# Patient Record
Sex: Male | Born: 1935 | Hispanic: No | Smoking: Former smoker
Health system: Southern US, Community
[De-identification: ages and names within clinical notes are randomized; demographics above are authoritative.]

## PROBLEM LIST (undated history)

## (undated) DIAGNOSIS — I771 Stricture of artery: Secondary | ICD-10-CM

## (undated) DIAGNOSIS — C439 Malignant melanoma of skin, unspecified: Secondary | ICD-10-CM

## (undated) DIAGNOSIS — I10 Essential (primary) hypertension: Secondary | ICD-10-CM

## (undated) DIAGNOSIS — R32 Unspecified urinary incontinence: Secondary | ICD-10-CM

## (undated) DIAGNOSIS — M1990 Unspecified osteoarthritis, unspecified site: Secondary | ICD-10-CM

## (undated) DIAGNOSIS — J449 Chronic obstructive pulmonary disease, unspecified: Secondary | ICD-10-CM

## (undated) DIAGNOSIS — H9190 Unspecified hearing loss, unspecified ear: Secondary | ICD-10-CM

## (undated) DIAGNOSIS — H409 Unspecified glaucoma: Secondary | ICD-10-CM

## (undated) DIAGNOSIS — E119 Type 2 diabetes mellitus without complications: Secondary | ICD-10-CM

## (undated) DIAGNOSIS — L03115 Cellulitis of right lower limb: Secondary | ICD-10-CM

## (undated) DIAGNOSIS — C4362 Malignant melanoma of left upper limb, including shoulder: Secondary | ICD-10-CM

## (undated) DIAGNOSIS — E785 Hyperlipidemia, unspecified: Secondary | ICD-10-CM

## (undated) DIAGNOSIS — N2581 Secondary hyperparathyroidism of renal origin: Secondary | ICD-10-CM

## (undated) DIAGNOSIS — E041 Nontoxic single thyroid nodule: Secondary | ICD-10-CM

## (undated) DIAGNOSIS — L02412 Cutaneous abscess of left axilla: Secondary | ICD-10-CM

## (undated) DIAGNOSIS — N400 Enlarged prostate without lower urinary tract symptoms: Secondary | ICD-10-CM

## (undated) DIAGNOSIS — N183 Chronic kidney disease, stage 3 unspecified: Secondary | ICD-10-CM

## (undated) DIAGNOSIS — D472 Monoclonal gammopathy: Secondary | ICD-10-CM

## (undated) HISTORY — DX: Nontoxic single thyroid nodule: E04.1

## (undated) HISTORY — DX: Malignant melanoma of skin, unspecified: C43.9

## (undated) HISTORY — DX: Cutaneous abscess of left axilla: L02.412

## (undated) HISTORY — DX: Stricture of artery: I77.1

## (undated) HISTORY — PX: OTHER SURGICAL HISTORY: SHX169

## (undated) HISTORY — DX: Unspecified urinary incontinence: R32

## (undated) HISTORY — DX: Essential (primary) hypertension: I10

## (undated) HISTORY — DX: Secondary hyperparathyroidism of renal origin: N25.81

## (undated) HISTORY — DX: Unspecified glaucoma: H40.9

## (undated) HISTORY — DX: Chronic kidney disease, stage 3 (moderate): N18.3

## (undated) HISTORY — DX: Chronic obstructive pulmonary disease, unspecified: J44.9

## (undated) HISTORY — DX: Unspecified hearing loss, unspecified ear: H91.90

## (undated) HISTORY — DX: Monoclonal gammopathy: D47.2

## (undated) HISTORY — DX: Benign prostatic hyperplasia without lower urinary tract symptoms: N40.0

## (undated) HISTORY — DX: Type 2 diabetes mellitus without complications: E11.9

## (undated) HISTORY — DX: Chronic kidney disease, stage 3 unspecified: N18.30

## (undated) HISTORY — DX: Cellulitis of right lower limb: L03.115

## (undated) HISTORY — DX: Unspecified osteoarthritis, unspecified site: M19.90

## (undated) HISTORY — DX: Hyperlipidemia, unspecified: E78.5

## (undated) HISTORY — DX: Malignant melanoma of left upper limb, including shoulder: C43.62

---
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
## 2007-06-29 HISTORY — PX: US ECHOCARDIOGRAPHY: HXRAD669

## 2007-07-29 HISTORY — PX: OTHER SURGICAL HISTORY: SHX169

## 2009-01-31 ENCOUNTER — Ambulatory Visit: Payer: Self-pay | Admitting: Ophthalmology

## 2009-01-31 ENCOUNTER — Ambulatory Visit: Payer: Self-pay | Admitting: Cardiology

## 2009-02-11 ENCOUNTER — Ambulatory Visit: Payer: Self-pay | Admitting: Ophthalmology

## 2011-06-24 ENCOUNTER — Inpatient Hospital Stay: Payer: Self-pay | Admitting: Internal Medicine

## 2011-06-24 LAB — COMPREHENSIVE METABOLIC PANEL
Albumin: 3.1 g/dL — ABNORMAL LOW (ref 3.4–5.0)
Alkaline Phosphatase: 63 U/L (ref 50–136)
BUN: 50 mg/dL — ABNORMAL HIGH (ref 7–18)
Chloride: 114 mmol/L — ABNORMAL HIGH (ref 98–107)
Creatinine: 2.06 mg/dL — ABNORMAL HIGH (ref 0.60–1.30)
EGFR (African American): 40 — ABNORMAL LOW
EGFR (Non-African Amer.): 33 — ABNORMAL LOW
Osmolality: 296 (ref 275–301)
SGOT(AST): 30 U/L (ref 15–37)
SGPT (ALT): 34 U/L
Sodium: 144 mmol/L (ref 136–145)

## 2011-06-24 LAB — CBC
HCT: 32.1 % — ABNORMAL LOW (ref 40.0–52.0)
HGB: 10.6 g/dL — ABNORMAL LOW (ref 13.0–18.0)
MCH: 31.5 pg (ref 26.0–34.0)
MCHC: 33.1 g/dL (ref 32.0–36.0)
MCV: 95 fL (ref 80–100)
RBC: 3.36 10*6/uL — ABNORMAL LOW (ref 4.40–5.90)
RDW: 14.2 % (ref 11.5–14.5)

## 2011-06-24 LAB — URINALYSIS, COMPLETE
Ketone: NEGATIVE
Leukocyte Esterase: NEGATIVE
Nitrite: NEGATIVE
Ph: 5 (ref 4.5–8.0)
WBC UR: 2 /HPF (ref 0–5)

## 2011-06-25 LAB — BASIC METABOLIC PANEL
Anion Gap: 12 (ref 7–16)
Anion Gap: 14 (ref 7–16)
BUN: 45 mg/dL — ABNORMAL HIGH (ref 7–18)
BUN: 38 mg/dL — ABNORMAL HIGH (ref 7–18)
Calcium, Total: 7.5 mg/dL — ABNORMAL LOW (ref 8.5–10.1)
Calcium, Total: 7.8 mg/dL — ABNORMAL LOW (ref 8.5–10.1)
Co2: 15 mmol/L — ABNORMAL LOW (ref 21–32)
Creatinine: 1.9 mg/dL — ABNORMAL HIGH (ref 0.60–1.30)
EGFR (African American): 44 — ABNORMAL LOW
EGFR (African American): 43 — ABNORMAL LOW
Glucose: 36 mg/dL — CL (ref 65–99)
Glucose: 69 mg/dL (ref 65–99)
Osmolality: 295 (ref 275–301)
Osmolality: 289 (ref 275–301)
Potassium: 3 mmol/L — ABNORMAL LOW (ref 3.5–5.1)
Potassium: 3.4 mmol/L — ABNORMAL LOW (ref 3.5–5.1)
Sodium: 144 mmol/L (ref 136–145)
Sodium: 141 mmol/L (ref 136–145)

## 2011-06-25 LAB — CBC WITH DIFFERENTIAL/PLATELET
Basophil %: 0.1 %
Eosinophil %: 0.1 %
HCT: 29 % — ABNORMAL LOW (ref 40.0–52.0)
HGB: 9.5 g/dL — ABNORMAL LOW (ref 13.0–18.0)
Lymphocyte #: 0.8 10*3/uL — ABNORMAL LOW (ref 1.0–3.6)
MCH: 31.4 pg (ref 26.0–34.0)
MCV: 96 fL (ref 80–100)
Monocyte #: 0.8 10*3/uL — ABNORMAL HIGH (ref 0.0–0.7)
Monocyte %: 8.6 %
Neutrophil %: 82.2 %
RBC: 3.01 10*6/uL — ABNORMAL LOW (ref 4.40–5.90)
WBC: 9.1 10*3/uL (ref 3.8–10.6)

## 2011-06-25 LAB — MAGNESIUM: Magnesium: 1.4 mg/dL — ABNORMAL LOW

## 2011-06-26 LAB — CBC WITH DIFFERENTIAL/PLATELET
Basophil #: 0 10*3/uL (ref 0.0–0.1)
Eosinophil #: 0.1 10*3/uL (ref 0.0–0.7)
HCT: 29.7 % — ABNORMAL LOW (ref 40.0–52.0)
HGB: 9.7 g/dL — ABNORMAL LOW (ref 13.0–18.0)
Lymphocyte #: 1 10*3/uL (ref 1.0–3.6)
Lymphocyte %: 13.5 %
MCHC: 32.5 g/dL (ref 32.0–36.0)
MCV: 96 fL (ref 80–100)
Monocyte #: 0.8 10*3/uL — ABNORMAL HIGH (ref 0.0–0.7)
Monocyte %: 10.6 %
Neutrophil #: 5.3 10*3/uL (ref 1.4–6.5)
Platelet: 78 10*3/uL — ABNORMAL LOW (ref 150–440)
RBC: 3.08 10*6/uL — ABNORMAL LOW (ref 4.40–5.90)

## 2011-06-26 LAB — BASIC METABOLIC PANEL
BUN: 33 mg/dL — ABNORMAL HIGH (ref 7–18)
Calcium, Total: 7.7 mg/dL — ABNORMAL LOW (ref 8.5–10.1)
Chloride: 107 mmol/L (ref 98–107)
Co2: 17 mmol/L — ABNORMAL LOW (ref 21–32)
Creatinine: 1.74 mg/dL — ABNORMAL HIGH (ref 0.60–1.30)
EGFR (African American): 48 — ABNORMAL LOW
EGFR (Non-African Amer.): 40 — ABNORMAL LOW
Osmolality: 285 (ref 275–301)
Potassium: 4.2 mmol/L (ref 3.5–5.1)
Sodium: 136 mmol/L (ref 136–145)

## 2011-07-13 LAB — COMPREHENSIVE METABOLIC PANEL
ALT: 16 U/L (ref 10–40)
Albumin: 4.2
Calcium: 9.4 mg/dL
Glucose: 87 mg/dL
Potassium: 4.5 mmol/L
Sodium: 145 mmol/L (ref 137–147)

## 2011-07-13 LAB — CBC
Hemoglobin: 10.1 g/dL — AB (ref 13.5–17.5)
WBC: 4.5

## 2011-07-13 LAB — LIPID PANEL: Cholesterol, Total: 111

## 2011-07-13 LAB — HEMOGLOBIN A1C: Hgb A1c MFr Bld: 6.4 % — AB (ref 4.0–6.0)

## 2011-07-13 LAB — TSH: TSH: 1.98

## 2011-09-10 ENCOUNTER — Encounter: Payer: Self-pay | Admitting: Family Medicine

## 2011-09-10 ENCOUNTER — Ambulatory Visit (INDEPENDENT_AMBULATORY_CARE_PROVIDER_SITE_OTHER): Payer: Medicare Other | Admitting: Family Medicine

## 2011-09-10 VITALS — BP 152/70 | HR 64 | Temp 98.4°F | Ht 68.0 in | Wt 200.2 lb

## 2011-09-10 DIAGNOSIS — E1122 Type 2 diabetes mellitus with diabetic chronic kidney disease: Secondary | ICD-10-CM | POA: Insufficient documentation

## 2011-09-10 DIAGNOSIS — E1121 Type 2 diabetes mellitus with diabetic nephropathy: Secondary | ICD-10-CM | POA: Insufficient documentation

## 2011-09-10 DIAGNOSIS — E785 Hyperlipidemia, unspecified: Secondary | ICD-10-CM | POA: Insufficient documentation

## 2011-09-10 DIAGNOSIS — I340 Nonrheumatic mitral (valve) insufficiency: Secondary | ICD-10-CM | POA: Insufficient documentation

## 2011-09-10 DIAGNOSIS — R011 Cardiac murmur, unspecified: Secondary | ICD-10-CM

## 2011-09-10 DIAGNOSIS — I10 Essential (primary) hypertension: Secondary | ICD-10-CM | POA: Insufficient documentation

## 2011-09-10 DIAGNOSIS — E119 Type 2 diabetes mellitus without complications: Secondary | ICD-10-CM

## 2011-09-10 MED ORDER — AMLODIPINE BESYLATE 10 MG PO TABS: 10.0000 mg | ORAL_TABLET | Freq: Every day | ORAL | Status: DC

## 2011-09-10 MED ORDER — LISINOPRIL-HYDROCHLOROTHIAZIDE 20-25 MG PO TABS: 1.0000 | ORAL_TABLET | Freq: Every day | ORAL | Status: DC

## 2011-09-10 MED ORDER — PRAVASTATIN SODIUM 40 MG PO TABS: 40.0000 mg | ORAL_TABLET | Freq: Every day | ORAL | Status: DC

## 2011-09-10 MED ORDER — DOXAZOSIN MESYLATE 8 MG PO TABS: 8.0000 mg | ORAL_TABLET | Freq: Every day | ORAL | Status: DC

## 2011-09-10 MED ORDER — FUROSEMIDE 20 MG PO TABS: 20.0000 mg | ORAL_TABLET | Freq: Every day | ORAL | Status: DC | PRN

## 2011-09-10 MED ORDER — POTASSIUM CHLORIDE ER 10 MEQ PO TBCR: 10.0000 meq | EXTENDED_RELEASE_TABLET | Freq: Every day | ORAL | Status: DC | PRN

## 2011-09-10 MED ORDER — SAXAGLIPTIN HCL 2.5 MG PO TABS: 2.5000 mg | ORAL_TABLET | Freq: Every day | ORAL | Status: DC

## 2011-09-10 NOTE — Assessment & Plan Note (Signed)
Continue meds.  Above goal. Recheck next month.

## 2011-09-10 NOTE — Assessment & Plan Note (Signed)
Will review records. rtc 1 mo for f/u, consider blood work then. Discussed give onglyza more time to work. Samples provided today, sent med to pharmacy. Off sulfonylurea 2/2 hypoglycemia with syncope

## 2011-09-10 NOTE — Assessment & Plan Note (Signed)
Compliant with and tolerant of statin.

## 2011-09-10 NOTE — Progress Notes (Addendum)
Subjective:    Patient ID: Charles Rodgers, male    DOB: 12/13/1924, 78 y.o.   MRN: [id number]  HPI CC: new pt to establish  Previously saw Dr. Glenis Smoker.  Sees Dr. Wynelle Link (nephrologist).  Brings records from [place] which I will review.  DM - Previously on glimeperide.  Sugars running too low on this.  Recently changed to onglyza.  Now running high - 170s.  This morning 132.  Started onglyza 2 wks ago.  HTN - on several meds for this, reports compliance.  HLD - on pravastatin.  Sounds like has stage 3 CKD as well.  Sees Dr. Wynelle Link, next appt 10/2011.  Recent hospitalization to [facility] (have requested records) - 05/2011 for syncope thought due to hypoglycemia (took 2 days to raise sugar).  Had had prior syncopal episodes (ie when walking dog, bent over and passed out, another episode with hypoglycemia cbg to 38, again fell 2/2 hypoglycemia). ===>reviewed hospitalization - 3/27-29/2013 at [facility] for acute gastroenteritis, dehydration with fall, acute on chronic renal failure, hypoglycemia.  Renal US - medical renal disease.  Prominent prostate.  EKG with first degree block.  cbg 30.  Cr 2.06 which dropped to 1.74.  Hgb 10.6.  Preventative: Last CPE was - unsure. Sees urologist yearly.  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  Past histories reviewed and updated if relevant as below. There is no problem list on file for this patient.  Past Medical History  Diagnosis Date  . Arthritis   . Diabetes mellitus type 2, controlled   . Glaucoma   . HTN (hypertension)   . HLD (hyperlipidemia)   . CKD (chronic kidney disease) stage 3, GFR 30-59 ml/min   . Urine incontinence    Past Surgical History  Procedure Date  . Appendectomy 1938  . Arm surgery     Right after trauma   History  Substance Use  Topics  . Smoking status: Former Smoker    Quit date: 03/31/1967  . Smokeless tobacco: Never Used  . Alcohol Use: No   Family History  Problem Relation Age of Onset  . Cancer Mother     stomach  . Cancer Brother     colon  . Cancer Brother     colon  . Cancer Sister     hodgkin's lymphoma  . Cancer Brother     prostate  . Diabetes Brother   . Coronary artery disease Neg Hx   . Stroke Neg Hx    No Known Allergies Current Outpatient Prescriptions on File Prior to Visit  Medication Sig Dispense Refill  . amLODipine (NORVASC) 10 MG tablet Take 1 tablet (10 mg total) by mouth daily.  90 tablet  3  . doxazosin (CARDURA) 8 MG tablet Take 1 tablet (8 mg total) by mouth at bedtime.  90 tablet  3  . furosemide (LASIX) 20 MG tablet Take 1 tablet (20 mg total) by mouth daily as needed. swelling  30 tablet  3  . lisinopril-hydrochlorothiazide (PRINZIDE,ZESTORETIC) 20-25 MG per tablet Take 1 tablet by mouth daily.  90 tablet  3  . potassium chloride (K-DUR) 10 MEQ tablet Take 1 tablet (10 mEq total) by mouth daily as needed. Take with lasix.  30 tablet  1  . pravastatin (PRAVACHOL) 40 MG tablet Take 1 tablet (40 mg total) by mouth at bedtime.  90  tablet  3  . saxagliptin HCl (ONGLYZA) 2.5 MG TABS tablet Take 1 tablet (2.5 mg total) by mouth daily.  30 tablet  11     Review of Systems  Constitutional: Negative for fever, chills, activity change, appetite change, fatigue and unexpected weight change.  HENT: Negative for hearing loss and neck pain.   Eyes: Negative for visual disturbance.  Respiratory: Negative for cough, chest tightness, shortness of breath and wheezing.   Cardiovascular: Positive for leg swelling. Negative for chest pain and palpitations.  Gastrointestinal: Negative for nausea, vomiting, abdominal pain, diarrhea, constipation, blood in stool and abdominal distention.  Genitourinary: Negative for hematuria and difficulty urinating.  Musculoskeletal: Negative for myalgias  and arthralgias.  Skin: Negative for rash.  Neurological: Positive for syncope. Negative for dizziness, seizures and headaches.  Hematological: Does not bruise/bleed easily.  Psychiatric/Behavioral: Negative for dysphoric mood. The patient is not nervous/anxious.        Objective:   Physical Exam  Nursing note and vitals reviewed. Constitutional: He is oriented to person, place, and time. He appears well-developed and well-nourished. No distress.  HENT:  Head: Normocephalic and atraumatic.  Right Ear: External ear normal.  Left Ear: External ear normal.  Nose: Nose normal.  Mouth/Throat: Oropharynx is clear and moist. No oropharyngeal exudate.  Eyes: Conjunctivae and EOM are normal. Pupils are equal, round, and reactive to light. No scleral icterus.  Neck: Normal range of motion. Neck supple. No thyromegaly present.  Cardiovascular: Normal rate, regular rhythm and intact distal pulses.   Murmur (3/6 SEM best at LUSB, radiates to carotids) heard. Pulses:      Radial pulses are 2+ on the right side, and 2+ on the left side.  Pulmonary/Chest: Effort normal and breath sounds normal. No respiratory distress. He has no wheezes. He has no rales.  Abdominal: Soft. Bowel sounds are normal. He exhibits no distension and no mass. There is no tenderness. There is no rebound and no guarding.  Musculoskeletal: Normal range of motion. He exhibits edema (1+ pitting bilaterally).  Lymphadenopathy:    He has no cervical adenopathy.  Neurological: He is alert and oriented to person, place, and time.       CN grossly intact, station and gait intact  Skin: Skin is warm and dry. No rash noted.  Psychiatric: He has a normal mood and affect. His behavior is normal. Judgment and thought content normal.       Assessment & Plan:

## 2011-09-10 NOTE — Assessment & Plan Note (Signed)
Unsure if evaluated in past.  Will review records he brings today. If no h/o echo, will obtain (in setting of syncope, however anticipate syncope due to hypoglycemia)

## 2011-09-10 NOTE — Patient Instructions (Signed)
Good to meet you today!  Call us with questions. I will review records and call you when ready to pick up. No changes in meds today. I've refilled all your meds today. You do have a bit of a heart murmur today, if continued next visit, we will order ultrasound.

## 2011-09-10 NOTE — Assessment & Plan Note (Signed)
In setting of recent hospitalization with ARF. Will review records and input into chart.. Sees Dr. Wynelle Link regularly

## 2011-09-12 ENCOUNTER — Encounter: Payer: Self-pay | Admitting: Family Medicine

## 2011-09-17 ENCOUNTER — Encounter: Payer: Self-pay | Admitting: Family Medicine

## 2011-10-13 ENCOUNTER — Ambulatory Visit (INDEPENDENT_AMBULATORY_CARE_PROVIDER_SITE_OTHER): Payer: Medicare Other | Admitting: Family Medicine

## 2011-10-13 ENCOUNTER — Encounter: Payer: Self-pay | Admitting: Family Medicine

## 2011-10-13 VITALS — BP 140/70 | HR 68 | Temp 98.3°F | Wt 194.5 lb

## 2011-10-13 DIAGNOSIS — R011 Cardiac murmur, unspecified: Secondary | ICD-10-CM

## 2011-10-13 DIAGNOSIS — E785 Hyperlipidemia, unspecified: Secondary | ICD-10-CM

## 2011-10-13 DIAGNOSIS — E119 Type 2 diabetes mellitus without complications: Secondary | ICD-10-CM

## 2011-10-13 DIAGNOSIS — Z23 Encounter for immunization: Secondary | ICD-10-CM

## 2011-10-13 DIAGNOSIS — N183 Chronic kidney disease, stage 3 unspecified: Secondary | ICD-10-CM

## 2011-10-13 DIAGNOSIS — I10 Essential (primary) hypertension: Secondary | ICD-10-CM

## 2011-10-13 LAB — MICROALBUMIN / CREATININE URINE RATIO: Microalb Creat Ratio: 1 mg/g (ref 0.0–30.0)

## 2011-10-13 NOTE — Assessment & Plan Note (Signed)
Chronic, stable. Continue med. 

## 2011-10-13 NOTE — Assessment & Plan Note (Signed)
Not significant today.  Continue to monitor.

## 2011-10-13 NOTE — Assessment & Plan Note (Signed)
Followed by renal.  Has appt pending next month.  Will fax blood work results to [facility].

## 2011-10-13 NOTE — Addendum Note (Signed)
Addended by: Josph Macho A on: 10/13/2011 01:51 PM   Modules accepted: Orders

## 2011-10-13 NOTE — Assessment & Plan Note (Signed)
Chronic, improved. Continue med. Given swelling, advised to take lasix daily with potassium.  Check BMP today.

## 2011-10-13 NOTE — Patient Instructions (Signed)
Change lasix to daily, take with 10 mEq of potassium daily as well. Blood work today to check on sugar. Good to see you today, call us with quesitons. Return in

## 2011-10-13 NOTE — Assessment & Plan Note (Signed)
Chronic. Anticipate deteriorated given off sulfonylurea 2/2 hypoglycemic syncope Not metformin candidate 2/2 CKD. Sees podiatrist and ophtho regularly. Check A1c today.

## 2011-10-13 NOTE — Progress Notes (Signed)
  Subjective:    Patient ID: Charles Rodgers, male    DOB: 1925-03-29, 78 y.o.   MRN: [id number]  HPI CC: 1 mo f/u  DM - at night running as high as 200, then in am about 130-140.  Sees eye doctor every 6 mo, due 10/21/2011.  No low sugars.  No paresthesias.  Sees podiatrist (Dr. Kennith Center).  Pending appt this month. Lab Results  Component Value Date   HGBA1C 6.4* 07/13/2011    BPH - nocturia x1-2.  Voiding well.  Takes doxasozin daily.  HTN - No HA, vision changes, CP/tightness, SOB, leg swelling.  Compliant with meds.  HLD - compliant with pravastatin, no myalgias.  Cardiac murmur - some weakness with exertion.  No chest pain or shortness of breath.  No recent echo.  CKD - sees Dr. Wynelle Link.  F/u in August.  Past Medical History  Diagnosis Date  . Arthritis   . Diabetes mellitus type 2, controlled   . Glaucoma   . HTN (hypertension)   . HLD (hyperlipidemia)   . CKD (chronic kidney disease) stage 3, GFR 30-59 ml/min   . Urine incontinence   . BPH (benign prostatic hypertrophy)     s/p biopsy for possible prostate cancer, followed yearly by [facility]   Denies falls in last year.  Did have passing out episodes recently, attributed to hypoglycemia.  Resolved with looser glycemic control. Denies depression, anhedonia.  Review of Systems Per HPI    Objective:   Physical Exam  Nursing note and vitals reviewed. Constitutional: He appears well-developed and well-nourished. No distress.  HENT:  Head: Normocephalic and atraumatic.  Mouth/Throat: Oropharynx is clear and moist. No oropharyngeal exudate.  Eyes: Conjunctivae and EOM are normal. Pupils are equal, round, and reactive to light.  Neck: Normal range of motion. Neck supple. Carotid bruit is not present.  Cardiovascular: Normal rate, regular rhythm, normal heart sounds and intact distal pulses.   No murmur heard.      No murmur appreciated today  Pulmonary/Chest: Effort normal and breath sounds normal. No respiratory  distress. He has no wheezes. He has no rales.  Musculoskeletal: He exhibits edema (trace pitting ).  Lymphadenopathy:    He has no cervical adenopathy.  Skin: Skin is warm and dry. No rash noted.  Psychiatric: He has a normal mood and affect.      Assessment & Plan:

## 2011-10-14 LAB — BASIC METABOLIC PANEL
BUN: 39 mg/dL — ABNORMAL HIGH (ref 6–23)
CO2: 20 mEq/L (ref 19–32)
Calcium: 9.4 mg/dL (ref 8.4–10.5)
Creatinine, Ser: 1.9 mg/dL — ABNORMAL HIGH (ref 0.4–1.5)
GFR: 35.99 mL/min — ABNORMAL LOW (ref >60.00)
Glucose, Bld: 118 mg/dL — ABNORMAL HIGH (ref 70–99)
Sodium: 139 mEq/L (ref 135–145)

## 2011-10-20 ENCOUNTER — Telehealth: Payer: Self-pay | Admitting: *Deleted

## 2011-10-20 NOTE — Telephone Encounter (Signed)
Patient called and asked that I remind you about his K+ pill. He said he passes it in his stool and he is concerned that it is still appearing whole. Any suggestions? He mentioned it at his appt, but then got sidetracked talking about something else with you and forgot to mention it again.

## 2011-10-22 NOTE — Telephone Encounter (Signed)
On lasix 20mg  daily, on potassium 10 mEq daily. Lab Results  Component Value Date   K 5.0 10/13/2011   Lab Results  Component Value Date   CREATININE 1.9* 10/13/2011   is his potassium a capsule?  If so, sometimes you can see casing of pills in stool, but medicine in them has already been absorbed.  So I wouldn't worry about this.

## 2011-10-22 NOTE — Telephone Encounter (Signed)
Spoke with patient and he said he is taking a tablet, not a capsule. Anymore ideas?

## 2011-10-23 NOTE — Telephone Encounter (Signed)
This can happen with tablets as well.  I wouldn't worry about this.  Something called "ghost tablets".

## 2011-10-26 NOTE — Telephone Encounter (Signed)
Patient notified

## 2011-11-12 ENCOUNTER — Encounter: Payer: Self-pay | Admitting: Family Medicine

## 2012-01-15 ENCOUNTER — Encounter: Payer: Self-pay | Admitting: Family Medicine

## 2012-01-15 ENCOUNTER — Ambulatory Visit (INDEPENDENT_AMBULATORY_CARE_PROVIDER_SITE_OTHER): Payer: Medicare Other | Admitting: Family Medicine

## 2012-01-15 VITALS — BP 124/66 | HR 68 | Temp 97.7°F | Wt 200.8 lb

## 2012-01-15 DIAGNOSIS — N401 Enlarged prostate with lower urinary tract symptoms: Secondary | ICD-10-CM | POA: Insufficient documentation

## 2012-01-15 DIAGNOSIS — E1142 Type 2 diabetes mellitus with diabetic polyneuropathy: Secondary | ICD-10-CM

## 2012-01-15 DIAGNOSIS — N400 Enlarged prostate without lower urinary tract symptoms: Secondary | ICD-10-CM

## 2012-01-15 DIAGNOSIS — Z23 Encounter for immunization: Secondary | ICD-10-CM

## 2012-01-15 DIAGNOSIS — H9190 Unspecified hearing loss, unspecified ear: Secondary | ICD-10-CM

## 2012-01-15 DIAGNOSIS — E1149 Type 2 diabetes mellitus with other diabetic neurological complication: Secondary | ICD-10-CM

## 2012-01-15 DIAGNOSIS — I10 Essential (primary) hypertension: Secondary | ICD-10-CM

## 2012-01-15 DIAGNOSIS — N183 Chronic kidney disease, stage 3 unspecified: Secondary | ICD-10-CM

## 2012-01-15 DIAGNOSIS — E785 Hyperlipidemia, unspecified: Secondary | ICD-10-CM

## 2012-01-15 LAB — BASIC METABOLIC PANEL
BUN: 36 mg/dL — ABNORMAL HIGH (ref 6–23)
Chloride: 108 mEq/L (ref 96–112)
GFR: 37.8 mL/min — ABNORMAL LOW (ref >60.00)
Glucose, Bld: 115 mg/dL — ABNORMAL HIGH (ref 70–99)
Potassium: 4.6 mEq/L (ref 3.5–5.1)
Sodium: 139 mEq/L (ref 135–145)

## 2012-01-15 NOTE — Assessment & Plan Note (Signed)
Chronic, stable. Great control - continue meds. 

## 2012-01-15 NOTE — Assessment & Plan Note (Signed)
Chronic, stable.    Lab Results  Component Value Date   HGBA1C 6.8* 10/13/2011  Continue meds. Sees podiatry and ophtho regularly.

## 2012-01-15 NOTE — Progress Notes (Signed)
  Subjective:    Patient ID: Charles Rodgers, male    DOB: Dec 03, 1924, 78 y.o.   MRN: [id number]  HPI CC: 3 mo f/u  DM - this morning fasting 114.  Night time sugars 160s.  No low sugars.  No hypoglycemic sxs.  Denies paresthesias.  ophtho exam 11/2011.  Foot exam today.  Sees podiatrist every 3 months. Lab Results  Component Value Date   HGBA1C 6.8* 10/13/2011    HTN - great control on current regimen.  No HA, vision changes, CP/tightness, SOB, leg swelling.   HLD - no myalgias.  CKD - sees Dr. Wynelle Link for stage 3 CKD.  Flu and tetanus shot today.  Review of Systems Per HPI    Objective:   Physical Exam  Nursing note and vitals reviewed. Constitutional: He appears well-developed and well-nourished. No distress.  HENT:  Head: Normocephalic and atraumatic.  Right Ear: External ear normal.  Left Ear: External ear normal.  Nose: Nose normal.  Mouth/Throat: Oropharynx is clear and moist. No oropharyngeal exudate.  Eyes: Conjunctivae normal and EOM are normal. Pupils are equal, round, and reactive to light. No scleral icterus.  Neck: Normal range of motion. Neck supple. Carotid bruit is not present.  Cardiovascular: Normal rate, regular rhythm, normal heart sounds and intact distal pulses.   No murmur heard. Pulmonary/Chest: Effort normal and breath sounds normal. No respiratory distress. He has no wheezes. He has no rales.  Musculoskeletal: He exhibits no edema.       Diabetic foot exam: Normal inspection No skin breakdown No calluses  Normal DP/PT pulses Normal sensation to light touch and diminished to monofilament Nails elongated  Lymphadenopathy:    He has no cervical adenopathy.  Skin: Skin is warm and dry. No rash noted.  Psychiatric: He has a normal mood and affect.       Assessment & Plan:

## 2012-01-15 NOTE — Assessment & Plan Note (Signed)
Recheck Cr today.  Sees [facility] regularly.

## 2012-01-15 NOTE — Assessment & Plan Note (Signed)
Chronic. No myalgias. continue med.

## 2012-01-15 NOTE — Patient Instructions (Addendum)
Flu shot today. As well as tetanus. Blood work today. We will check on samples for onglyza Return in 4 months for medicare wellness, prior fasting for blood work.

## 2012-01-15 NOTE — Addendum Note (Signed)
Addended by: Josph Macho A on: 01/15/2012 09:42 AM   Modules accepted: Orders

## 2012-03-30 DIAGNOSIS — C439 Malignant melanoma of skin, unspecified: Secondary | ICD-10-CM

## 2012-03-30 DIAGNOSIS — I771 Stricture of artery: Secondary | ICD-10-CM

## 2012-03-30 HISTORY — DX: Stricture of artery: I77.1

## 2012-03-30 HISTORY — DX: Malignant melanoma of skin, unspecified: C43.9

## 2012-03-31 ENCOUNTER — Encounter: Payer: Self-pay | Admitting: Family Medicine

## 2012-03-31 ENCOUNTER — Ambulatory Visit (INDEPENDENT_AMBULATORY_CARE_PROVIDER_SITE_OTHER): Payer: Medicare Other | Admitting: Family Medicine

## 2012-03-31 VITALS — BP 122/58 | HR 64 | Temp 98.5°F | Wt 201.0 lb

## 2012-03-31 DIAGNOSIS — J069 Acute upper respiratory infection, unspecified: Secondary | ICD-10-CM

## 2012-03-31 MED ORDER — BENZONATATE 200 MG PO CAPS: 200.0000 mg | ORAL_CAPSULE | Freq: Three times a day (TID) | ORAL | Status: AC | PRN

## 2012-03-31 NOTE — Progress Notes (Signed)
Cough and congestion.  Rhinorrhea.  Started about 1 week ago.  Is getting some better.  Nonsmoker now, quit 40 + years ago.  Sugar has been controlled per patient.  No sputum, dry cough.  No fevers.  No ear pain.  ST.  "I don't feel bad but my wife's family wanted me checked out."    Meds, vitals, and allergies reviewed.   ROS: See HPI.  Otherwise, noncontributory.  GEN: nad, alert and oriented HEENT: mucous membranes moist, tm w/o erythema, nasal exam w/o erythema, clear discharge noted,  OP with cobblestoning NECK: supple w/o LA CV: rrr.   PULM: completely ctab, no inc wob EXT: no edema

## 2012-03-31 NOTE — Patient Instructions (Signed)
Take the tessalon for the cough if needed.  Keep the appointment with the skin doctor at the end of the month.

## 2012-04-01 DIAGNOSIS — J069 Acute upper respiratory infection, unspecified: Secondary | ICD-10-CM | POA: Insufficient documentation

## 2012-04-01 NOTE — Assessment & Plan Note (Signed)
Nontoxic, should resolve.  Likely viral.  Reassured.   Incidental scabbed lesion on dorsum on L hand- he has f/u with derm at the end of the month.

## 2012-05-08 ENCOUNTER — Other Ambulatory Visit: Payer: Self-pay | Admitting: Family Medicine

## 2012-05-08 DIAGNOSIS — I10 Essential (primary) hypertension: Secondary | ICD-10-CM

## 2012-05-08 DIAGNOSIS — E785 Hyperlipidemia, unspecified: Secondary | ICD-10-CM

## 2012-05-08 DIAGNOSIS — E1142 Type 2 diabetes mellitus with diabetic polyneuropathy: Secondary | ICD-10-CM

## 2012-05-10 ENCOUNTER — Other Ambulatory Visit (INDEPENDENT_AMBULATORY_CARE_PROVIDER_SITE_OTHER): Payer: Medicare Other

## 2012-05-10 DIAGNOSIS — E1149 Type 2 diabetes mellitus with other diabetic neurological complication: Secondary | ICD-10-CM

## 2012-05-10 DIAGNOSIS — E1142 Type 2 diabetes mellitus with diabetic polyneuropathy: Secondary | ICD-10-CM

## 2012-05-10 DIAGNOSIS — E785 Hyperlipidemia, unspecified: Secondary | ICD-10-CM

## 2012-05-10 LAB — LIPID PANEL
Cholesterol: 97 mg/dL (ref 0–200)
LDL Cholesterol: 52 mg/dL (ref 0–99)
Total CHOL/HDL Ratio: 3
Triglycerides: 83 mg/dL (ref 0.0–149.0)
VLDL: 16.6 mg/dL (ref 0.0–40.0)

## 2012-05-10 LAB — RENAL FUNCTION PANEL
Albumin: 3.6 g/dL (ref 3.5–5.2)
GFR: 37.77 mL/min — ABNORMAL LOW (ref >60.00)
Glucose, Bld: 118 mg/dL — ABNORMAL HIGH (ref 70–99)
Phosphorus: 3.5 mg/dL (ref 2.3–4.6)
Potassium: 4.7 mEq/L (ref 3.5–5.1)
Sodium: 138 mEq/L (ref 135–145)

## 2012-05-10 LAB — CBC WITH DIFFERENTIAL/PLATELET
Basophils Absolute: 0 10*3/uL (ref 0.0–0.1)
Eosinophils Absolute: 0.1 10*3/uL (ref 0.0–0.7)
Lymphocytes Relative: 19.8 % (ref 12.0–46.0)
MCHC: 32.5 g/dL (ref 30.0–36.0)
Neutrophils Relative %: 68.1 % (ref 43.0–77.0)
Platelets: 66 10*3/uL — ABNORMAL LOW (ref 150.0–400.0)
RDW: 15 % — ABNORMAL HIGH (ref 11.5–14.6)

## 2012-05-10 LAB — HEMOGLOBIN A1C: Hgb A1c MFr Bld: 6.7 % — ABNORMAL HIGH (ref 4.6–6.5)

## 2012-05-11 LAB — PTH, INTACT AND CALCIUM
Calcium, Total (PTH): 9.2 mg/dL (ref 8.4–10.5)
PTH: 94.9 pg/mL — ABNORMAL HIGH (ref 14.0–72.0)

## 2012-05-15 DIAGNOSIS — H409 Unspecified glaucoma: Secondary | ICD-10-CM | POA: Insufficient documentation

## 2012-05-17 ENCOUNTER — Encounter: Payer: Self-pay | Admitting: Family Medicine

## 2012-05-17 ENCOUNTER — Ambulatory Visit (INDEPENDENT_AMBULATORY_CARE_PROVIDER_SITE_OTHER): Payer: Medicare Other | Admitting: Family Medicine

## 2012-05-17 VITALS — BP 126/74 | HR 72 | Temp 97.9°F | Ht 68.25 in | Wt 195.8 lb

## 2012-05-17 DIAGNOSIS — H409 Unspecified glaucoma: Secondary | ICD-10-CM

## 2012-05-17 DIAGNOSIS — E1149 Type 2 diabetes mellitus with other diabetic neurological complication: Secondary | ICD-10-CM

## 2012-05-17 DIAGNOSIS — H9190 Unspecified hearing loss, unspecified ear: Secondary | ICD-10-CM

## 2012-05-17 DIAGNOSIS — N039 Chronic nephritic syndrome with unspecified morphologic changes: Secondary | ICD-10-CM

## 2012-05-17 DIAGNOSIS — R011 Cardiac murmur, unspecified: Secondary | ICD-10-CM

## 2012-05-17 DIAGNOSIS — Z0000 Encounter for general adult medical examination without abnormal findings: Secondary | ICD-10-CM

## 2012-05-17 DIAGNOSIS — E785 Hyperlipidemia, unspecified: Secondary | ICD-10-CM

## 2012-05-17 DIAGNOSIS — R0989 Other specified symptoms and signs involving the circulatory and respiratory systems: Secondary | ICD-10-CM

## 2012-05-17 DIAGNOSIS — D696 Thrombocytopenia, unspecified: Secondary | ICD-10-CM

## 2012-05-17 DIAGNOSIS — N189 Chronic kidney disease, unspecified: Secondary | ICD-10-CM

## 2012-05-17 DIAGNOSIS — D631 Anemia in chronic kidney disease: Secondary | ICD-10-CM

## 2012-05-17 DIAGNOSIS — I10 Essential (primary) hypertension: Secondary | ICD-10-CM

## 2012-05-17 DIAGNOSIS — E1142 Type 2 diabetes mellitus with diabetic polyneuropathy: Secondary | ICD-10-CM

## 2012-05-17 LAB — CBC WITH DIFFERENTIAL/PLATELET
Basophils Absolute: 0 10*3/uL (ref 0.0–0.1)
HCT: 29.7 % — ABNORMAL LOW (ref 39.0–52.0)
Lymphs Abs: 1 10*3/uL (ref 0.7–4.0)
Monocytes Relative: 8.6 % (ref 3.0–12.0)
Platelets: 63 10*3/uL — ABNORMAL LOW (ref 150.0–400.0)
RDW: 14.9 % — ABNORMAL HIGH (ref 11.5–14.6)

## 2012-05-17 NOTE — Patient Instructions (Addendum)
Avoid sodas and highly processed foods (recommend low phosphate diet).  More water.  More fruits/vegetables. Call your insurance about the shingles shot to see if it is covered or how much it would cost and where is cheaper (here or pharmacy).  If you want to receive here, call for nurse visit. Advanced directives packet provided today. Pass by Marion's office today to set up carotid ultrasound. Good to see you today, call us with questions. Blood work today.

## 2012-05-17 NOTE — Progress Notes (Signed)
Subjective:    Patient ID: Charles Rodgers, male    DOB: 1924/09/23, 79 y.o.   MRN: [id number]  HPI CC: medicare wellness visit  DM - sugars running well.  114 this morning.  Occasional elevated readings at night, rarely >200.    To see Dr. Adriana Simas in [facility] for possible skin cancers on nose and L hand.  Hearing screen - deferred as uses hearing aides.  Trouble understanding wife. Vision screen - failed.  Next eye doctor appt is 06/21/2012.  Known glaucoma.  Known R visual loss. Denies depression, anhedonia or falls.  Preventative:  Sees urologist yearly (Dr Achilles Dunk).  flu shot received Td 12/2011 Pneumovax 09/2011 Shingles shot - discussed, will check with insurance. Advanced directives - would like wife to make medical decisions.  Declines prolonged life support.  Wouldn't want to be kept alive if <50% chance would survive.  Requests packet of information today.  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  Medications and allergies reviewed and updated in chart.  Past histories reviewed and updated if relevant as below. Patient Active Problem List  Diagnosis  . Well controlled type 2 diabetes mellitus with peripheral neuropathy  . HTN (hypertension)  . HLD (hyperlipidemia)  . CKD (chronic kidney disease) stage 3, GFR 30-59 ml/min  . Cardiac murmur  . Hearing loss  . BPH (benign prostatic hypertrophy)  . URI (upper respiratory infection)  . Glaucoma   Past Medical History  Diagnosis Date  . Arthritis   . Diabetes mellitus type 2, controlled   . Glaucoma   . HTN (hypertension)   . HLD (hyperlipidemia)   . CKD (chronic kidney disease) stage 3, GFR 30-59 ml/min     [facility]  . Urine incontinence   . BPH (benign prostatic hypertrophy)     s/p biopsy for possible prostate cancer, followed yearly by [facility]  .  Secondary hyperparathyroidism   . Hearing loss   . Melanoma 2014    nose and left hand   Past Surgical History  Procedure Laterality Date  . Appendectomy  1938  . Arm surgery      Right after trauma  . US echocardiography  06/2007    EF 55%, mod dil LA, mild LVH,mild pulm HTN  . Carotid US  07/2007    WNL   History  Substance Use Topics  . Smoking status: Former Smoker    Quit date: 03/31/1967  . Smokeless tobacco: Never Used  . Alcohol Use: No   Family History  Problem Relation Age of Onset  . Cancer Mother     stomach  . Cancer Brother     colon  . Cancer Brother     colon  . Cancer Sister     hodgkin's lymphoma  . Cancer Brother     prostate  . Diabetes Brother   . Coronary artery disease Neg Hx   . Stroke Neg Hx    No Known Allergies Current Outpatient Prescriptions on File Prior to Visit  Medication Sig Dispense Refill  . amLODipine (NORVASC) 10 MG tablet Take 1 tablet (10 mg total) by mouth daily.  90 tablet  3  . Blood Glucose Monitoring Suppl (ONE TOUCH ULTRA 2) W/DEVICE KIT       . brimonidine (ALPHAGAN) 0.2 % ophthalmic solution Place 1 drop into both eyes 2 (two) times daily.      Marland Kitchen  Calcium Carbonate-Vitamin D (CALTRATE 600+D PO) Take by mouth daily.      Jennette Banker Sodium 30-100 MG CAPS Take by mouth daily.      Marland Kitchen doxazosin (CARDURA) 8 MG tablet Take 1 tablet (8 mg total) by mouth at bedtime.  90 tablet  3  . fish oil-omega-3 fatty acids 1000 MG capsule Take 2 g by mouth daily.      . furosemide (LASIX) 20 MG tablet Take 20 mg by mouth daily.      Marland Kitchen lisinopril-hydrochlorothiazide (PRINZIDE,ZESTORETIC) 20-25 MG per tablet Take 1 tablet by mouth daily.  90 tablet  3  . Multiple Vitamins-Minerals (PRESERVISION AREDS) CAPS Take 2 capsules by mouth daily.      . ONE TOUCH ULTRA TEST test strip       . ONETOUCH DELICA LANCETS MISC Use as directed      . potassium chloride (K-DUR) 10 MEQ tablet Take 10 mEq by mouth daily. Take with lasix.      Marland Kitchen  pravastatin (PRAVACHOL) 40 MG tablet Take 1 tablet (40 mg total) by mouth at bedtime.  90 tablet  3  . saxagliptin HCl (ONGLYZA) 2.5 MG TABS tablet Take 1 tablet (2.5 mg total) by mouth daily.  30 tablet  11  . timolol (BETIMOL) 0.5 % ophthalmic solution Place 1 drop into both eyes 2 (two) times daily.      . travoprost, benzalkonium, (TRAVATAN) 0.004 % ophthalmic solution Place 1 drop into both eyes at bedtime.       No current facility-administered medications on file prior to visit.     Review of Systems  Constitutional: Negative for fever, chills, activity change, appetite change, fatigue and unexpected weight change.  HENT: Positive for hearing loss. Negative for neck pain.   Eyes: Negative for visual disturbance.  Respiratory: Negative for cough, chest tightness, shortness of breath and wheezing.   Cardiovascular: Negative for chest pain, palpitations and leg swelling.  Gastrointestinal: Negative for nausea, vomiting, abdominal pain, diarrhea, constipation, blood in stool and abdominal distention.  Genitourinary: Negative for hematuria and difficulty urinating.  Musculoskeletal: Negative for myalgias and arthralgias.  Skin: Negative for rash.  Neurological: Negative for dizziness, seizures, syncope and headaches.  Hematological: Does not bruise/bleed easily.  Psychiatric/Behavioral: Negative for dysphoric mood. The patient is not nervous/anxious.        Objective:   Physical Exam  Nursing note and vitals reviewed. Constitutional: He is oriented to person, place, and time. He appears well-developed and well-nourished. No distress.  HENT:  Head: Normocephalic and atraumatic.  Right Ear: Hearing, tympanic membrane, external ear and ear canal normal.  Left Ear: Hearing, tympanic membrane, external ear and ear canal normal.  Nose: Nose normal.  Mouth/Throat: Oropharynx is clear and moist. No oropharyngeal exudate.  Eyes: Conjunctivae and EOM are normal. Pupils are equal, round, and  reactive to light. No scleral icterus.  Neck: Normal range of motion. Neck supple. Carotid bruit is present (faint R).  Cardiovascular: Normal rate, regular rhythm, normal heart sounds and intact distal pulses.   No murmur heard. Pulses:      Radial pulses are 2+ on the right side, and 2+ on the left side.  Pulmonary/Chest: Effort normal and breath sounds normal. No respiratory distress. He has no wheezes. He has no rales.  Abdominal: Soft. Bowel sounds are normal. He exhibits no distension and no mass. There is no tenderness. There is no rebound and no guarding.  Musculoskeletal: Normal range of motion. He exhibits edema (R 1+, L  tr).  Lymphadenopathy:    He has no cervical adenopathy.  Neurological: He is alert and oriented to person, place, and time.  CN grossly intact, station and gait intact  Skin: Skin is warm and dry. No rash noted.  Psychiatric: He has a normal mood and affect. His behavior is normal. Judgment and thought content normal.       Assessment & Plan:

## 2012-05-18 ENCOUNTER — Encounter: Payer: Self-pay | Admitting: Family Medicine

## 2012-05-18 DIAGNOSIS — Z0000 Encounter for general adult medical examination without abnormal findings: Secondary | ICD-10-CM | POA: Insufficient documentation

## 2012-05-18 DIAGNOSIS — D696 Thrombocytopenia, unspecified: Secondary | ICD-10-CM | POA: Insufficient documentation

## 2012-05-18 DIAGNOSIS — D631 Anemia in chronic kidney disease: Secondary | ICD-10-CM | POA: Insufficient documentation

## 2012-05-18 LAB — PATHOLOGIST SMEAR REVIEW

## 2012-05-18 NOTE — Assessment & Plan Note (Signed)
Wears hearing aid on left.

## 2012-05-18 NOTE — Assessment & Plan Note (Signed)
Chronic, stable. Continue regimen.  Tolerating onglyza. Lab Results  Component Value Date   HGBA1C 6.7* 05/10/2012

## 2012-05-18 NOTE — Assessment & Plan Note (Signed)
Noted <10 today.  Has upcoming appt with renal next month.  wil forward today's blood work.

## 2012-05-18 NOTE — Assessment & Plan Note (Signed)
Stable.  Followed by [facility]. Mild 2d hyperparathyroidism. On cal/ vit D.  May recommend switch to only vit D in future. Discussed low phosphate diet.

## 2012-05-18 NOTE — Assessment & Plan Note (Signed)
To see ophtho next month.

## 2012-05-18 NOTE — Assessment & Plan Note (Signed)
Not appreciated today.  

## 2012-05-18 NOTE — Assessment & Plan Note (Signed)
I have personally reviewed the Medicare Annual Wellness questionnaire and have noted 1. The patient's medical and social history 2. Their use of alcohol, tobacco or illicit drugs 3. Their current medications and supplements 4. The patient's functional ability including ADL's, fall risks, home safety risks and hearing or visual impairment. 5. Diet and physical activity 6. Evidence for depression or mood disorders The patients weight, height, BMI have been recorded in the chart.  Hearing and vision has been addressed. I have made referrals, counseling and provided education to the patient based review of the above and I have provided the pt with a written personalized care plan for preventive services. See scanned questionairre. Advanced directives discussed: would want wife to be HCPOA.  Provided with packet of information today.  Reviewed preventative protocols and updated unless pt declined. UTD immunizations - will check into shingles shot. Followed by Dr. Achilles Dunk Urologist for BPH.

## 2012-05-18 NOTE — Assessment & Plan Note (Signed)
Chronic, stable on pravastatin.  Low HDL noted - encouraged increased regular aerobic exercise.

## 2012-05-18 NOTE — Assessment & Plan Note (Addendum)
New.  Check periph smear and repeat CBC today. Merits monitoring - may need to r/o myelodysplastic syndrome given age (vs ITP or B12/folate def)

## 2012-05-18 NOTE — Assessment & Plan Note (Signed)
Chronic, stable. Continue meds. BP Readings from Last 3 Encounters:  05/17/12 126/74  03/31/12 122/58  01/15/12 124/66

## 2012-05-18 NOTE — Assessment & Plan Note (Signed)
Unsure if has been heard in past. Will obtain carotid US - discussed anticipated need for regular monitoring of this with patient.

## 2012-05-22 ENCOUNTER — Other Ambulatory Visit: Payer: Self-pay | Admitting: Family Medicine

## 2012-05-22 DIAGNOSIS — D696 Thrombocytopenia, unspecified: Secondary | ICD-10-CM

## 2012-06-06 LAB — RENAL FUNCTION PANEL
BUN: 40 mg/dL — AB (ref 4–21)
Potassium: 4.6 mmol/L
Sodium: 139 mmol/L (ref 137–147)

## 2012-06-09 ENCOUNTER — Encounter (INDEPENDENT_AMBULATORY_CARE_PROVIDER_SITE_OTHER): Payer: Medicare Other

## 2012-06-09 DIAGNOSIS — I6529 Occlusion and stenosis of unspecified carotid artery: Secondary | ICD-10-CM

## 2012-06-09 DIAGNOSIS — R0989 Other specified symptoms and signs involving the circulatory and respiratory systems: Secondary | ICD-10-CM

## 2012-06-14 ENCOUNTER — Encounter: Payer: Self-pay | Admitting: Family Medicine

## 2012-06-16 ENCOUNTER — Other Ambulatory Visit: Payer: Self-pay | Admitting: Family Medicine

## 2012-06-16 DIAGNOSIS — R0989 Other specified symptoms and signs involving the circulatory and respiratory systems: Secondary | ICD-10-CM

## 2012-06-16 DIAGNOSIS — I771 Stricture of artery: Secondary | ICD-10-CM

## 2012-06-16 DIAGNOSIS — E041 Nontoxic single thyroid nodule: Secondary | ICD-10-CM | POA: Insufficient documentation

## 2012-06-16 DIAGNOSIS — E079 Disorder of thyroid, unspecified: Secondary | ICD-10-CM

## 2012-06-20 ENCOUNTER — Other Ambulatory Visit (INDEPENDENT_AMBULATORY_CARE_PROVIDER_SITE_OTHER): Payer: Medicare Other

## 2012-06-20 DIAGNOSIS — D696 Thrombocytopenia, unspecified: Secondary | ICD-10-CM

## 2012-06-20 LAB — FOLATE: Folate: 23.9 ng/mL (ref >5.9)

## 2012-06-20 LAB — CBC WITH DIFFERENTIAL/PLATELET
Eosinophils Relative: 1.7 % (ref 0.0–5.0)
HCT: 29.5 % — ABNORMAL LOW (ref 39.0–52.0)
Lymphs Abs: 1 10*3/uL (ref 0.7–4.0)
MCV: 94.6 fl (ref 78.0–100.0)
Monocytes Absolute: 0.6 10*3/uL (ref 0.1–1.0)
Platelets: 71 10*3/uL — ABNORMAL LOW (ref 150.0–400.0)
WBC: 6.9 10*3/uL (ref 4.5–10.5)

## 2012-06-23 ENCOUNTER — Ambulatory Visit: Payer: Self-pay | Admitting: Family Medicine

## 2012-06-23 ENCOUNTER — Encounter: Payer: Self-pay | Admitting: Family Medicine

## 2012-06-29 ENCOUNTER — Encounter: Payer: Self-pay | Admitting: Family Medicine

## 2012-07-11 ENCOUNTER — Encounter: Payer: Self-pay | Admitting: Family Medicine

## 2012-07-28 HISTORY — PX: MOHS SURGERY: SUR867

## 2012-08-10 ENCOUNTER — Encounter: Payer: Self-pay | Admitting: Family Medicine

## 2012-09-08 LAB — COMPREHENSIVE METABOLIC PANEL
Calcium: 9 mg/dL
EGFR: 31 mg/dL
Potassium: 4.9 mmol/L

## 2012-09-08 LAB — LIPID PANEL
Cholesterol: 86 mg/dL (ref 0–200)
Direct LDL: 41

## 2012-09-08 LAB — CBC: HGB: 10.1 g/dL

## 2012-09-08 LAB — VITAMIN D 25 HYDROXY (VIT D DEFICIENCY, FRACTURES): Vit D, 25-Hydroxy: 29.6

## 2012-09-12 ENCOUNTER — Telehealth: Payer: Self-pay | Admitting: *Deleted

## 2012-09-12 NOTE — Telephone Encounter (Signed)
I don't see that he's ever been on this from our office-  Can we call and verify with patient whether he's taking or not? Lab Results  Component Value Date   HGBA1C 6.7* 05/10/2012

## 2012-09-12 NOTE — Telephone Encounter (Signed)
Received a faxed refill request from pharmacy for Glimepiride 2 mg #90, take one by mouth once a day. Medication is not on med sheet. Please advise.

## 2012-09-13 NOTE — Telephone Encounter (Signed)
Spoke with patient and he said he is NOT taking this medication. He is not sure where this request came from but he said to disregard it because all he takes is the Onglyza.

## 2012-09-23 ENCOUNTER — Other Ambulatory Visit: Payer: Self-pay | Admitting: Family Medicine

## 2012-09-23 MED ORDER — SAXAGLIPTIN HCL 2.5 MG PO TABS: 2.5000 mg | ORAL_TABLET | Freq: Every day | ORAL | Status: DC

## 2012-09-23 MED ORDER — PRAVASTATIN SODIUM 40 MG PO TABS: 40.0000 mg | ORAL_TABLET | Freq: Every day | ORAL | Status: DC

## 2012-09-26 ENCOUNTER — Other Ambulatory Visit: Payer: Self-pay | Admitting: Family Medicine

## 2012-09-26 ENCOUNTER — Encounter: Payer: Self-pay | Admitting: Family Medicine

## 2012-10-28 ENCOUNTER — Encounter: Payer: Self-pay | Admitting: Nephrology

## 2012-11-03 ENCOUNTER — Other Ambulatory Visit: Payer: Self-pay | Admitting: *Deleted

## 2012-11-03 MED ORDER — GLUCOSE BLOOD VI STRP: ORAL_STRIP | Status: DC

## 2012-11-10 ENCOUNTER — Other Ambulatory Visit: Payer: Self-pay | Admitting: Family Medicine

## 2012-11-14 ENCOUNTER — Encounter: Payer: Self-pay | Admitting: Family Medicine

## 2012-11-14 ENCOUNTER — Ambulatory Visit (INDEPENDENT_AMBULATORY_CARE_PROVIDER_SITE_OTHER): Payer: Medicare Other | Admitting: Family Medicine

## 2012-11-14 VITALS — BP 124/78 | HR 80 | Temp 98.2°F | Wt 186.5 lb

## 2012-11-14 DIAGNOSIS — R0989 Other specified symptoms and signs involving the circulatory and respiratory systems: Secondary | ICD-10-CM

## 2012-11-14 DIAGNOSIS — I10 Essential (primary) hypertension: Secondary | ICD-10-CM

## 2012-11-14 DIAGNOSIS — E1149 Type 2 diabetes mellitus with other diabetic neurological complication: Secondary | ICD-10-CM

## 2012-11-14 DIAGNOSIS — N183 Chronic kidney disease, stage 3 unspecified: Secondary | ICD-10-CM

## 2012-11-14 DIAGNOSIS — E1142 Type 2 diabetes mellitus with diabetic polyneuropathy: Secondary | ICD-10-CM

## 2012-11-14 LAB — BASIC METABOLIC PANEL
Calcium: 9 mg/dL (ref 8.4–10.5)
GFR: 40.81 mL/min — ABNORMAL LOW (ref >60.00)
Glucose, Bld: 115 mg/dL — ABNORMAL HIGH (ref 70–99)
Potassium: 4.4 mEq/L (ref 3.5–5.1)
Sodium: 138 mEq/L (ref 135–145)

## 2012-11-14 LAB — CBC WITH DIFFERENTIAL/PLATELET
Basophils Absolute: 0 10*3/uL (ref 0.0–0.1)
Eosinophils Relative: 3.2 % (ref 0.0–5.0)
HCT: 31.5 % — ABNORMAL LOW (ref 39.0–52.0)
Hemoglobin: 10.3 g/dL — ABNORMAL LOW (ref 13.0–17.0)
Lymphocytes Relative: 15.6 % (ref 12.0–46.0)
Monocytes Relative: 8.4 % (ref 3.0–12.0)
Neutro Abs: 4 10*3/uL (ref 1.4–7.7)
RBC: 3.31 Mil/uL — ABNORMAL LOW (ref 4.22–5.81)
RDW: 14.6 % (ref 11.5–14.6)
WBC: 5.5 10*3/uL (ref 4.5–10.5)

## 2012-11-14 LAB — MICROALBUMIN / CREATININE URINE RATIO
Creatinine,U: 28.9 mg/dL
Microalb Creat Ratio: 3.1 mg/g (ref 0.0–30.0)
Microalb, Ur: 0.9 mg/dL (ref 0.0–1.9)

## 2012-11-14 LAB — HEMOGLOBIN A1C: Hgb A1c MFr Bld: 6.6 % — ABNORMAL HIGH (ref 4.6–6.5)

## 2012-11-14 MED ORDER — GLIMEPIRIDE 1 MG PO TABS: 1.0000 mg | ORAL_TABLET | Freq: Every day | ORAL | Status: DC

## 2012-11-14 NOTE — Addendum Note (Signed)
Addended by: Eustaquio Boyden on: 11/14/2012 09:48 AM   Modules accepted: Orders

## 2012-11-14 NOTE — Assessment & Plan Note (Signed)
Check today.  Stable, followed by [facility].

## 2012-11-14 NOTE — Assessment & Plan Note (Addendum)
Chronic, stable. In donut hole.  Requests cheaper alternative to onglyza. Start SU. Recheck blood work today. Reviewed hypoglycemic plan.

## 2012-11-14 NOTE — Patient Instructions (Addendum)
Let's restart aspirin and fish oil as it's been several months since your skin cancer surgery. May stop onglyza when you run out.  2 weeks after stopping, start low dose glimepiride once daily in the morning - will need to monitor sugars closely while on this medicine as you may have low numbers and feel bad - if this happens, let me know right away.   Return in 3 months for diabetes follow up and medicare wellness visit. Good to see you today, call us with questions.

## 2012-11-14 NOTE — Addendum Note (Signed)
Addended by: Eustaquio Boyden on: 11/14/2012 10:22 PM   Modules accepted: Orders

## 2012-11-14 NOTE — Progress Notes (Signed)
  Subjective:    Patient ID: Charles Rodgers, male    DOB: 1924-12-12, 79 y.o.   MRN: [id number]  HPI CC: 6 mo f/u   Entering donut hole for 4 months, requests samples of onglyza or change to generic or cheaper alternative.  DM - sugars running elevated at night time (167).  This morning fasting 114.  Denies low sugars.  Denies hypoglycemic symptoms.  No h/o CHF.  Foot exam today. Lab Results  Component Value Date   HGBA1C 6.7* 05/10/2012    HTN - compliant with meds, tolerating well.  No HA, vision changes, CP/tightness, SOB.  Some leg swelling noted.  CKD - stable. Followed by Dr. Wynelle Link.  Thyroid nodules - monitoring with ultrasound Subclavian blockage - monitoring per [facility].  Past Medical History  Diagnosis Date  . Arthritis   . Diabetes mellitus type 2, controlled   . Glaucoma   . HTN (hypertension)   . HLD (hyperlipidemia)   . CKD (chronic kidney disease) stage 3, GFR 30-59 ml/min     [facility]  . Urine incontinence   . BPH (benign prostatic hypertrophy)     s/p biopsy for possible prostate cancer, followed yearly by [facility]  . Secondary hyperparathyroidism   . Hearing loss   . Melanoma 2014    nose and left hand  . Thyroid mass 2014    found on carotid US  . Subclavian artery stenosis, right 2014    found on carotid US, seen [facility], recommended recheck 1 yr at vascular lab with Dr. Gilda Crease     Review of Systems Per HPI    Objective:   Physical Exam  Nursing note and vitals reviewed. Constitutional: He appears well-developed and well-nourished. No distress.  HENT:  Head: Normocephalic and atraumatic.  Mouth/Throat: Oropharynx is clear and moist. No oropharyngeal exudate.  Eyes: Conjunctivae and EOM are normal. Pupils are equal, round, and reactive to light. No scleral icterus.  Neck: Normal range of motion. Neck supple. Carotid bruit is present (faint R).  Cardiovascular: Normal rate, regular rhythm, normal heart sounds and intact distal pulses.   No  murmur heard. Pulmonary/Chest: Effort normal and breath sounds normal. No respiratory distress. He has no wheezes. He has no rales.  Musculoskeletal: He exhibits no edema (tr-1+ pitting bilaterally).  Diabetic foot exam: Normal inspection No skin breakdown No calluses  Normal DP/PT pulses Normal sensation to light touch and slightly dcreased monofilament Nails normal  Lymphadenopathy:    He has no cervical adenopathy.  Skin: Skin is warm and dry. No rash noted.  Psychiatric: He has a normal mood and affect.       Assessment & Plan:

## 2012-11-14 NOTE — Assessment & Plan Note (Signed)
Found to have subclavian stenosis - followed by Dr. Lorretta Harp

## 2012-11-14 NOTE — Assessment & Plan Note (Signed)
Chronic, stable. Continue meds. 

## 2012-11-15 ENCOUNTER — Encounter: Payer: Self-pay | Admitting: *Deleted

## 2012-12-08 LAB — BASIC METABOLIC PANEL
BUN: 44 mg/dL — AB (ref 4–21)
Creat: 1.82
Potassium: 4.5 mmol/L

## 2012-12-08 LAB — CBC
HGB: 10.2 g/dL
WBC: 4.8

## 2012-12-13 ENCOUNTER — Telehealth: Payer: Self-pay

## 2012-12-13 NOTE — Telephone Encounter (Signed)
Pt left v/m requesting cb. Spoke with pt; started glimepiride 1 mg taking one half tab daily; pt just started Glimepiride this morning. FBS today 115.   BS at 1 pm was 82; pt then ate peanut butter crackers; pt rechecked BS at 2 pm and BS was 113. Pt has not eaten lunch. Pt said he has felt pretty good. No sweating or confusion or shaking. For 2 weeks pt had not taken any med for diabetes and FBS ranged from 105-115. Pt wants to know if needs to take any med for diabetes.Please advise.

## 2012-12-14 NOTE — Telephone Encounter (Signed)
Patient notified and will call with an update in 1 month. 

## 2012-12-14 NOTE — Telephone Encounter (Signed)
Lab Results  Component Value Date   HGBA1C 6.6* 11/14/2012    off onglyza 2/2 donut hole  Lab Results  Component Value Date   CREATININE 1.7* 11/14/2012  let's hold glimiperide for now - continue monitoring sugars and let me know if start creeping upwards.  Would expect sugars to slowly creep up as onglyza is long acting medication, but ok to monitor for now, and update me with sugars in 1 month.

## 2012-12-21 ENCOUNTER — Encounter: Payer: Self-pay | Admitting: *Deleted

## 2013-01-06 ENCOUNTER — Telehealth: Payer: Self-pay | Admitting: *Deleted

## 2013-01-06 NOTE — Telephone Encounter (Signed)
Pt calls with an update on blood sugars as previously advised. He says he had a virus last week and his blood sugars flucatiated during that time, but since has returned to normal. He says this am fasting bs was 128 and that it has ranged between 110-130 in am, and 140-170 in evening after meal.  He also states that he is not taking any DM meds and has just been watching his diet and that he feels good.  I advised we would call him back if we needed any additional info or had any instructions for him.

## 2013-01-08 NOTE — Telephone Encounter (Signed)
Noted. We will review numbers off meds when he returns next month for follow up - but update me in interim if sugars trending higher. Lab Results  Component Value Date   HGBA1C 6.6* 11/14/2012    Lab Results  Component Value Date   CREATININE 1.82 12/08/2012

## 2013-01-09 NOTE — Telephone Encounter (Signed)
Patient notified and will call if numbers start going higher.

## 2013-01-13 DIAGNOSIS — R972 Elevated prostate specific antigen [PSA]: Secondary | ICD-10-CM | POA: Insufficient documentation

## 2013-01-23 ENCOUNTER — Encounter: Payer: Self-pay | Admitting: Family Medicine

## 2013-02-14 ENCOUNTER — Encounter: Payer: Self-pay | Admitting: Family Medicine

## 2013-02-14 ENCOUNTER — Ambulatory Visit (INDEPENDENT_AMBULATORY_CARE_PROVIDER_SITE_OTHER): Payer: Medicare Other | Admitting: Family Medicine

## 2013-02-14 VITALS — BP 124/74 | HR 68 | Temp 97.8°F | Wt 185.5 lb

## 2013-02-14 DIAGNOSIS — E785 Hyperlipidemia, unspecified: Secondary | ICD-10-CM

## 2013-02-14 DIAGNOSIS — Z23 Encounter for immunization: Secondary | ICD-10-CM

## 2013-02-14 DIAGNOSIS — R9389 Abnormal findings on diagnostic imaging of other specified body structures: Secondary | ICD-10-CM

## 2013-02-14 DIAGNOSIS — E1149 Type 2 diabetes mellitus with other diabetic neurological complication: Secondary | ICD-10-CM

## 2013-02-14 DIAGNOSIS — R946 Abnormal results of thyroid function studies: Secondary | ICD-10-CM

## 2013-02-14 DIAGNOSIS — N183 Chronic kidney disease, stage 3 unspecified: Secondary | ICD-10-CM

## 2013-02-14 DIAGNOSIS — E1142 Type 2 diabetes mellitus with diabetic polyneuropathy: Secondary | ICD-10-CM

## 2013-02-14 DIAGNOSIS — N189 Chronic kidney disease, unspecified: Secondary | ICD-10-CM

## 2013-02-14 DIAGNOSIS — D631 Anemia in chronic kidney disease: Secondary | ICD-10-CM

## 2013-02-14 DIAGNOSIS — I10 Essential (primary) hypertension: Secondary | ICD-10-CM

## 2013-02-14 LAB — BASIC METABOLIC PANEL
CO2: 25 mEq/L (ref 19–32)
Calcium: 9.2 mg/dL (ref 8.4–10.5)
Creatinine, Ser: 1.7 mg/dL — ABNORMAL HIGH (ref 0.4–1.5)
GFR: 39.97 mL/min — ABNORMAL LOW (ref >60.00)
Potassium: 4.5 mEq/L (ref 3.5–5.1)
Sodium: 138 mEq/L (ref 135–145)

## 2013-02-14 LAB — HEMOGLOBIN A1C: Hgb A1c MFr Bld: 6.6 % — ABNORMAL HIGH (ref 4.6–6.5)

## 2013-02-14 NOTE — Progress Notes (Addendum)
Patient seen and examined with PA student Ricarda Frame.  Note reviewed, agree with assessment and plan unless changes documented in my note.  Subjective:    Patient ID: Charles Rodgers, male    DOB: 12/12/1924, 79 y.o.   MRN: [id number]  HPI CC: 82mo DM f/u  DM - fasting 105-132 in am, night time 130-185.  Check twice daily.  No hypoglycemic sxs.  Not taking any diabetes meds.  Stopped sulfonylurea because of drop in cbg to 85.  Has appt with eye doctor 03/03/2013.  No paresthesias.  Foot exam today.  Tries to follow diabetic diet no sweetened beverages.  HTN - compliant with meds.  On amlodipine, lasix, and lisinopril hctz daily HLD - on pravastatin and tolerating well. CKD stage 3 - followed by Dr. Wynelle Link  Thyroid abnormality - 2 small stable thyroid nodules as well as area of calcification difficult to characterize when [place] last done 05/2012.  Will order recheck today.  Would like flu shot.  Asks if we can follow prostate here - agreed to do this.  Past Medical History  Diagnosis Date  . Arthritis   . Diabetes mellitus type 2, controlled   . Glaucoma   . HTN (hypertension)   . HLD (hyperlipidemia)   . CKD (chronic kidney disease) stage 3, GFR 30-59 ml/min     [facility]  . Urine incontinence   . BPH (benign prostatic hypertrophy)     with elevated PSA, s/p biopsy for possible prostate cancer, followed yearly by [facility] (last seen 01/16/2013)  . Secondary hyperparathyroidism   . Hearing loss   . Melanoma 2014    nose and left hand  . Thyroid mass 2014    found on carotid US  . Subclavian artery stenosis, right 2014    found on carotid US, seen [facility]VS, recommended recheck 1 yr at vascular lab with Dr. Gilda Crease     Review of Systems Per HPI    Objective:   Physical Exam  Nursing note and vitals reviewed. Constitutional: He appears well-developed and well-nourished. No distress.  HENT:  Head: Normocephalic and atraumatic.  Right Ear: External ear normal.  Left Ear:  External ear normal.  Nose: Nose normal.  Mouth/Throat: Oropharynx is clear and moist. No oropharyngeal exudate.  Eyes: Conjunctivae and EOM are normal. Pupils are equal, round, and reactive to light. No scleral icterus.  Neck: Normal range of motion. Neck supple. Carotid bruit is not present. No thyromegaly present.  Cardiovascular: Normal rate, regular rhythm, normal heart sounds and intact distal pulses.   No murmur heard. Pulmonary/Chest: Effort normal and breath sounds normal. No respiratory distress. He has no wheezes. He has no rales.  Musculoskeletal: Edema: 1+ pedal edema BLE.  Diabetic foot exam: Normal inspection No skin breakdown No calluses  Normal DP/PT pulses Normal sensation to light touch and decreased to monofilament throughout Nails normal  Lymphadenopathy:    He has no cervical adenopathy.  Skin: Skin is warm and dry. No rash noted.  Psychiatric: He has a normal mood and affect.       Assessment & Plan:

## 2013-02-14 NOTE — Assessment & Plan Note (Signed)
Check Cr today - seems stable. Followed by Dr. Wynelle Link.

## 2013-02-14 NOTE — Patient Instructions (Addendum)
Flu shot today. Blood work today Pass by [facility] office to schedule thyroid ultrasound repeat. We can follow you here for prostate issue Good to see you - return to see me in 4-6 months for wellness exam, prior fasting for blood work

## 2013-02-14 NOTE — Progress Notes (Signed)
  Subjective:    Patient ID: Charles Rodgers, male    DOB: 1924-11-20, 79 y.o.   MRN: [id number]  HPI CC: DM2 f/u  Reports feeling well. Fasting sugars 105-132 in morning and 130-185 right before bed around 10:15 pm. Checks sugars twice daily before breakfast and before bed.  No symptoms of low sugar. Not taking any DM2  medications. First time he took SU it dropped sugar to 85 with no symptoms so he called office and instructed to stop medication. Complains of numbness in feat with no burning or tingling.   Will see eye doctor Dec 5.  Gets diabetic foot exams with kidney doctor (last appointment was a few months ago).  Requests flu shot.   Review of Systems  Constitutional: Negative for fever and chills.  HENT: Negative for congestion, rhinorrhea, sinus pressure, sneezing and sore throat.   Respiratory: Negative for cough, chest tightness and shortness of breath.   Cardiovascular: Positive for leg swelling. Negative for chest pain.  Gastrointestinal: Negative for nausea, vomiting, abdominal pain, diarrhea and constipation.  Neurological: Negative for dizziness and light-headedness.       Objective:   Physical Exam  Constitutional: He appears well-developed and well-nourished. No distress.  HENT:  Head: Normocephalic.  Right Ear: Tympanic membrane is scarred.  Left Ear: Tympanic membrane is scarred.  Eyes: Conjunctivae and EOM are normal. Pupils are equal, round, and reactive to light. Right eye exhibits no discharge. Left eye exhibits no discharge.  Neck: Normal range of motion.  Cardiovascular: Normal rate, regular rhythm and normal heart sounds.   Pulmonary/Chest: Effort normal and breath sounds normal. No respiratory distress. He has no wheezes.  Musculoskeletal: He exhibits edema (bilateral 1+ pedal edema).  Neurological: He is alert.  Skin: Skin is warm and dry. No rash noted. He is not diaphoretic. No erythema.  Diabetic foot exam: normal pulses bilaterally,  decreased sensation bilaterally      Assessment & Plan:  Schedule repeat thyroid ultrasound (previous area of calcification and two stable thyroid nodules in March 2014). Check A1C, blood sugar.  Flu shot today. At patients request, will follow prostate issues in this office and refer back to urology as necessary.  Schedule annual wellness exam in 4-6 months.

## 2013-02-14 NOTE — Assessment & Plan Note (Signed)
Chronic, stable on pravastatin.  Continue med.

## 2013-02-14 NOTE — Assessment & Plan Note (Signed)
Will continue to monitor.

## 2013-02-14 NOTE — Addendum Note (Signed)
Addended by: Josph Macho A on: 02/14/2013 09:44 AM   Modules accepted: Orders

## 2013-02-14 NOTE — Progress Notes (Signed)
Pre-visit discussion using our clinic review tool. No additional management support is needed unless otherwise documented below in the visit note.  

## 2013-02-14 NOTE — Assessment & Plan Note (Signed)
Recheck thyroid US today.

## 2013-02-14 NOTE — Assessment & Plan Note (Signed)
Chronic, seems stable off meds. Check A1c today.  Foot exam today.

## 2013-02-14 NOTE — Assessment & Plan Note (Signed)
Chronic, stable. continue meds. 

## 2013-02-15 ENCOUNTER — Encounter: Payer: Self-pay | Admitting: *Deleted

## 2013-02-20 ENCOUNTER — Ambulatory Visit: Payer: Self-pay | Admitting: Family Medicine

## 2013-02-21 ENCOUNTER — Other Ambulatory Visit: Payer: Self-pay | Admitting: Family Medicine

## 2013-02-21 ENCOUNTER — Encounter: Payer: Self-pay | Admitting: Family Medicine

## 2013-02-21 DIAGNOSIS — E041 Nontoxic single thyroid nodule: Secondary | ICD-10-CM

## 2013-02-22 ENCOUNTER — Telehealth: Payer: Self-pay

## 2013-02-22 NOTE — Telephone Encounter (Signed)
Pt wants to know if needs to keep lab appt on 01/24/13 and still have thyroid biopsy on 03/08/13. Per 02/20/13 imaging result note advised pt to keep lab appt on 01/24/13 prior to thyroid biopsy.pt voiced understanding.

## 2013-02-24 ENCOUNTER — Other Ambulatory Visit (INDEPENDENT_AMBULATORY_CARE_PROVIDER_SITE_OTHER): Payer: Medicare Other

## 2013-02-24 DIAGNOSIS — E041 Nontoxic single thyroid nodule: Secondary | ICD-10-CM

## 2013-02-24 LAB — TSH: TSH: 1.71 u[IU]/mL (ref 0.35–5.50)

## 2013-02-27 ENCOUNTER — Encounter: Payer: Self-pay | Admitting: *Deleted

## 2013-02-27 DIAGNOSIS — E041 Nontoxic single thyroid nodule: Secondary | ICD-10-CM

## 2013-02-27 HISTORY — DX: Nontoxic single thyroid nodule: E04.1

## 2013-02-27 HISTORY — PX: BIOPSY THYROID: PRO38

## 2013-03-08 ENCOUNTER — Other Ambulatory Visit (HOSPITAL_COMMUNITY)
Admission: RE | Admit: 2013-03-08 | Discharge: 2013-03-08 | Disposition: A | Discharge status: Home / Self Care | Payer: Medicare Other | Source: Ambulatory Visit | Attending: Diagnostic Radiology | Admitting: Diagnostic Radiology

## 2013-03-08 ENCOUNTER — Ambulatory Visit
Admission: RE | Admit: 2013-03-08 | Discharge: 2013-03-08 | Disposition: A | Discharge status: Home / Self Care | Payer: PRIVATE HEALTH INSURANCE | Source: Ambulatory Visit | Attending: Family Medicine | Admitting: Family Medicine

## 2013-03-08 DIAGNOSIS — E041 Nontoxic single thyroid nodule: Secondary | ICD-10-CM | POA: Insufficient documentation

## 2013-03-10 ENCOUNTER — Encounter: Payer: Self-pay | Admitting: Family Medicine

## 2013-03-21 ENCOUNTER — Other Ambulatory Visit: Payer: Self-pay | Admitting: Family Medicine

## 2013-04-24 ENCOUNTER — Encounter: Payer: Self-pay | Admitting: Podiatry

## 2013-04-24 ENCOUNTER — Ambulatory Visit (INDEPENDENT_AMBULATORY_CARE_PROVIDER_SITE_OTHER): Payer: Medicare Other | Admitting: Podiatry

## 2013-04-24 VITALS — BP 107/57 | HR 74 | Resp 16 | Ht 65.0 in | Wt 185.0 lb

## 2013-04-24 DIAGNOSIS — M79609 Pain in unspecified limb: Secondary | ICD-10-CM

## 2013-04-24 DIAGNOSIS — B351 Tinea unguium: Secondary | ICD-10-CM

## 2013-04-24 LAB — HM DIABETES FOOT EXAM

## 2013-04-24 NOTE — Progress Notes (Signed)
He presents today chief complaint of painful elongated toenails bilateral.  Objective: Vital signs are stable he is alert and oriented x3. Pulses are palpable bilateral. Nails are thick yellow dystrophic clinically mycotic and painful palpation.  Assessment: Pain in limb secondary to onychomycosis 1 through 5 bilateral.  Plan: Debridement of nails 1 through 5 bilateral is cover service secondary to pain. Followup with him in 3 months.

## 2013-05-12 ENCOUNTER — Other Ambulatory Visit: Payer: Self-pay | Admitting: Family Medicine

## 2013-06-09 LAB — PTH, INTACT: PTH Interp: 38

## 2013-06-09 LAB — CBC
HEMOGLOBIN: 10.4 g/dL
PLATELET COUNT: 110
WBC: 4

## 2013-06-09 LAB — COMPREHENSIVE METABOLIC PANEL
Creat: 1.83
Potassium: 4.2 mmol/L
Sodium: 139 mmol/L (ref 137–147)

## 2013-06-09 LAB — VITAMIN D 25 HYDROXY (VIT D DEFICIENCY, FRACTURES): Vit D, 25-Hydroxy: 28.4

## 2013-06-20 ENCOUNTER — Other Ambulatory Visit: Payer: Self-pay | Admitting: Family Medicine

## 2013-06-27 ENCOUNTER — Telehealth: Payer: Self-pay | Admitting: *Deleted

## 2013-06-27 NOTE — Telephone Encounter (Signed)
Patient has an appt on 07/06/13 with Dr. Fletcher Anon and he wants to know if he needs his abd aorta + abi before seeing Dr. Fletcher Anon.

## 2013-06-27 NOTE — Telephone Encounter (Signed)
Disregard this. Put under the wrong patient.   This patient wants to see his pcp before scheduling carotid doppler.

## 2013-07-17 ENCOUNTER — Encounter: Payer: Self-pay | Admitting: *Deleted

## 2013-07-18 ENCOUNTER — Ambulatory Visit: Payer: Medicare Other | Admitting: Family Medicine

## 2013-07-24 ENCOUNTER — Ambulatory Visit: Payer: Medicare Other | Admitting: Podiatry

## 2013-07-25 ENCOUNTER — Other Ambulatory Visit: Payer: Self-pay

## 2013-07-25 MED ORDER — DOXAZOSIN MESYLATE 8 MG PO TABS: ORAL_TABLET | ORAL | Status: DC

## 2013-07-25 NOTE — Telephone Encounter (Signed)
Pt request status of cardura refill; pt advised done and to keep appt 08/01/13. Pt voiced understanding.

## 2013-07-25 NOTE — Telephone Encounter (Signed)
[place] left v/m requesting refill doxazosin. Pt has appt scheduled 08/01/13.

## 2013-08-01 ENCOUNTER — Encounter: Payer: Self-pay | Admitting: Family Medicine

## 2013-08-01 ENCOUNTER — Ambulatory Visit (INDEPENDENT_AMBULATORY_CARE_PROVIDER_SITE_OTHER): Payer: Medicare Other | Admitting: Family Medicine

## 2013-08-01 VITALS — BP 118/80 | HR 72 | Temp 97.4°F | Wt 183.0 lb

## 2013-08-01 DIAGNOSIS — E041 Nontoxic single thyroid nodule: Secondary | ICD-10-CM

## 2013-08-01 DIAGNOSIS — E1142 Type 2 diabetes mellitus with diabetic polyneuropathy: Secondary | ICD-10-CM

## 2013-08-01 DIAGNOSIS — I10 Essential (primary) hypertension: Secondary | ICD-10-CM

## 2013-08-01 DIAGNOSIS — N400 Enlarged prostate without lower urinary tract symptoms: Secondary | ICD-10-CM

## 2013-08-01 DIAGNOSIS — N183 Chronic kidney disease, stage 3 unspecified: Secondary | ICD-10-CM

## 2013-08-01 DIAGNOSIS — E1149 Type 2 diabetes mellitus with other diabetic neurological complication: Secondary | ICD-10-CM

## 2013-08-01 NOTE — Patient Instructions (Addendum)
Good to see you today, call us with questions. Return in 5-6 months for medicare wellness visit. No changes to medicines today.

## 2013-08-01 NOTE — Assessment & Plan Note (Signed)
H/o elevated PSA in past with BPH s/p benign biopsy.  Prior followed by [facility] but requests we follow here.  Will need PSA next visit.

## 2013-08-01 NOTE — Assessment & Plan Note (Signed)
Chronic, stable. Continue meds. 

## 2013-08-01 NOTE — Assessment & Plan Note (Signed)
Followed by [facility].

## 2013-08-01 NOTE — Progress Notes (Signed)
 BP 118/80  Pulse 72  Temp(Src) 97.4 F (36.3 C) (Oral)  Wt 183 lb (83.008 kg)   CC: 6 mo f/u Subjective:    Patient ID: Charles Rodgers, male    DOB: 05/14/1924, 80 y.o.   MRN: [id number]  HPI: Charles Rodgers is a 80 y.o. male presenting on 08/01/2013 for Follow-up   Chronic hearing loss - pending appt with audiology on Friday Recently saw skin doctor - several cancers removed (R hand, L scalp and L earlobe).  Sees doctor in [place]  ckd stage 3-4 - sees Dr. Kolluru.  Recent thyroid nodule s/p biopsy - benign follicular nodule  DM - regularly does check sugars 112 this morning.  Compliant with antihyperglycemic regimen which includes: diet controlled.  Denies low sugars or hypoglycemic symptoms.  Denies current paresthesias. Last diabetic eye exam 09/2012.  Pneumovax: 09/2011.  Prevnar: not done yet (we are out of prevnar today).  Foot exam per podiatry.   Lab Results  Component Value Date   HGBA1C 6.6* 02/14/2013    HTN - Compliant with current antihypertensive regimen of amlodipine 10mg daily lasix and lisinopril hctz 20/25mg.  Does not check blood pressures at home.  No low blood pressure symptoms of dizziness/syncope.  Denies HA, vision changes, CP/tightness, SOB, leg swelling.   Relevant past medical, surgical, family and social history reviewed and updated as indicated.  Allergies and medications reviewed and updated. Current Outpatient Prescriptions on File Prior to Visit  Medication Sig  . amLODipine (NORVASC) 10 MG tablet TAKE 1 TABLET BY MOUTH ONCE A DAY  . aspirin (ASPIRIN EC) 81 MG EC tablet Take 81 mg by mouth daily. Swallow whole.  . Blood Glucose Monitoring Suppl (ONE TOUCH ULTRA 2) W/DEVICE KIT   . brimonidine (ALPHAGAN) 0.2 % ophthalmic solution Place 1 drop into both eyes 2 (two) times daily.  . Calcium Carbonate-Vitamin D (CALTRATE 600+D PO) Take by mouth daily.  . Casanthranol-Docusate Sodium 30-100 MG CAPS Take by mouth daily.  . doxazosin  (CARDURA) 8 MG tablet TAKE 1 TABLET BY MOUTH EVERY NIGHT AT BEDTIME  . fish oil-omega-3 fatty acids 1000 MG capsule Take 2 g by mouth daily.  . furosemide (LASIX) 20 MG tablet Take 20 mg by mouth daily.  . glucose blood (ONE TOUCH ULTRA TEST) test strip Use to check blood sugar 3 times a day or as directed.  . latanoprost (XALATAN) 0.005 % ophthalmic solution   . lisinopril-hydrochlorothiazide (PRINZIDE,ZESTORETIC) 20-25 MG per tablet Take 1 tablet by mouth daily.  . Multiple Vitamins-Minerals (PRESERVISION AREDS) CAPS Take 2 capsules by mouth daily.  . ONETOUCH DELICA LANCETS MISC Use as directed  . potassium chloride (K-DUR) 10 MEQ tablet Take 10 mEq by mouth daily. Take with lasix.  . pravastatin (PRAVACHOL) 40 MG tablet TAKE 1 TABLET BY MOUTH EVERY NIGHT AT BEDTIME  . timolol (BETIMOL) 0.5 % ophthalmic solution Place 1 drop into both eyes 2 (two) times daily.   No current facility-administered medications on file prior to visit.    Review of Systems Per HPI unless specifically indicated above    Objective:    BP 118/80  Pulse 72  Temp(Src) 97.4 F (36.3 C) (Oral)  Wt 183 lb (83.008 kg)  Physical Exam  Nursing note and vitals reviewed. Constitutional: He appears well-developed and well-nourished. No distress.  HENT:  Mouth/Throat: Oropharynx is clear and moist. No oropharyngeal exudate.  Neck: Normal range of motion. Neck supple. No thyromegaly present.  Cardiovascular: Normal rate, regular rhythm, normal heart   sounds and intact distal pulses.   No murmur heard. Pulmonary/Chest: Effort normal and breath sounds normal. No respiratory distress. He has no wheezes. He has no rales.  Musculoskeletal: He exhibits no edema.  Psychiatric: He has a normal mood and affect.   Results for orders placed in visit on 08/01/13  HM DIABETES FOOT EXAM      Result Value Ref Range   HM Diabetic Foot Exam done podiatry        Assessment & Plan:   Problem List Items Addressed This Visit    Well controlled type 2 diabetes mellitus with peripheral neuropathy - Primary     Overall stable.  Did not check A1c today.  Prior antihyperglycemic regimen caused hypoglycemia.  Recheck next visit. Foot exam done by podiatry.    HTN (hypertension)     Chronic, stable. Continue meds.    CKD (chronic kidney disease) stage 3, GFR 30-59 ml/min     Followed by [facility].    BPH (benign prostatic hypertrophy)     H/o elevated PSA in past with BPH s/p benign biopsy.  Prior followed by [facility] but requests we follow here.  Will need PSA next visit.    Right thyroid nodule       Follow up plan: Return in about 6 months (around 02/01/2014), or as needed, for medicare wellness visit.

## 2013-08-01 NOTE — Progress Notes (Signed)
Pre visit review using our clinic review tool, if applicable. No additional management support is needed unless otherwise documented below in the visit note. 

## 2013-08-01 NOTE — Assessment & Plan Note (Signed)
Overall stable.  Did not check A1c today.  Prior antihyperglycemic regimen caused hypoglycemia.  Recheck next visit. Foot exam done by podiatry.

## 2013-08-02 ENCOUNTER — Telehealth: Payer: Self-pay | Admitting: Family Medicine

## 2013-08-02 NOTE — Telephone Encounter (Signed)
Relevant patient education mailed to patient.  

## 2013-08-09 ENCOUNTER — Telehealth: Payer: Self-pay

## 2013-08-09 NOTE — Telephone Encounter (Signed)
Relevant patient education mailed to patient.  

## 2013-08-24 ENCOUNTER — Other Ambulatory Visit: Payer: Self-pay | Admitting: Family Medicine

## 2013-09-15 ENCOUNTER — Other Ambulatory Visit: Payer: Self-pay | Admitting: Family Medicine

## 2013-09-27 LAB — HM DIABETES EYE EXAM

## 2013-10-27 ENCOUNTER — Other Ambulatory Visit: Payer: Self-pay | Admitting: *Deleted

## 2013-10-27 MED ORDER — AMLODIPINE BESYLATE 10 MG PO TABS: ORAL_TABLET | ORAL | Status: DC

## 2013-12-15 LAB — CBC
HGB: 10.2 g/dL
PLATELET COUNT: 87
WBC: 5.2

## 2013-12-15 LAB — COMPREHENSIVE METABOLIC PANEL
Creat: 2.03
Potassium: 4.2 mmol/L

## 2013-12-15 LAB — PTH, INTACT: PTH INTERP: 47

## 2013-12-20 ENCOUNTER — Ambulatory Visit (INDEPENDENT_AMBULATORY_CARE_PROVIDER_SITE_OTHER): Payer: Medicare Other

## 2013-12-20 DIAGNOSIS — Z23 Encounter for immunization: Secondary | ICD-10-CM

## 2014-01-03 ENCOUNTER — Encounter: Payer: Self-pay | Admitting: *Deleted

## 2014-02-01 ENCOUNTER — Other Ambulatory Visit: Payer: Self-pay | Admitting: Family Medicine

## 2014-02-01 DIAGNOSIS — D631 Anemia in chronic kidney disease: Secondary | ICD-10-CM

## 2014-02-01 DIAGNOSIS — N183 Chronic kidney disease, stage 3 unspecified: Secondary | ICD-10-CM

## 2014-02-01 DIAGNOSIS — I10 Essential (primary) hypertension: Secondary | ICD-10-CM

## 2014-02-01 DIAGNOSIS — N189 Chronic kidney disease, unspecified: Secondary | ICD-10-CM

## 2014-02-01 DIAGNOSIS — E785 Hyperlipidemia, unspecified: Secondary | ICD-10-CM

## 2014-02-01 DIAGNOSIS — E1142 Type 2 diabetes mellitus with diabetic polyneuropathy: Secondary | ICD-10-CM

## 2014-02-02 ENCOUNTER — Other Ambulatory Visit (INDEPENDENT_AMBULATORY_CARE_PROVIDER_SITE_OTHER): Payer: PRIVATE HEALTH INSURANCE

## 2014-02-02 DIAGNOSIS — D631 Anemia in chronic kidney disease: Secondary | ICD-10-CM

## 2014-02-02 DIAGNOSIS — E1140 Type 2 diabetes mellitus with diabetic neuropathy, unspecified: Secondary | ICD-10-CM

## 2014-02-02 DIAGNOSIS — N183 Chronic kidney disease, stage 3 unspecified: Secondary | ICD-10-CM

## 2014-02-02 DIAGNOSIS — I10 Essential (primary) hypertension: Secondary | ICD-10-CM

## 2014-02-02 DIAGNOSIS — E1142 Type 2 diabetes mellitus with diabetic polyneuropathy: Secondary | ICD-10-CM

## 2014-02-02 DIAGNOSIS — N189 Chronic kidney disease, unspecified: Secondary | ICD-10-CM

## 2014-02-02 DIAGNOSIS — E785 Hyperlipidemia, unspecified: Secondary | ICD-10-CM

## 2014-02-02 LAB — CBC WITH DIFFERENTIAL/PLATELET
Basophils Absolute: 0 10*3/uL (ref 0.0–0.1)
Basophils Relative: 0.3 % (ref 0.0–3.0)
EOS PCT: 4 % (ref 0.0–5.0)
Eosinophils Absolute: 0.2 10*3/uL (ref 0.0–0.7)
HEMATOCRIT: 29.4 % — AB (ref 39.0–52.0)
Hemoglobin: 9.4 g/dL — ABNORMAL LOW (ref 13.0–17.0)
LYMPHS ABS: 1.1 10*3/uL (ref 0.7–4.0)
Lymphocytes Relative: 18.9 % (ref 12.0–46.0)
MCHC: 32 g/dL (ref 30.0–36.0)
MCV: 97.1 fl (ref 78.0–100.0)
MONO ABS: 0.5 10*3/uL (ref 0.1–1.0)
Monocytes Relative: 8.7 % (ref 3.0–12.0)
Neutro Abs: 3.9 10*3/uL (ref 1.4–7.7)
Neutrophils Relative %: 68.1 % (ref 43.0–77.0)
PLATELETS: 79 10*3/uL — AB (ref 150.0–400.0)
RBC: 3.02 Mil/uL — ABNORMAL LOW (ref 4.22–5.81)
RDW: 14.9 % (ref 11.5–15.5)
WBC: 5.8 10*3/uL (ref 4.0–10.5)

## 2014-02-02 LAB — COMPREHENSIVE METABOLIC PANEL
ALK PHOS: 61 U/L (ref 39–117)
ALT: 12 U/L (ref 0–53)
AST: 19 U/L (ref 0–37)
Albumin: 2.9 g/dL — ABNORMAL LOW (ref 3.5–5.2)
BUN: 42 mg/dL — ABNORMAL HIGH (ref 6–23)
CO2: 21 mEq/L (ref 19–32)
Calcium: 8.8 mg/dL (ref 8.4–10.5)
Chloride: 112 mEq/L (ref 96–112)
Creatinine, Ser: 1.9 mg/dL — ABNORMAL HIGH (ref 0.4–1.5)
GFR: 34.94 mL/min — ABNORMAL LOW (ref >60.00)
Glucose, Bld: 100 mg/dL — ABNORMAL HIGH (ref 70–99)
Potassium: 4.3 mEq/L (ref 3.5–5.1)
SODIUM: 140 meq/L (ref 135–145)
Total Bilirubin: 0.3 mg/dL (ref 0.2–1.2)
Total Protein: 6.7 g/dL (ref 6.0–8.3)

## 2014-02-02 LAB — HEMOGLOBIN A1C: Hgb A1c MFr Bld: 6.4 % (ref 4.6–6.5)

## 2014-02-02 LAB — LIPID PANEL
Cholesterol: 96 mg/dL (ref 0–200)
HDL: 25.9 mg/dL — ABNORMAL LOW (ref >39.00)
LDL CALC: 56 mg/dL (ref 0–99)
NONHDL: 70.1
Total CHOL/HDL Ratio: 4
Triglycerides: 72 mg/dL (ref 0.0–149.0)
VLDL: 14.4 mg/dL (ref 0.0–40.0)

## 2014-02-09 ENCOUNTER — Ambulatory Visit (INDEPENDENT_AMBULATORY_CARE_PROVIDER_SITE_OTHER): Payer: Medicare Other | Admitting: Family Medicine

## 2014-02-09 ENCOUNTER — Encounter: Payer: Self-pay | Admitting: Family Medicine

## 2014-02-09 VITALS — BP 132/74 | HR 64 | Temp 98.2°F | Ht 68.25 in

## 2014-02-09 DIAGNOSIS — N189 Chronic kidney disease, unspecified: Secondary | ICD-10-CM

## 2014-02-09 DIAGNOSIS — Z23 Encounter for immunization: Secondary | ICD-10-CM

## 2014-02-09 DIAGNOSIS — E785 Hyperlipidemia, unspecified: Secondary | ICD-10-CM

## 2014-02-09 DIAGNOSIS — N183 Chronic kidney disease, stage 3 unspecified: Secondary | ICD-10-CM

## 2014-02-09 DIAGNOSIS — N400 Enlarged prostate without lower urinary tract symptoms: Secondary | ICD-10-CM

## 2014-02-09 DIAGNOSIS — D631 Anemia in chronic kidney disease: Secondary | ICD-10-CM

## 2014-02-09 DIAGNOSIS — E1142 Type 2 diabetes mellitus with diabetic polyneuropathy: Secondary | ICD-10-CM

## 2014-02-09 DIAGNOSIS — I10 Essential (primary) hypertension: Secondary | ICD-10-CM

## 2014-02-09 DIAGNOSIS — D696 Thrombocytopenia, unspecified: Secondary | ICD-10-CM

## 2014-02-09 DIAGNOSIS — Z7189 Other specified counseling: Secondary | ICD-10-CM | POA: Insufficient documentation

## 2014-02-09 DIAGNOSIS — Z0000 Encounter for general adult medical examination without abnormal findings: Secondary | ICD-10-CM

## 2014-02-09 MED ORDER — PRAVASTATIN SODIUM 40 MG PO TABS: ORAL_TABLET | ORAL | Status: DC

## 2014-02-09 MED ORDER — AMLODIPINE BESYLATE 10 MG PO TABS: ORAL_TABLET | ORAL | Status: DC

## 2014-02-09 MED ORDER — LISINOPRIL-HYDROCHLOROTHIAZIDE 20-25 MG PO TABS: 1.0000 | ORAL_TABLET | Freq: Every day | ORAL | Status: DC

## 2014-02-09 MED ORDER — DOXAZOSIN MESYLATE 8 MG PO TABS: ORAL_TABLET | ORAL | Status: DC

## 2014-02-09 NOTE — Assessment & Plan Note (Signed)
Chronic, stable. Continue pravastatin. 

## 2014-02-09 NOTE — Patient Instructions (Addendum)
prevnar today. Advanced directive packet provided today. Good to see you today, call us with questions Return in 6 months for follow up visit

## 2014-02-09 NOTE — Assessment & Plan Note (Signed)

## 2014-02-09 NOTE — Progress Notes (Signed)
BP 132/74 mmHg  Pulse 64  Temp(Src) 98.2 F (36.8 C) (Oral)  Ht 5' 8.25" (1.734 m)   CC: medicare wellness visit  Subjective:    Patient ID: Charles Rodgers, male    DOB: 1925-03-08, 80 y.o.   MRN: [id number]  HPI: Charles Rodgers is a 80 y.o. male presenting on 02/09/2014 for Annual Exam   ckd stage 3-4 - sees Dr. Juleen China. Recent thyroid nodule s/p biopsy - benign follicular nodule  Hearing screen - deferred as uses hearing aides. Trouble understanding wife. Vision screen - with eye doctor 09/2013. Known glaucoma. Known R visual loss. Denies depression, anhedonia or falls.  Preventative: Colon cancer screening - aged out. Denies bm changes or blood in stool. Prostate cancer screening - discussed, will stop. Stopped seeing urologist (Dr Bernardo Heater). Prior followed for BPH. consider checking PSA next lab draw. flu shot done Td 12/2011 Pneumovax 09/2011, prevnar today Shingles shot - needs to check with insurance. Advanced directives - would like wife to make medical decisions. Declines prolonged life support. Wouldn't want to be kept alive if <50% chance would survive. Packet provided today.  Caffeine: rare  Lives with wife, 1 dog (grown children)  Occupation: retired, worked Tourist information centre manager at [employer]  Edu: 5th grade  Activity: works Haematologist  Diet: some water, seldom fruits/vegetables.  No results found for: PSA  Relevant past medical, surgical, family and social history reviewed and updated as indicated.  Allergies and medications reviewed and updated. Current Outpatient Prescriptions on File Prior to Visit  Medication Sig  . aspirin (ASPIRIN EC) 81 MG EC tablet Take 81 mg by mouth daily. Swallow whole.  . Blood Glucose Monitoring Suppl (ONE TOUCH ULTRA 2) W/DEVICE KIT   . brimonidine (ALPHAGAN) 0.2 % ophthalmic solution Place 1 drop into both eyes 2 (two) times daily.  . Calcium Carbonate-Vitamin D (CALTRATE 600+D PO) Take by mouth daily.  Sarajane Marek Sodium 30-100 MG CAPS Take by mouth daily.  . fish oil-omega-3 fatty acids 1000 MG capsule Take 2 g by mouth daily.  . furosemide (LASIX) 20 MG tablet Take 20 mg by mouth daily.  Marland Kitchen latanoprost (XALATAN) 0.005 % ophthalmic solution   . Multiple Vitamins-Minerals (PRESERVISION AREDS) CAPS Take 2 capsules by mouth daily.  . ONE TOUCH ULTRA TEST test strip USE TO CHECK BLOOD SUGAR 3 TIMES A DAY  . ONETOUCH DELICA LANCETS MISC Use as directed  . potassium chloride (K-DUR) 10 MEQ tablet Take 10 mEq by mouth daily. Take with lasix.  Marland Kitchen timolol (BETIMOL) 0.5 % ophthalmic solution Place 1 drop into both eyes 2 (two) times daily.   No current facility-administered medications on file prior to visit.   Past Medical History  Diagnosis Date  . Arthritis   . Diabetes mellitus type 2, controlled   . Glaucoma   . HTN (hypertension)   . HLD (hyperlipidemia)   . CKD (chronic kidney disease) stage 3, GFR 30-59 ml/min     [facility]  . Urine incontinence   . BPH (benign prostatic hypertrophy)     with elevated PSA, s/p biopsy for possible prostate cancer, followed yearly by [facility] (last seen 01/16/2013)  . Secondary hyperparathyroidism   . Hearing loss   . Melanoma 2014    nose and left hand  . Thyroid nodule 02/2013    right upper lobe - s/p biopsy - benign follicular nodule  . Subclavian artery stenosis, right 2014    found on carotid US, seen [facility], recommended recheck 1 yr at  vascular lab with Dr. Delana Meyer    Review of Systems  Constitutional: Negative for fever, chills, activity change, appetite change, fatigue and unexpected weight change.  HENT: Negative for hearing loss.   Eyes: Negative for visual disturbance.  Respiratory: Negative for cough, chest tightness, shortness of breath and wheezing.   Cardiovascular: Negative for chest pain, palpitations and leg swelling.  Gastrointestinal: Negative for nausea, vomiting, abdominal pain, diarrhea, constipation, blood in stool and  abdominal distention.  Genitourinary: Negative for hematuria and difficulty urinating.  Musculoskeletal: Negative for myalgias, arthralgias and neck pain.  Skin: Negative for rash.  Neurological: Negative for dizziness, seizures, syncope and headaches.  Hematological: Negative for adenopathy. Does not bruise/bleed easily.  Psychiatric/Behavioral: Negative for dysphoric mood. The patient is not nervous/anxious.    Per HPI unless specifically indicated above    Objective:    BP 132/74 mmHg  Pulse 64  Temp(Src) 98.2 F (36.8 C) (Oral)  Ht 5' 8.25" (1.734 m)  Physical Exam  Constitutional: He is oriented to person, place, and time. He appears well-developed and well-nourished. No distress.  HENT:  Head: Normocephalic and atraumatic.  Right Ear: Hearing, tympanic membrane, external ear and ear canal normal.  Left Ear: Hearing, tympanic membrane, external ear and ear canal normal.  Nose: Nose normal.  Mouth/Throat: Uvula is midline, oropharynx is clear and moist and mucous membranes are normal. No oropharyngeal exudate, posterior oropharyngeal edema or posterior oropharyngeal erythema.  Eyes: Conjunctivae and EOM are normal. Pupils are equal, round, and reactive to light. No scleral icterus.  Neck: Normal range of motion. Neck supple. Carotid bruit is not present. No thyromegaly present.  Cardiovascular: Normal rate, regular rhythm, normal heart sounds and intact distal pulses.   No murmur heard. Pulses:      Radial pulses are 2+ on the right side, and 2+ on the left side.  Pulmonary/Chest: Effort normal and breath sounds normal. No respiratory distress. He has no wheezes. He has no rales.  Abdominal: Soft. Bowel sounds are normal. He exhibits no distension and no mass. There is no tenderness. There is no rebound and no guarding.  Genitourinary:  Prostate - aged out  Musculoskeletal: Normal range of motion. He exhibits no edema.  Lymphadenopathy:    He has no cervical adenopathy.    Neurological: He is alert and oriented to person, place, and time.  CN grossly intact, station and gait intact Recall 3/3  Calculation 5/5 serial 3s  Skin: Skin is warm and dry. No rash noted.  Psychiatric: He has a normal mood and affect. His behavior is normal. Judgment and thought content normal.  Nursing note and vitals reviewed.  Results for orders placed or performed in visit on 02/09/14  HM DIABETES EYE EXAM  Result Value Ref Range   HM Diabetic Eye Exam No Retinopathy No Retinopathy      Assessment & Plan:   Problem List Items Addressed This Visit    Well controlled type 2 diabetes mellitus with peripheral neuropathy    Chronic, stable. Continue monitoring diet     Relevant Medications      lisinopril-hydrochlorothiazide (PRINZIDE,ZESTORETIC) 20-25 MG per tablet      pravastatin (PRAVACHOL) tablet   Thrombocytopenia    Stable. Continue to monitor.    Medicare annual wellness visit, subsequent - Primary    I have personally reviewed the Medicare Annual Wellness questionnaire and have noted 1. The patient's medical and social history 2. Their use of alcohol, tobacco or illicit drugs 3. Their current medications and supplements  4. The patient's functional ability including ADL's, fall risks, home safety risks and hearing or visual impairment. 5. Diet and physical activity 6. Evidence for depression or mood disorders The patients weight, height, BMI have been recorded in the chart.  Hearing and vision has been addressed. I have made referrals, counseling and provided education to the patient based review of the above and I have provided the pt with a written personalized care plan for preventive services. Provider list updated - see scanned questionairre.  Reviewed preventative protocols and updated unless pt declined.    HTN (hypertension)    Chronic, stable. Continue meds.    Relevant Medications      amLODIpine (NORVASC) tablet      doxazosin (CARDURA) tablet       lisinopril-hydrochlorothiazide (PRINZIDE,ZESTORETIC) 20-25 MG per tablet      pravastatin (PRAVACHOL) tablet   HLD (hyperlipidemia)    Chronic, stable. Continue pravastatin.    Relevant Medications      amLODIpine (NORVASC) tablet      doxazosin (CARDURA) tablet      lisinopril-hydrochlorothiazide (PRINZIDE,ZESTORETIC) 20-25 MG per tablet      pravastatin (PRAVACHOL) tablet   Health maintenance examination    Preventative protocols reviewed and updated unless pt declined. Discussed healthy diet and lifestyle.     CKD (chronic kidney disease) stage 3, GFR 30-59 ml/min    Continue f/u with [facility].    BPH (benign prostatic hypertrophy)    Stable on cardura.  Consider checking PSA next visit. Will request ROI signed for records from [facility] - I don't see we have them here. Pt also amenable to discontinuing screening.    Anemia in chronic kidney disease    Continue f/u with Dr Juleen China.    Advanced care planning/counseling discussion    Advanced directives - would like wife to make medical decisions. Declines prolonged life support. Wouldn't want to be kept alive if <50% chance would survive. Packet provided today.        Follow up plan: Return in about 6 months (around 08/10/2014), or as needed, for annual exam, prior fasting for blood work.

## 2014-02-09 NOTE — Assessment & Plan Note (Signed)
Stable on cardura.  Consider checking PSA next visit. Will request ROI signed for records from [facility] - I don't see we have them here. Pt also amenable to discontinuing screening.

## 2014-02-09 NOTE — Addendum Note (Signed)
Addended by: Royann Shivers A on: 02/09/2014 12:29 PM   Modules accepted: Orders

## 2014-02-09 NOTE — Assessment & Plan Note (Signed)
Stable.       - Continue to monitor

## 2014-02-09 NOTE — Assessment & Plan Note (Signed)
Continue f/u with Dr Juleen China.

## 2014-02-09 NOTE — Assessment & Plan Note (Signed)
Chronic, stable. Continue meds. 

## 2014-02-09 NOTE — Assessment & Plan Note (Signed)
Continue f/u with [facility].

## 2014-02-09 NOTE — Assessment & Plan Note (Signed)
Advanced directives - would like wife to make medical decisions. Declines prolonged life support. Wouldn't want to be kept alive if <50% chance would survive. Packet provided today.

## 2014-02-09 NOTE — Assessment & Plan Note (Signed)
Preventative protocols reviewed and updated unless pt declined. Discussed healthy diet and lifestyle.  

## 2014-02-09 NOTE — Assessment & Plan Note (Signed)
Chronic, stable. Continue monitoring diet

## 2014-02-09 NOTE — Progress Notes (Signed)
Pre visit review using our clinic review tool, if applicable. No additional management support is needed unless otherwise documented below in the visit note. 

## 2014-03-05 ENCOUNTER — Ambulatory Visit (INDEPENDENT_AMBULATORY_CARE_PROVIDER_SITE_OTHER): Payer: Medicare Other | Admitting: Family Medicine

## 2014-03-05 ENCOUNTER — Encounter: Payer: Self-pay | Admitting: Family Medicine

## 2014-03-05 VITALS — BP 122/64 | HR 80 | Temp 97.8°F | Wt 172.2 lb

## 2014-03-05 DIAGNOSIS — M79671 Pain in right foot: Secondary | ICD-10-CM | POA: Insufficient documentation

## 2014-03-05 MED ORDER — DICLOFENAC SODIUM 1 % TD GEL: 1.0000 "application " | Freq: Three times a day (TID) | TRANSDERMAL | Status: DC

## 2014-03-05 NOTE — Progress Notes (Signed)
BP 122/64 mmHg  Pulse 80  Temp(Src) 97.8 F (36.6 C) (Oral)  Wt 172 lb 4 oz (78.132 kg)   CC: my foot has started bothering me  Subjective:    Patient ID: Charles Rodgers, male    DOB: 11/07/1924, 80 y.o.   MRN: [id number]  HPI: Tyshan Enderle is a 80 y.o. male presenting on 03/05/2014 for Joint Swelling   R ankle pain and swelling started >1wk ago after mowing lawn on riding mower, filling bags of leaves, getting on and lawnmower.  Next morning noticed worsening pain/swelling. Points to pain of lateral ankle and entire midfoot. Denies inciting trauma/falls.  Has tried tylenol for this. No ice or heat.  No h/o gout.  No redness or warmth. No fever No chest pain, dyspnea. Lab Results  Component Value Date   CREATININE 1.9* 02/02/2014  known h/o CKD stage 3-4.  Relevant past medical, surgical, family and social history reviewed and updated as indicated. Interim medical history since our last visit reviewed. Allergies and medications reviewed and updated.  Current Outpatient Prescriptions on File Prior to Visit  Medication Sig  . amLODipine (NORVASC) 10 MG tablet TAKE 1 TABLET BY MOUTH ONCE A DAY  . aspirin (ASPIRIN EC) 81 MG EC tablet Take 81 mg by mouth daily. Swallow whole.  . Blood Glucose Monitoring Suppl (ONE TOUCH ULTRA 2) W/DEVICE KIT   . brimonidine (ALPHAGAN) 0.2 % ophthalmic solution Place 1 drop into both eyes 2 (two) times daily.  . Calcium Carbonate-Vitamin D (CALTRATE 600+D PO) Take by mouth daily.  Sarajane Marek Sodium 30-100 MG CAPS Take by mouth daily.  Marland Kitchen doxazosin (CARDURA) 8 MG tablet TAKE 1 TABLET BY MOUTH EVERY NIGHT AT BEDTIME  . fish oil-omega-3 fatty acids 1000 MG capsule Take 2 g by mouth daily.  . furosemide (LASIX) 20 MG tablet Take 20 mg by mouth daily.  Marland Kitchen latanoprost (XALATAN) 0.005 % ophthalmic solution   . lisinopril-hydrochlorothiazide (PRINZIDE,ZESTORETIC) 20-25 MG per tablet Take 1 tablet by mouth daily.  . Multiple  Vitamins-Minerals (PRESERVISION AREDS) CAPS Take 2 capsules by mouth daily.  . ONE TOUCH ULTRA TEST test strip USE TO CHECK BLOOD SUGAR 3 TIMES A DAY  . ONETOUCH DELICA LANCETS MISC Use as directed  . potassium chloride (K-DUR) 10 MEQ tablet Take 10 mEq by mouth daily. Take with lasix.  Marland Kitchen pravastatin (PRAVACHOL) 40 MG tablet TAKE 1 TABLET BY MOUTH EVERY NIGHT AT BEDTIME  . timolol (BETIMOL) 0.5 % ophthalmic solution Place 1 drop into both eyes 2 (two) times daily.   No current facility-administered medications on file prior to visit.   Past Medical History  Diagnosis Date  . Arthritis   . Diabetes mellitus type 2, controlled   . Glaucoma   . HTN (hypertension)   . HLD (hyperlipidemia)   . CKD (chronic kidney disease) stage 3, GFR 30-59 ml/min     [facility]  . Urine incontinence   . BPH (benign prostatic hypertrophy)     with elevated PSA, s/p biopsy for possible prostate cancer, followed yearly by [facility] (last seen 01/16/2013)  . Secondary hyperparathyroidism   . Hearing loss   . Melanoma 2014    nose and left hand  . Thyroid nodule 02/2013    right upper lobe - s/p biopsy - benign follicular nodule  . Subclavian artery stenosis, right 2014    found on carotid US, seen [facility], recommended recheck 1 yr at vascular lab with Dr. Delana Meyer    Review of Systems  Per HPI unless specifically indicated above     Objective:    BP 122/64 mmHg  Pulse 80  Temp(Src) 97.8 F (36.6 C) (Oral)  Wt 172 lb 4 oz (78.132 kg)  Wt Readings from Last 3 Encounters:  03/05/14 172 lb 4 oz (78.132 kg)  08/01/13 183 lb (83.008 kg)  04/24/13 185 lb (83.915 kg)    Physical Exam  Constitutional: He appears well-developed and well-nourished. No distress.  Musculoskeletal: He exhibits edema (1+ pedal edema from lower leg throughout foot).  Diminished pulses BLE No erythema or warmth of foot Tender to palpation R>L dorsal metatarsal shafts No pain or ligament laxity at ankle, no sole pain. Skin intact,  sensation intact  Nursing note and vitals reviewed.  Results for orders placed or performed in visit on 02/09/14  HM DIABETES EYE EXAM  Result Value Ref Range   HM Diabetic Eye Exam No Retinopathy No Retinopathy      Assessment & Plan:   Problem List Items Addressed This Visit    Foot pain, right - Primary    R>L, anticipate tendonitis/bursitis of foot after recent overexertion working yard Treat with ice/heating pad, tylenol scheduled, and voltaren gel. Update if not improved with treatment. Doubt stress fracture but if not better with treatment recommended return for xray.        Follow up plan: Return if symptoms worsen or fail to improve.

## 2014-03-05 NOTE — Patient Instructions (Signed)
I think you have tendonitis of your feet. Take tylenol 500mg  three times daily with food, may take extra at night time. May start using heating pad to feet and could soak in epsom salt if it's helpful. Use voltaren cream to right foot for inflammation, pea sized amount each time. If not better with this, let us know for xray of right foot.

## 2014-03-05 NOTE — Assessment & Plan Note (Signed)
R>L, anticipate tendonitis/bursitis of foot after recent overexertion working yard Treat with ice/heating pad, tylenol scheduled, and voltaren gel. Update if not improved with treatment. Doubt stress fracture but if not better with treatment recommended return for xray.

## 2014-03-05 NOTE — Progress Notes (Signed)
Pre visit review using our clinic review tool, if applicable. No additional management support is needed unless otherwise documented below in the visit note. 

## 2014-03-07 ENCOUNTER — Other Ambulatory Visit: Payer: Self-pay | Admitting: Family Medicine

## 2014-03-30 DIAGNOSIS — J449 Chronic obstructive pulmonary disease, unspecified: Secondary | ICD-10-CM

## 2014-03-30 HISTORY — DX: Chronic obstructive pulmonary disease, unspecified: J44.9

## 2014-04-01 ENCOUNTER — Encounter: Payer: Self-pay | Admitting: Family Medicine

## 2014-04-09 DIAGNOSIS — H4011X0 Primary open-angle glaucoma, stage unspecified: Secondary | ICD-10-CM | POA: Diagnosis not present

## 2014-04-10 ENCOUNTER — Other Ambulatory Visit: Payer: Self-pay | Admitting: Family Medicine

## 2014-04-17 DIAGNOSIS — H4011X0 Primary open-angle glaucoma, stage unspecified: Secondary | ICD-10-CM | POA: Diagnosis not present

## 2014-06-14 ENCOUNTER — Other Ambulatory Visit: Payer: Self-pay | Admitting: Family Medicine

## 2014-06-15 DIAGNOSIS — N183 Chronic kidney disease, stage 3 (moderate): Secondary | ICD-10-CM | POA: Diagnosis not present

## 2014-06-15 DIAGNOSIS — R609 Edema, unspecified: Secondary | ICD-10-CM | POA: Diagnosis not present

## 2014-06-15 DIAGNOSIS — I129 Hypertensive chronic kidney disease with stage 1 through stage 4 chronic kidney disease, or unspecified chronic kidney disease: Secondary | ICD-10-CM | POA: Diagnosis not present

## 2014-06-15 DIAGNOSIS — D631 Anemia in chronic kidney disease: Secondary | ICD-10-CM | POA: Diagnosis not present

## 2014-06-15 DIAGNOSIS — N2581 Secondary hyperparathyroidism of renal origin: Secondary | ICD-10-CM | POA: Diagnosis not present

## 2014-06-15 LAB — COMPREHENSIVE METABOLIC PANEL
BUN: 43 mg/dL — AB (ref 4–21)
Creat: 1.6
EGFR (Non-African Amer.): 38
Glucose: 105
Potassium: 4.1 mmol/L
Sodium: 140 mmol/L (ref 137–147)

## 2014-06-15 LAB — PTH, INTACT: PTH Interp: 54

## 2014-06-15 LAB — CBC
HGB: 3 g/dL
WBC: 4.4
platelet count: 94

## 2014-06-27 ENCOUNTER — Encounter: Payer: Self-pay | Admitting: *Deleted

## 2014-07-22 NOTE — Discharge Summary (Signed)
PATIENT NAME:  Charles Rodgers, Charles Rodgers MR#:  [id number] DATE OF BIRTH:  1924/09/27  DATE OF ADMISSION:  06/24/2011 DATE OF DISCHARGE:  06/26/2011  ADMITTING DIAGNOSES: Nausea, vomiting, dehydration, acute renal failure.   DISCHARGE DIAGNOSES: 1. Acute gastroenteritis.  2. Dehydration. 3. Fall due to dehydration as well as acute on chronic renal failure.  4. Acute on chronic renal failure. 5. Hypoglycemia in diabetic.  6. Hypokalemia, supplemented.  7. History of diabetes mellitus. 8. Hypertension.  9. Hyperlipidemia.  10. Benign prostatic hypertrophy. 11. Glaucoma.   DISCHARGE CONDITION: Stable.   DISCHARGE MEDICATIONS: Patient is to resume his outpatient medications which are:  1. Pravachol 40 mg p.o. at bedtime.  2. Cardura 8 mg p.o. at bedtime.   ADDITIONAL MEDICATION:  1. Glimepiride 2 mg p.o. daily, this is new dose, only half of prior dose. 2. Norvasc 5 mg p.o. daily.   NOTE: Patient is not to take lisinopril, HCTZ unless recommended by PCP or nephrology.   DIET: He is to continue 1800 ADA diet, low fat, soft, easy to digest, low sodium.  ACTIVITY LIMITATIONS: As tolerated.   FOLLOW UP: Follow-up appointment with Dr. Juleen China in two days after discharge as well as Dr. Brunetta Genera in two days after discharge.    CONSULTANTS:  1. Nephrology, Dr. Juleen China. 2. Care management.   HISTORY OF PRESENT ILLNESS: Patient is an 81 year old Caucasian male with past medical history significant for history of diabetes, hypertension, hyperlipidemia who presented on 06/24/2011 with complaints of falls four times on day of admission. Please refer to Dr. Marshia Ly admission note on 06/24/2011. Apparently patient was having nausea, vomiting as well as diarrhea every 15 minutes for the past few days and was not able to keep anything down. He stood up and he fell down a few times but there was no loss of consciousness reported. On arrival to the Emergency Room patient was noted to be severely  hypoglycemia with fingerstick of 30 and creatinine was elevated to 2 as discussed hospitalist services were contacted for admission. On arrival to the Emergency Room patient's temperature was 98, pulse 70, respiratory rate 17, blood pressure 147/80, pulse oximetry was 100% on oxygen therapy. Physical examination was unremarkable.   LABORATORY, DIAGNOSTIC AND RADIOLOGICAL DATA: Ultrasound of kidneys bilateral 06/25/2011 showing kidneys bilaterally hyperechogenic with cortical thinning. These findings are consistent with a history of renal insufficiency. No hydronephrosis or other acute change is identified. Incidental note is made of left renal cyst. Prostate gland is prominent and bulges into the base of urinary bladder. Bilateral ureteral flow jets are seen.   Patient's EKG showed PVCs, normal axis, first degree AV block, otherwise unremarkable EKG. Patient's lab data taken in the Emergency Room on 06/24/2011: Blood glucose 30. On BMP patient's blood glucose level was 31, BUN and creatinine were 50 and 2.06, potassium 3.2, bicarbonate 18. Patient's liver enzymes showed albumin level of 3.1, otherwise liver enzymes were normal. White blood cell count was normal at 10.3, hemoglobin 10.6, platelet count 78. Urinalysis showed 3+ blood but negative for protein, nitrites, or leukocyte esterase, 64 red blood cells, 2 white blood cells, trace bacteria was seen.  HOSPITAL COURSE: Patient was admitted to the hospital. He was started on IV fluids and was rehydrated. He was also kept off glimepiride and was continued initially on D5 and then later required D10 because of persistent hypoglycemia.  1. In regards to acute gastroenteritis, patient was managed symptomatically clinically with IV fluids, antiemetics and his nausea, vomiting subsided very quickly. Next day,  06/25/2011, he was able to already get clear liquid diet and his diet was advanced by the day of discharge to soft diet which he was able to tolerate well.  He had no recurrence of his diarrheal stool and did quite well. It was felt that patient's acute gastroenteritis very likely was viral and no antibiotic therapy was prescribed.  2. In regards to dehydration, as mentioned above patient was rehydrated with high rate IV fluids and his dehydration resolved.  3. In regards to falls, it was felt that patient's falls could have been related to dehydration as well as hypoglycemia.  4. In regards to acute renal failure, initially it was felt to be acute renal failure as patient did not know that he had some underlying renal insufficiency, however, as patient's kidney function did not improve significantly over a period of time with residual creatinine of 1.74 on 06/26/2011 despite IV fluid administration and good urine output, also because of somewhat abnormal ultrasound of his kidneys signifying chronic renal disease, it was felt that patient very likely had acute on chronic renal failure. Nephrology consultation with Dr. Juleen China was obtained. Dr. Juleen China saw patient in consultation on 06/26/2011. He felt that patient's acute renal failure likely was due to prerenal azotemia most likely with a competent of ATN, however, Dr. Juleen China felt that patient may have also underlying chronic kidney disease as well. Ultrasound was reviewed by Dr. Juleen China and he felt that patient would benefit from  UPEP, SPEP as well as possibly repeated urinalysis due to hematuria and recommended follow up with him as outpatient in the clinic to evaluate his creatinine, serum electrolytes as well as examination of his volume status.  5. In regards to hematuria, he felt that it could have been traumatic from Foley catheterization. He recommended repeat urinalysis as well as microscopy as outpatient and consider ANCA, ANA as well as serum complements.  6. In regards to hypertension, Dr. Juleen China felt that patient is to continue medications such as amlodipine, however, patient's lisinopril as well  as HCTZ were placed on hold time being to ensure that patient's kidney function improves as good as possibly can.  7. In regards to anemia, he felt that it could be anemia of chronic disease. Recommended to check iron panel as outpatient.  8. He felt that patient probably would benefit from secondary hyperparathyroidism evaluation such as checking intact phosphorus with PTH as well as calcium and 25 vitamin D levels as outpatient. Recommended to follow up with him on 04/02 or 04/03 in the clinic for further evaluation. This was discussed with patient, family and nursing and patient was agreeable to proceed.  9. In regards to hypertension, patient's blood pressure medications, lisinopril as well as HCTZ were replaced with Norvasc. It is recommended to follow patient's blood pressure readings and make decisions about advancement of Norvasc or reinitiating his lisinopril, HCTZ depending on his needs. On day of discharge, however, patient's blood pressure remains quite high. Vital signs are temperature 98, pulse 66, respiration rate 20, blood pressure 166/98, oxygen saturation was 97% on room air at rest. It is recommended to advance patient's blood pressure medications depending on his needs as mentioned above.  10. In regards to hypoglycemia, patient's hypoglycemia was felt to be due to his diabetic medication, glimepiride, and acute renal failure. Patient's diabetic medication dose was decreased. Patient was advised to follow up with his primary care physician for further recommendations in regards to management of his diabetes. His blood glucose levels are good  on day of discharge. 11. For benign prostatic hypertrophy, patient is to continue Cardura. 12. For hyperlipidemia, patient is to continue Pravachol.  13. Patient had post voiding urine volume checked and was only 75 mL. It was felt that patient does not retain much urine and certainly did not look like urinary retention was underlying cause of his  acute on chronic renal failure. 14. Patient is being discharged from the hospital with above-mentioned medications and follow up.   TIME SPENT: 40 minutes.   ____________________________ Theodoro Grist, MD rv:cms D: 06/26/2011 18:45:24 ET T: 06/29/2011 09:37:24 ET JOB#: [id number]  cc: Theodoro Grist, MD, <Dictator> Mamie Levers, MD [place] MD ELECTRONICALLY SIGNED 07/01/2011 14:25

## 2014-07-22 NOTE — H&P (Signed)
PATIENT NAME:  Charles Rodgers, Charles Rodgers MR#:  [id number] DATE OF BIRTH:  Aug 12, 1924  DATE OF ADMISSION:  06/24/2011  PRIMARY CARE PHYSICIAN: Lorelee Market, MD   CHIEF COMPLAINT: Fall four times today.   HISTORY OF PRESENT ILLNESS: The patient is an 81 year old man who on Sunday and Monday was having nausea, vomiting, and diarrhea every 15 minutes, unable to keep anything down. On Tuesday it eased up a little bit, and on Wednesday he actually felt okay in the morning but then this evening had two more episodes of diarrhea. Then he stood up and fell down and could not get up. He fell four times today, no loss of consciousness. Today was the first time he ate something since Saturday. His wife also felt sick. No recent antibiotics. No recent travel out of the country. No abdominal pain. In the Emergency Room, he was found to be hypoglycemic with a fingerstick in the 30s. Creatinine was up at 2, in acute renal failure and also hypokalemic. Hospitalist Services were contacted for further evaluation.   PAST MEDICAL HISTORY:  1. Diabetes.  2. Hypertension.  3. Hyperlipidemia.  4. Benign prostatic hypertrophy. 5. Glaucoma.   PAST SURGICAL HISTORY:  1. Appendectomy.  2. He also had an accident where his arm and chest may have had an operation.   ALLERGIES: No known drug allergies.   MEDICATIONS:  1. Pravastatin 40 mg at bedtime.  2. Doxazosin 8 mg daily.  3. Lisinopril/HCT 20/12.5 mg, 1 tablet daily.  4. Glimepiride 4 mg daily.  5. He also takes three eyedrops. He is able to tell me the name of two of them, Timoptic 1 drop each eye b.i.d.  and Xalatan 1 drop each eye at bedtime. He does not know the name of the third eyedrop.   SOCIAL HISTORY: No smoking. No alcohol. No drug use. He lives at home. 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   FAMILY HISTORY: Mother died of cancer of the stomach. Father unknown.   REVIEW OF SYSTEMS: CONSTITUTIONAL: No fever, chills, or sweats. No weight  loss. No weight gain. Positive for weakness. EYES: No eye pain or eye strain. EARS, NOSE, MOUTH, AND THROAT: He does wear a hearing aid. Positive for a runny nose. No sore throat. No difficulty swallowing. CARDIOVASCULAR: No chest pain. No palpitations. RESPIRATORY: No shortness of breath. No coughing. No sputum. No hemoptysis. GASTROINTESTINAL: Positive for nausea. Positive for vomiting. No hematemesis. Positive for diarrhea. No bright red blood per rectum. No melena. GENITOURINARY: No burning on urination or hematuria. MUSCULOSKELETAL: No joint pain or muscle pain. INTEGUMENT: No rashes or eruptions. NEUROLOGIC: Today he ended up four times on the floor when standing. INTEGUMENT: No rashes or eruptions. PSYCHIATRIC: No anxiety or depression. ENDOCRINE: No thyroid problems. HEMATOLOGIC/LYMPHATIC: No known  anemia.   PHYSICAL EXAMINATION:  VITAL SIGNS: Temperature 98, pulse 70, respirations 17, blood pressure 147/80, pulse oximetry 100% on oxygen.   GENERAL: No respiratory distress. The patient is talking to me.   HEENT: Eyes: Conjunctivae and lids normal. Pupils are equal, round, and reactive to light. Extraocular muscles are intact. No nystagmus. Ears, nose, mouth, and throat: Tympanic membranes no erythema. Nasal mucosa no erythema. Throat no erythema. No exudate seen. Lips and gums no lesions.   NECK: No JVD. No bruits. No lymphadenopathy. No thyromegaly. No thyroid nodules palpated.   LUNGS: Lungs are clear to auscultation. No use of accessory muscles to breathe. No rhonchi, rales, or wheeze heard.   HEART: S1,  S2 normal. No gallops, rubs, or murmurs heard. Carotid upstroke 2+ bilaterally. No bruits.   EXTREMITIES: Dorsalis pedis pulses 2+ bilaterally. No edema to lower extremity.   ABDOMEN: Soft, nontender. No organomegaly/splenomegaly. Normoactive bowel sounds. No masses felt.   LYMPHATIC: No lymph nodes in the neck.   MUSCULOSKELETAL: No clubbing. No cyanosis on oxygen.   SKIN: No  ulcers or lesions.   NEUROLOGICAL: Cranial nerves II through XII are grossly intact. Deep tendon reflexes are 2+ bilateral lower extremities.   PSYCHIATRIC: The patient is oriented to person, place, and time.   LABORATORY, DIAGNOSTIC AND RADIOLOGICAL DATA:  EKG  by EMS showed PVCs, normal axis, first-degree AV block.  Initial fingerstick was 30. White blood cell count 10.3, hemoglobin and hematocrit 10.6 and 32.1, platelet count 78. Chemistry: Glucose 31, BUN 50, creatinine 2.06, sodium 144, potassium 3.2, chloride 114, and CO2 18, calcium 7.7.  Liver function tests are normal range. Albumin low at 3.1.  Repeat fingerstick is 43.   ASSESSMENT AND PLAN:  1. Acute renal failure and dehydration: Most likely secondary to nausea, vomiting, and diarrhea. We will give IV fluid hydration with D5 normal saline with potassium and continue to monitor on a daily basis. We will check a BMP in the a.m. We will get a renal ultrasound, hold lisinopril/HCT. We will also check orthostatic vital signs.  2. Persistent hypoglycemia with history of diabetes: The patient is on sulfonylurea, glimepiride.  We will hold that at this point. We will put on D5 normal saline and get fingersticks every hour. This can be prolonged secondary to the sulfonylurea. We will continue to monitor closely.  3. Nausea, vomiting, diarrhea, most likely gastroenteritis: We will send off stool studies. We will hold off on antibiotics at this point.  4. Hypokalemia: We will put potassium in IV fluids. Most likely this is secondary to the nausea, vomiting, diarrhea, and poor appetite.  5. Hypertension: We will continue to monitor. We will check orthostatic vital signs. Hold lisinopril/HCT at this point.  6. Benign prostatic hypertrophy: Continue doxazosin.  7. Glaucoma: Continue Timoptic and Xalatan.  8. Thrombocytopenia: Unclear cause. I do not have a prior CBC. We will hold off on heparin products at this time. We will do TEDs and SCDs for  deep vein thrombosis prophylaxis.  9. Anemia: I will guaiac stools and send off iron studies in the a.m.   CODE STATUS:  The patient is a FULL CODE.  TIME SPENT ON ADMISSION:  55 minutes.   ____________________________ Tana Conch. Leslye Peer, MD [id number] D: 06/24/2011 22:09:52 ET T: 06/25/2011 08:02:07 ET JOB#: [id number]  cc: Tana Conch. Leslye Peer, MD, <Dictator> Meindert A. Brunetta Genera, MD Marisue Brooklyn MD ELECTRONICALLY SIGNED 06/27/2011 7:22

## 2014-08-08 ENCOUNTER — Other Ambulatory Visit: Payer: Self-pay | Admitting: Family Medicine

## 2014-08-08 DIAGNOSIS — N183 Chronic kidney disease, stage 3 unspecified: Secondary | ICD-10-CM

## 2014-08-08 DIAGNOSIS — E1142 Type 2 diabetes mellitus with diabetic polyneuropathy: Secondary | ICD-10-CM

## 2014-08-09 ENCOUNTER — Other Ambulatory Visit (INDEPENDENT_AMBULATORY_CARE_PROVIDER_SITE_OTHER): Payer: Medicare Other

## 2014-08-09 DIAGNOSIS — E1142 Type 2 diabetes mellitus with diabetic polyneuropathy: Secondary | ICD-10-CM

## 2014-08-09 DIAGNOSIS — E1140 Type 2 diabetes mellitus with diabetic neuropathy, unspecified: Secondary | ICD-10-CM | POA: Diagnosis not present

## 2014-08-09 DIAGNOSIS — N183 Chronic kidney disease, stage 3 unspecified: Secondary | ICD-10-CM

## 2014-08-09 LAB — CBC WITH DIFFERENTIAL/PLATELET
Basophils Absolute: 0 10*3/uL (ref 0.0–0.1)
Basophils Relative: 0.2 % (ref 0.0–3.0)
EOS ABS: 0.1 10*3/uL (ref 0.0–0.7)
Eosinophils Relative: 2 % (ref 0.0–5.0)
HEMATOCRIT: 29.5 % — AB (ref 39.0–52.0)
HEMOGLOBIN: 9.8 g/dL — AB (ref 13.0–17.0)
LYMPHS ABS: 1 10*3/uL (ref 0.7–4.0)
LYMPHS PCT: 19.1 % (ref 12.0–46.0)
MCHC: 33.1 g/dL (ref 30.0–36.0)
MCV: 92.8 fl (ref 78.0–100.0)
Monocytes Absolute: 0.5 10*3/uL (ref 0.1–1.0)
Monocytes Relative: 8.7 % (ref 3.0–12.0)
NEUTROS ABS: 3.7 10*3/uL (ref 1.4–7.7)
Neutrophils Relative %: 70 % (ref 43.0–77.0)
PLATELETS: 76 10*3/uL — AB (ref 150.0–400.0)
RBC: 3.18 Mil/uL — ABNORMAL LOW (ref 4.22–5.81)
RDW: 15.5 % (ref 11.5–15.5)
WBC: 5.3 10*3/uL (ref 4.0–10.5)

## 2014-08-09 LAB — HEMOGLOBIN A1C: HEMOGLOBIN A1C: 6.3 % (ref 4.6–6.5)

## 2014-08-14 ENCOUNTER — Encounter: Payer: Self-pay | Admitting: Family Medicine

## 2014-08-14 ENCOUNTER — Encounter: Payer: Self-pay | Admitting: *Deleted

## 2014-08-14 ENCOUNTER — Ambulatory Visit (INDEPENDENT_AMBULATORY_CARE_PROVIDER_SITE_OTHER)
Admission: RE | Admit: 2014-08-14 | Discharge: 2014-08-14 | Disposition: A | Discharge status: Home / Self Care | Payer: Medicare Other | Source: Ambulatory Visit | Attending: Family Medicine | Admitting: Family Medicine

## 2014-08-14 ENCOUNTER — Ambulatory Visit (INDEPENDENT_AMBULATORY_CARE_PROVIDER_SITE_OTHER): Payer: Medicare Other | Admitting: Family Medicine

## 2014-08-14 VITALS — BP 106/58 | HR 70 | Temp 97.6°F | Wt 165.8 lb

## 2014-08-14 DIAGNOSIS — R634 Abnormal weight loss: Secondary | ICD-10-CM | POA: Insufficient documentation

## 2014-08-14 DIAGNOSIS — D631 Anemia in chronic kidney disease: Secondary | ICD-10-CM | POA: Diagnosis not present

## 2014-08-14 DIAGNOSIS — N183 Chronic kidney disease, stage 3 unspecified: Secondary | ICD-10-CM

## 2014-08-14 DIAGNOSIS — E1140 Type 2 diabetes mellitus with diabetic neuropathy, unspecified: Secondary | ICD-10-CM

## 2014-08-14 DIAGNOSIS — W57XXXA Bitten or stung by nonvenomous insect and other nonvenomous arthropods, initial encounter: Secondary | ICD-10-CM

## 2014-08-14 DIAGNOSIS — E1142 Type 2 diabetes mellitus with diabetic polyneuropathy: Secondary | ICD-10-CM

## 2014-08-14 DIAGNOSIS — J449 Chronic obstructive pulmonary disease, unspecified: Secondary | ICD-10-CM | POA: Diagnosis not present

## 2014-08-14 DIAGNOSIS — I10 Essential (primary) hypertension: Secondary | ICD-10-CM

## 2014-08-14 DIAGNOSIS — T148 Other injury of unspecified body region: Secondary | ICD-10-CM

## 2014-08-14 DIAGNOSIS — N189 Chronic kidney disease, unspecified: Secondary | ICD-10-CM | POA: Diagnosis not present

## 2014-08-14 LAB — COMPREHENSIVE METABOLIC PANEL
ALT: 19 U/L (ref 0–53)
AST: 23 U/L (ref 0–37)
Albumin: 4 g/dL (ref 3.5–5.2)
Alkaline Phosphatase: 68 U/L (ref 39–117)
BUN: 45 mg/dL — ABNORMAL HIGH (ref 6–23)
CO2: 24 mEq/L (ref 19–32)
CREATININE: 1.86 mg/dL — AB (ref 0.40–1.50)
Calcium: 9.3 mg/dL (ref 8.4–10.5)
Chloride: 107 mEq/L (ref 96–112)
GFR: 36.64 mL/min — ABNORMAL LOW (ref >60.00)
Glucose, Bld: 121 mg/dL — ABNORMAL HIGH (ref 70–99)
Potassium: 4.3 mEq/L (ref 3.5–5.1)
Sodium: 137 mEq/L (ref 135–145)
Total Bilirubin: 0.4 mg/dL (ref 0.2–1.2)
Total Protein: 7 g/dL (ref 6.0–8.3)

## 2014-08-14 LAB — POCT URINALYSIS DIPSTICK
Bilirubin, UA: NEGATIVE
Blood, UA: NEGATIVE
Glucose, UA: NEGATIVE
Ketones, UA: NEGATIVE
Leukocytes, UA: NEGATIVE
NITRITE UA: NEGATIVE
Protein, UA: NEGATIVE
Spec Grav, UA: >=1.03
UROBILINOGEN UA: 0.2
pH, UA: 6

## 2014-08-14 LAB — IBC PANEL
Iron: 66 ug/dL (ref 42–165)
Saturation Ratios: 21.6 % (ref 20.0–50.0)
Transferrin: 218 mg/dL (ref 212.0–360.0)

## 2014-08-14 LAB — FERRITIN: FERRITIN: 41 ng/mL (ref 22.0–322.0)

## 2014-08-14 MED ORDER — AMLODIPINE BESYLATE 5 MG PO TABS: 5.0000 mg | ORAL_TABLET | Freq: Every day | ORAL | Status: DC

## 2014-08-14 MED ORDER — DOXYCYCLINE HYCLATE 100 MG PO CAPS: 100.0000 mg | ORAL_CAPSULE | Freq: Two times a day (BID) | ORAL | Status: DC

## 2014-08-14 NOTE — Assessment & Plan Note (Signed)
bp low today - likely related to weight loss. Will decrease amlodipine to 5mg  daily - new dose sent to pharmacy.

## 2014-08-14 NOTE — Assessment & Plan Note (Signed)
Hgb 9.8 recently - thought related to CKD. Will check iron levels today.

## 2014-08-14 NOTE — Assessment & Plan Note (Addendum)
Chronic, stable. Now prediabetes range. Will be due for eye exam soon.

## 2014-08-14 NOTE — Assessment & Plan Note (Signed)
Tick removed - jaw remained, finally fully removed with 25g needle and forceps.  Given unclear duration, recommended and prescribed 7d doxy course.

## 2014-08-14 NOTE — Assessment & Plan Note (Signed)
18 lb unexpected weight loss noted over the past year. Pt endorses decreased appetite but no malaise or discomfort. In anemia, CKD and chronic thrombocytopenia - check SPEP and periph smear.  Check UA and CXR today as well.

## 2014-08-14 NOTE — Patient Instructions (Addendum)
Decrease amlodipine to 5mg  daily (may cut in half until you run out). New dose is at your pharmacy.  Tick removed - treat with doxycycline 7 day course. labwork today and xray and urine checked today for weight loss. Return in 2-3 months for follow up. Return to have left arm spot removed.

## 2014-08-14 NOTE — Progress Notes (Signed)
Pre visit review using our clinic review tool, if applicable. No additional management support is needed unless otherwise documented below in the visit note. 

## 2014-08-14 NOTE — Assessment & Plan Note (Addendum)
Followed by Dr Juleen China.

## 2014-08-14 NOTE — Progress Notes (Signed)
BP 106/58 mmHg  Pulse 70  Temp(Src) 97.6 F (36.4 C) (Oral)  Wt 165 lb 12 oz (75.184 kg)  SpO2 99%   CC: 6 mo f/u visit  Subjective:    Patient ID: Charles Rodgers, male    DOB: 06/24/1924, 81 y.o.   MRN: [id number]  HPI: Charles Rodgers is a 81 y.o. male presenting on 08/14/2014 for 6 mos FU   Known CKD stage 3 followed by Dr Juleen China - last GFR 38 two mo ago with Cr 1.6.   DM - regularly does check sugars BID. 117 this morning.  Compliant with antihyperglycemic regimen which includes: diet controlled. Denies low sugars or hypoglycemic symptoms. Denies paresthesias. Last diabetic eye exam 09/2013.  Pneumovax: 2013.  Prevnar: 2015. Lab Results  Component Value Date   HGBA1C 6.3 08/09/2014    Losing weight without trying - no appetite.  Wt Readings from Last 3 Encounters:  08/14/14 165 lb 12 oz (75.184 kg)  03/05/14 172 lb 4 oz (78.132 kg)  08/01/13 183 lb (83.008 kg)   Body mass index is 25.01 kg/(m^2).   Relevant past medical, surgical, family and social history reviewed and updated as indicated. Interim medical history since our last visit reviewed. Allergies and medications reviewed and updated. Current Outpatient Prescriptions on File Prior to Visit  Medication Sig  . aspirin (ASPIRIN EC) 81 MG EC tablet Take 81 mg by mouth daily. Swallow whole.  . Blood Glucose Monitoring Suppl (ONE TOUCH ULTRA 2) W/DEVICE KIT   . brimonidine (ALPHAGAN) 0.2 % ophthalmic solution Place 1 drop into both eyes 2 (two) times daily.  . Calcium Carbonate-Vitamin D (CALTRATE 600+D PO) Take by mouth daily.  Sarajane Marek Sodium 30-100 MG CAPS Take by mouth daily.  . diclofenac sodium (VOLTAREN) 1 % GEL Apply 1 application topically 3 (three) times daily.  Marland Kitchen doxazosin (CARDURA) 8 MG tablet TAKE 1 TABLET BY MOUTH EVERY NIGHT AT BEDTIME  . fish oil-omega-3 fatty acids 1000 MG capsule Take 2 g by mouth daily.  . furosemide (LASIX) 20 MG tablet Take 20 mg by mouth daily.  Marland Kitchen  latanoprost (XALATAN) 0.005 % ophthalmic solution   . lisinopril-hydrochlorothiazide (PRINZIDE,ZESTORETIC) 20-25 MG per tablet Take 1 tablet by mouth daily.  . Multiple Vitamins-Minerals (PRESERVISION AREDS) CAPS Take 2 capsules by mouth daily.  . ONE TOUCH ULTRA TEST test strip USE TO CHECK BLOOD SUGAR 3 TIMES A DAY  . ONETOUCH DELICA LANCETS MISC Use as directed  . potassium chloride (K-DUR) 10 MEQ tablet TAKE 1 TABLET BY MOUTH DAILY AS NEEDED  . pravastatin (PRAVACHOL) 40 MG tablet TAKE 1 TABLET BY MOUTH EVERY NIGHT AT BEDTIME  . timolol (BETIMOL) 0.5 % ophthalmic solution Place 1 drop into both eyes 2 (two) times daily.   No current facility-administered medications on file prior to visit.    Review of Systems Per HPI unless specifically indicated above     Objective:    BP 106/58 mmHg  Pulse 70  Temp(Src) 97.6 F (36.4 C) (Oral)  Wt 165 lb 12 oz (75.184 kg)  SpO2 99%  Wt Readings from Last 3 Encounters:  08/14/14 165 lb 12 oz (75.184 kg)  03/05/14 172 lb 4 oz (78.132 kg)  08/01/13 183 lb (83.008 kg)    Physical Exam  Constitutional: He appears well-developed and well-nourished. No distress.  HENT:  Mouth/Throat: Oropharynx is clear and moist. No oropharyngeal exudate.  Neck: Normal range of motion. Neck supple. No thyromegaly present.  Cardiovascular: Normal rate, regular rhythm,  normal heart sounds and intact distal pulses.   No murmur heard. Pulmonary/Chest: Effort normal and breath sounds normal. No respiratory distress. He has no wheezes. He has no rales.  Abdominal: Soft. Bowel sounds are normal. He exhibits no distension and no mass. There is no tenderness. There is no rebound and no guarding.  Tick attached to skin on right abdomen - removed in its entirety with forceps and 25g needle use. Area cleaned with alcohol and dressed with abx ointment/bandaid  Musculoskeletal: He exhibits no edema.  Lymphadenopathy:    He has no cervical adenopathy.  Skin: Skin is warm  and dry. No rash noted.  Upper body skin survey conducted without further ticks - declined lower body eval, states wife will check  Psychiatric: He has a normal mood and affect.  Nursing note and vitals reviewed.  Results for orders placed or performed in visit on 08/14/14  Comprehensive metabolic panel  Result Value Ref Range   Sodium 137 135 - 145 mEq/L   Potassium 4.3 3.5 - 5.1 mEq/L   Chloride 107 96 - 112 mEq/L   CO2 24 19 - 32 mEq/L   Glucose, Bld 121 (H) 70 - 99 mg/dL   BUN 45 (H) 6 - 23 mg/dL   Creatinine, Ser 1.86 (H) 0.40 - 1.50 mg/dL   Total Bilirubin 0.4 0.2 - 1.2 mg/dL   Alkaline Phosphatase 68 39 - 117 U/L   AST 23 0 - 37 U/L   ALT 19 0 - 53 U/L   Total Protein 7.0 6.0 - 8.3 g/dL   Albumin 4.0 3.5 - 5.2 g/dL   Calcium 9.3 8.4 - 10.5 mg/dL   GFR 36.64 (L) >60.00 mL/min  IBC panel  Result Value Ref Range   Iron 66 42 - 165 ug/dL   Transferrin 218.0 212.0 - 360.0 mg/dL   Saturation Ratios 21.6 20.0 - 50.0 %  Ferritin  Result Value Ref Range   Ferritin 41.0 22.0 - 322.0 ng/mL  POCT Urinalysis Dipstick  Result Value Ref Range   Color, UA Yellow    Clarity, UA Clear    Glucose, UA Neg    Bilirubin, UA Neg    Ketones, UA Neg    Spec Grav, UA >=1.030    Blood, UA Neg    pH, UA 6.0    Protein, UA Neg    Urobilinogen, UA 0.2    Nitrite, UA Neg    Leukocytes, UA Negative       Assessment & Plan:   Problem List Items Addressed This Visit    Anemia in chronic kidney disease    Hgb 9.8 recently - thought related to CKD. Will check iron levels today.      Relevant Orders   IBC panel (Completed)   Ferritin (Completed)   CKD (chronic kidney disease) stage 3, GFR 30-59 ml/min    Followed by Dr Juleen China.      Relevant Orders   Serum protein electrophoresis with reflex   HTN (hypertension)    bp low today - likely related to weight loss. Will decrease amlodipine to 89m daily - new dose sent to pharmacy.      Relevant Medications   amLODipine (NORVASC) 5 MG  tablet   Loss of weight - Primary    18 lb unexpected weight loss noted over the past year. Pt endorses decreased appetite but no malaise or discomfort. In anemia, CKD and chronic thrombocytopenia - check SPEP and periph smear.  Check UA and CXR today as well.  Relevant Orders   POCT Urinalysis Dipstick (Completed)   Comprehensive metabolic panel (Completed)   Serum protein electrophoresis with reflex   DG Chest 2 View (Completed)   Tick bite with subsequent removal of tick    Tick removed - jaw remained, finally fully removed with 25g needle and forceps.  Given unclear duration, recommended and prescribed 7d doxy course.      Well controlled type 2 diabetes mellitus with peripheral neuropathy    Chronic, stable. Now prediabetes range. Will be due for eye exam soon.          Follow up plan: Return in about 3 months (around 11/14/2014), or as needed, for follow up visit.

## 2014-08-16 ENCOUNTER — Other Ambulatory Visit: Payer: Self-pay | Admitting: Family Medicine

## 2014-08-16 ENCOUNTER — Other Ambulatory Visit (INDEPENDENT_AMBULATORY_CARE_PROVIDER_SITE_OTHER): Payer: Medicare Other

## 2014-08-16 DIAGNOSIS — D696 Thrombocytopenia, unspecified: Secondary | ICD-10-CM | POA: Diagnosis not present

## 2014-08-16 DIAGNOSIS — N189 Chronic kidney disease, unspecified: Secondary | ICD-10-CM | POA: Diagnosis not present

## 2014-08-16 DIAGNOSIS — D631 Anemia in chronic kidney disease: Secondary | ICD-10-CM | POA: Diagnosis not present

## 2014-08-16 LAB — IFE INTERPRETATION

## 2014-08-16 LAB — PROTEIN ELECTROPHORESIS, SERUM, WITH REFLEX
ALBUMIN ELP: 3.9 g/dL (ref 3.8–4.8)
ALPHA-1-GLOBULIN: 0.3 g/dL (ref 0.2–0.3)
Abnormal Protein Band1: 0.3 g/dL
Alpha-2-Globulin: 0.8 g/dL (ref 0.5–0.9)
BETA 2: 0.4 g/dL (ref 0.2–0.5)
BETA GLOBULIN: 0.4 g/dL (ref 0.4–0.6)
Gamma Globulin: 1.2 g/dL (ref 0.8–1.7)
Total Protein, Serum Electrophoresis: 7 g/dL (ref 6.1–8.1)

## 2014-08-16 LAB — IGG, IGA, IGM
IGG (IMMUNOGLOBIN G), SERUM: 1230 mg/dL (ref 650–1600)
IGM, SERUM: 125 mg/dL (ref 41–251)
IgA: 288 mg/dL (ref 68–379)

## 2014-08-17 ENCOUNTER — Other Ambulatory Visit: Payer: Self-pay | Admitting: Family Medicine

## 2014-08-17 LAB — PATHOLOGIST SMEAR REVIEW

## 2014-08-27 ENCOUNTER — Encounter: Payer: Self-pay | Admitting: Family Medicine

## 2014-08-27 DIAGNOSIS — D472 Monoclonal gammopathy: Secondary | ICD-10-CM

## 2014-08-27 HISTORY — DX: Monoclonal gammopathy: D47.2

## 2014-08-30 ENCOUNTER — Other Ambulatory Visit: Payer: Self-pay | Admitting: Family Medicine

## 2014-08-30 DIAGNOSIS — N189 Chronic kidney disease, unspecified: Secondary | ICD-10-CM

## 2014-08-30 DIAGNOSIS — D472 Monoclonal gammopathy: Secondary | ICD-10-CM

## 2014-08-30 DIAGNOSIS — D631 Anemia in chronic kidney disease: Secondary | ICD-10-CM

## 2014-08-30 DIAGNOSIS — N183 Chronic kidney disease, stage 3 unspecified: Secondary | ICD-10-CM

## 2014-08-30 DIAGNOSIS — R634 Abnormal weight loss: Secondary | ICD-10-CM

## 2014-09-05 ENCOUNTER — Encounter: Payer: Self-pay | Admitting: Family Medicine

## 2014-09-11 ENCOUNTER — Inpatient Hospital Stay: Payer: Medicare Other | Attending: Hematology and Oncology | Admitting: Hematology and Oncology

## 2014-09-11 ENCOUNTER — Encounter: Payer: Self-pay | Admitting: Hematology and Oncology

## 2014-09-11 VITALS — BP 162/75 | HR 70 | Temp 97.8°F | Resp 18 | Ht 67.52 in | Wt 171.3 lb

## 2014-09-11 DIAGNOSIS — E785 Hyperlipidemia, unspecified: Secondary | ICD-10-CM | POA: Insufficient documentation

## 2014-09-11 DIAGNOSIS — Z79899 Other long term (current) drug therapy: Secondary | ICD-10-CM | POA: Insufficient documentation

## 2014-09-11 DIAGNOSIS — N183 Chronic kidney disease, stage 3 (moderate): Secondary | ICD-10-CM | POA: Diagnosis not present

## 2014-09-11 DIAGNOSIS — Z85820 Personal history of malignant melanoma of skin: Secondary | ICD-10-CM | POA: Diagnosis not present

## 2014-09-11 DIAGNOSIS — E119 Type 2 diabetes mellitus without complications: Secondary | ICD-10-CM | POA: Insufficient documentation

## 2014-09-11 DIAGNOSIS — N2581 Secondary hyperparathyroidism of renal origin: Secondary | ICD-10-CM | POA: Diagnosis not present

## 2014-09-11 DIAGNOSIS — D649 Anemia, unspecified: Secondary | ICD-10-CM | POA: Diagnosis not present

## 2014-09-11 DIAGNOSIS — J449 Chronic obstructive pulmonary disease, unspecified: Secondary | ICD-10-CM | POA: Diagnosis not present

## 2014-09-11 DIAGNOSIS — I129 Hypertensive chronic kidney disease with stage 1 through stage 4 chronic kidney disease, or unspecified chronic kidney disease: Secondary | ICD-10-CM

## 2014-09-11 DIAGNOSIS — D696 Thrombocytopenia, unspecified: Secondary | ICD-10-CM | POA: Diagnosis not present

## 2014-09-11 DIAGNOSIS — M1990 Unspecified osteoarthritis, unspecified site: Secondary | ICD-10-CM | POA: Diagnosis not present

## 2014-09-11 DIAGNOSIS — Z7982 Long term (current) use of aspirin: Secondary | ICD-10-CM | POA: Diagnosis not present

## 2014-09-11 DIAGNOSIS — Z9181 History of falling: Secondary | ICD-10-CM | POA: Diagnosis not present

## 2014-09-11 DIAGNOSIS — D472 Monoclonal gammopathy: Secondary | ICD-10-CM | POA: Diagnosis not present

## 2014-09-11 LAB — CBC WITH DIFFERENTIAL/PLATELET
Basophils Absolute: 0 10*3/uL (ref 0–0.1)
Basophils Relative: 0 %
Eosinophils Absolute: 0.1 10*3/uL (ref 0–0.7)
Eosinophils Relative: 3 %
HCT: 30.4 % — ABNORMAL LOW (ref 40.0–52.0)
Hemoglobin: 9.7 g/dL — ABNORMAL LOW (ref 13.0–18.0)
Lymphocytes Relative: 16 %
Lymphs Abs: 0.8 10*3/uL — ABNORMAL LOW (ref 1.0–3.6)
MCH: 30.4 pg (ref 26.0–34.0)
MCHC: 32 g/dL (ref 32.0–36.0)
MCV: 95.1 fL (ref 80.0–100.0)
Monocytes Absolute: 0.4 10*3/uL (ref 0.2–1.0)
Monocytes Relative: 9 %
Neutro Abs: 3.8 10*3/uL (ref 1.4–6.5)
Neutrophils Relative %: 72 %
Platelets: 80 10*3/uL — ABNORMAL LOW (ref 150–440)
RBC: 3.19 MIL/uL — ABNORMAL LOW (ref 4.40–5.90)
RDW: 15.2 % — ABNORMAL HIGH (ref 11.5–14.5)
WBC: 5.3 10*3/uL (ref 3.8–10.6)

## 2014-09-11 LAB — IRON AND TIBC
Iron: 54 ug/dL (ref 45–182)
Saturation Ratios: 20 % (ref 17.9–39.5)
TIBC: 265 ug/dL (ref 250–450)
UIBC: 211 ug/dL

## 2014-09-11 LAB — FOLATE: Folate: 26 ng/mL (ref >5.9)

## 2014-09-11 LAB — APTT: aPTT: 34 seconds (ref 24–36)

## 2014-09-11 LAB — VITAMIN B12: Vitamin B-12: 517 pg/mL (ref 180–914)

## 2014-09-11 LAB — RETICULOCYTES
RBC.: 3.19 MIL/uL — ABNORMAL LOW (ref 4.40–5.90)
Retic Count, Absolute: 22.3 10*3/uL (ref 19.0–183.0)
Retic Ct Pct: 0.7 % (ref 0.4–3.1)

## 2014-09-11 LAB — PROTIME-INR
INR: 1.2
Prothrombin Time: 15.4 seconds — ABNORMAL HIGH (ref 11.4–15.0)

## 2014-09-11 LAB — FERRITIN: Ferritin: 47 ng/mL (ref 24–336)

## 2014-09-11 LAB — TSH: TSH: 2.062 u[IU]/mL (ref 0.350–4.500)

## 2014-09-11 LAB — LACTATE DEHYDROGENASE: LDH: 173 U/L (ref 98–192)

## 2014-09-11 NOTE — Progress Notes (Signed)
[facility] day:  09/11/2014  Chief Complaint: Charles Rodgers is an 81 y.o. male with a monoclonal gammopathy who is referred in consultation by Dr. Danise Mina.  HPI: The patient has been followed by Dr. Danise Mina for the past 3 years. He notes a history of kidney disease for 4-5 years.  He states that 6-8 months ago he weighed 222 pounds. Review of clinic records reveals a weight of 183 pounds on 08/01/2013, 172 pounds on 03/05/2014 and 165 pounds on 08/14/2014. He has tried to lose weight by cutting back on sweets and the amount he eats. He has a good appetite.  He states that he went in to see his physician who noticed a decrease in weight. He does note a decrease in energy. He denies any fevers or sweats. He denies any adenopathy bruising or bleeding. He denies any bone pain.  Available labs from 08/14/2014 revealed a BUN of 45, creatinine 1.86 and normal liver function test. Calcium was 9.3 with an albumin of 4 and a protein of 7.0. Iron studies were normal with a saturation of 21.6%. Ferritin was 41.  Urinalysis revealed no protein.  SPEP on 08/15/2014 revealed a 0.3 gm/dL restricted band consistent with a monoclonal protein.  Labs on 02/02/2014 revealed a creatinine of 1.9, total protein 6.7 and albumin 2.9. Creatinine was 1.9 on 09/08/2012.    CBC on 02/02/2014 revealed a hematocrit of 29.4, hemoglobin 9.4, MCV 97.1, platelets 79,000, white count 5800 with an ANC of 3900. CBC on 06/20/2012 revealed a hematocrit 29.5, hemoglobin 9.9, MCV 94.6, platelets 71,000, white count 6900 with an [place] of 5200.  He does note a little pain when raking leaves in his shoulders and ribs. He has arthritis. He has fallen twice. He has not lost consciousness.  He denies any new medications or herbal products up.  He has had no infections.  The patient states that his diet is "off the wall". He rarely eats meat. He melena or hematochezia. He's never had a  colonoscopy.  Past Medical History  Diagnosis Date  . Arthritis   . Diabetes mellitus type 2, controlled   . Glaucoma   . HTN (hypertension)   . HLD (hyperlipidemia)   . CKD (chronic kidney disease) stage 3, GFR 30-59 ml/min     [place]  . Urine incontinence   . BPH (benign prostatic hypertrophy)     with elevated PSA, s/p benign biopsy, followed yearly by [facility] (last seen 01/16/2013)  . Secondary hyperparathyroidism   . Hearing loss   . Melanoma 2014    nose and left hand  . Thyroid nodule 02/2013    right upper lobe - s/p biopsy - benign follicular nodule  . Subclavian artery stenosis, right 2014    found on carotid US, seen [facility], recommended recheck 1 yr at vascular lab with Dr. Delana Meyer  . COPD (chronic obstructive pulmonary disease) 2016    by xray  . IgM monoclonal gammopathy of uncertain significance 08/27/2014    07/2014 suggest rpt 6 mo.    Past Surgical History  Procedure Laterality Date  . Appendectomy  1938  . Arm surgery      Right after trauma  . US echocardiography  06/2007    EF 55%, mod dil LA, mild LVH,mild pulm HTN  . Carotid US  07/2007    WNL  . 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 Biopsy thyroid Right 02/2013  benign follicular nodule    Family History  Problem Relation Age of Onset  . Cancer Mother     stomach  . Cancer Brother     colon  . Cancer Brother     colon  . Cancer Sister     hodgkin's lymphoma  . Cancer Brother     prostate  . Diabetes Brother   . Coronary artery disease Neg Hx   . Stroke Neg Hx     Social History:  reports that he quit smoking about 47 years ago. He has never used smokeless tobacco. He reports that he does not drink alcohol or use illicit drugs.  The patient is accompanied by his brother.  Allergies: No Known Allergies  Current Medications: Current Outpatient Prescriptions  Medication Sig Dispense Refill  . amLODipine (NORVASC) 10 MG tablet     . aspirin  (ASPIRIN EC) 81 MG EC tablet Take 81 mg by mouth daily. Swallow whole.    . Blood Glucose Monitoring Suppl (ONE TOUCH ULTRA 2) W/DEVICE KIT     . brimonidine (ALPHAGAN) 0.2 % ophthalmic solution Place 1 drop into both eyes 2 (two) times daily.    . Calcium Carbonate-Vitamin D (CALTRATE 600+D PO) Take by mouth daily.    Sarajane Marek Sodium 30-100 MG CAPS Take by mouth daily.    . diclofenac sodium (VOLTAREN) 1 % GEL Apply 1 application topically 3 (three) times daily. 1 Tube 1  . dorzolamide-timolol (COSOPT) 22.3-6.8 MG/ML ophthalmic solution     . doxazosin (CARDURA) 8 MG tablet TAKE 1 TABLET BY MOUTH EVERY NIGHT AT BEDTIME 90 tablet 3  . fish oil-omega-3 fatty acids 1000 MG capsule Take 2 g by mouth daily.    . furosemide (LASIX) 20 MG tablet Take 20 mg by mouth daily.    Marland Kitchen latanoprost (XALATAN) 0.005 % ophthalmic solution     . lisinopril-hydrochlorothiazide (PRINZIDE,ZESTORETIC) 20-25 MG per tablet Take 1 tablet by mouth daily. 90 tablet 3  . Multiple Vitamins-Minerals (PRESERVISION AREDS) CAPS Take 2 capsules by mouth daily.    Glory Rosebush DELICA LANCETS MISC Use as directed    . potassium chloride (K-DUR) 10 MEQ tablet TAKE 1 TABLET BY MOUTH DAILY AS NEEDED 30 tablet 3  . pravastatin (PRAVACHOL) 40 MG tablet TAKE 1 TABLET BY MOUTH EVERY NIGHT AT BEDTIME 90 tablet 3  . timolol (BETIMOL) 0.5 % ophthalmic solution Place 1 drop into both eyes 2 (two) times daily.    Marland Kitchen amLODipine (NORVASC) 5 MG tablet Take 1 tablet (5 mg total) by mouth daily. (Patient not taking: Reported on 09/11/2014) 30 tablet 6  . doxycycline (VIBRAMYCIN) 100 MG capsule Take 1 capsule (100 mg total) by mouth 2 (two) times daily. (Patient not taking: Reported on 09/11/2014) 14 capsule 0  . ONE TOUCH ULTRA TEST test strip USE TO CHECK BLOOD SUGAR 3 TIMES DAILY 100 each 3   No current facility-administered medications for this visit.    Review of Systems:  GENERAL:  Feels good.  Active.  No fevers or sweats .   Weight loss of 17 pounds in past 6-8 months. PERFORMANCE STATUS (ECOG):  1 HEENT:  Hard of hearing.  No visual changes, runny nose, sore throat, mouth sores or tenderness. Lungs: No shortness of breath or cough.  No hemoptysis. Cardiac:  No chest pain, palpitations, orthopnea, or PND. GI:  Good appetite.  No nausea, vomiting, diarrhea, constipation, melena or hematochezia. GU:  No urgency, frequency, dysuria, or hematuria. Musculoskeletal:  Shoulder pain with raking  leaves.  No back pain.  No joint pain.  No muscle tenderness. Extremities:  No pain or swelling. Skin:  No rashes or skin changes. Neuro:  No headache, numbness or weakness, balance or coordination issues. Endocrine:  No diabetes, thyroid issues, hot flashes or night sweats. Psych:  No mood changes, depression or anxiety. Pain:  No focal pain. Review of systems:  All other systems reviewed and found to be negative.   Physical Exam: Blood pressure 162/75, pulse 70, temperature 97.8 F (36.6 C), temperature source Tympanic, resp. rate 18, height 5' 7.52" (1.715 m), weight 171 lb 4.8 oz (77.7 kg), SpO2 100 %. GENERAL:  Well developed, well nourished, sitting comfortably in the exam room in no acute distress. MENTAL STATUS:  Alert and oriented to person, place and time. HEAD:  Wearing a black cap.  White hair.  Male pattern baldness.  Normocephalic, atraumatic, face symmetric, no Cushingoid features. EYES:  Glasses.  Brown eyes.  Pupils equal round and reactive to light and accomodation.  No conjunctivitis or scleral icterus. ENT:  Oropharynx clear without lesion.  Tongue normal. Mucous membranes moist.  RESPIRATORY:  Clear to auscultation without rales, wheezes or rhonchi. CARDIOVASCULAR:  Regular rate and rhythm without murmur, rub or gallop. ABDOMEN:  Ventral hernia.  Soft, non-tender, with active bowel sounds, and no hepatosplenomegaly.  No masses. BACK:  No CVA tenderness.  No tenderness on percussion of the back or rib  cage. SKIN:  Changes due to sun exposure.  No rashes, ulcers or lesions. EXTREMITIES: 1+ ankle edema.  No skin discoloration or tenderness.  No palpable cords. LYMPH NODES: No palpable cervical, supraclavicular, axillary or inguinal adenopathy  NEUROLOGICAL: Unremarkable. PSYCH:  Appropriate.   Office Visit on 09/11/2014  Component Date Value Ref Range Status  . WBC 09/11/2014 5.3  3.8 - 10.6 K/uL Final  . RBC 09/11/2014 3.19* 4.40 - 5.90 MIL/uL Final  . Hemoglobin 09/11/2014 9.7* 13.0 - 18.0 g/dL Final  . HCT 09/11/2014 30.4* 40.0 - 52.0 % Final  . MCV 09/11/2014 95.1  80.0 - 100.0 fL Final  . MCH 09/11/2014 30.4  26.0 - 34.0 pg Final  . MCHC 09/11/2014 32.0  32.0 - 36.0 g/dL Final  . RDW 09/11/2014 15.2* 11.5 - 14.5 % Final  . Platelets 09/11/2014 80* 150 - 440 K/uL Final  . Neutrophils Relative % 09/11/2014 72   Final  . Neutro Abs 09/11/2014 3.8  1.4 - 6.5 K/uL Final  . Lymphocytes Relative 09/11/2014 16   Final  . Lymphs Abs 09/11/2014 0.8* 1.0 - 3.6 K/uL Final  . Monocytes Relative 09/11/2014 9   Final  . Monocytes Absolute 09/11/2014 0.4  0.2 - 1.0 K/uL Final  . Eosinophils Relative 09/11/2014 3   Final  . Eosinophils Absolute 09/11/2014 0.1  0 - 0.7 K/uL Final  . Basophils Relative 09/11/2014 0   Final  . Basophils Absolute 09/11/2014 0.0  0 - 0.1 K/uL Final  . Ferritin 09/11/2014 47  24 - 336 ng/mL Final  . Iron 09/11/2014 54  45 - 182 ug/dL Final  . TIBC 09/11/2014 265  250 - 450 ug/dL Final  . Saturation Ratios 09/11/2014 20  17.9 - 39.5 % Final  . UIBC 09/11/2014 211   Final  . Retic Ct Pct 09/11/2014 0.7  0.4 - 3.1 % Final  . RBC. 09/11/2014 3.19* 4.40 - 5.90 MIL/uL Final  . Retic Count, Manual 09/11/2014 22.3  19.0 - 183.0 K/uL Final  . Folate 09/11/2014 26.0  >5.9 ng/mL Final  .  TSH 09/11/2014 2.062  0.350 - 4.500 uIU/mL Final  . LDH 09/11/2014 173  98 - 192 U/L Final  . aPTT 09/11/2014 34  24 - 36 seconds Final  . Prothrombin Time 09/11/2014 15.4* 11.4 - 15.0  seconds Final  . INR 09/11/2014 1.20   Final    Assessment:  Charles Rodgers is an 81 y.o. male with a 2 year history of a normocytic anemia, thrombocytopenia and a normal white count. Serum protein electrophoresis on 08/15/2014 revealed a 0.3 g/dL monoclonal spike. He has chronic renal insufficiency (Cr 1.7-2.03) over the past 2 years without trend.  He denies any new medications or herbal products.  He has lost 12 pounds in the past year.  He notes a change in his diet.  Diet rarely includes meat. Appetite is good.  He denies any melena or hematochezia. He has never had a colonoscopy.  Exam is unremarkable.  Plan: 1. Discuss blood counts, renal insufficiency, and monoclonal gammopathy. Discuss monoclonal gammopathy of unknown significance (MGUS) versus multiple myeloma.  2. Labs today:  CBC with diff, SPEP, light chains, immunoglobulins (IgG, IgA, IgM), LDH, ANA with reflex, PT, PTT, ferritin, iron studies, B12, folate, TSH 3. 24 hour urine for UPEP and free light chains. 4. Guaiac cards x 3.  Discuss colonoscopy 5. Bone survey- r/o lytic lesions. 6. RTC in 10 days for MD assessment and review of studies.   Lequita Asal, MD  09/11/2014, 5:45 PM

## 2014-09-12 LAB — PROTEIN ELECTROPHORESIS, SERUM
A/G Ratio: 1.2 (ref 0.7–1.7)
Albumin ELP: 3.7 g/dL (ref 2.9–4.4)
Alpha-1-Globulin: 0.2 g/dL (ref 0.0–0.4)
Alpha-2-Globulin: 0.7 g/dL (ref 0.4–1.0)
Beta Globulin: 0.9 g/dL (ref 0.7–1.3)
Gamma Globulin: 1.2 g/dL (ref 0.4–1.8)
Globulin, Total: 3.1 g/dL (ref 2.2–3.9)
M-Spike, %: 0.4 g/dL — ABNORMAL HIGH
Total Protein ELP: 6.8 g/dL (ref 6.0–8.5)

## 2014-09-12 LAB — KAPPA/LAMBDA LIGHT CHAINS
Kappa free light chain: 147.45 mg/L — ABNORMAL HIGH (ref 3.30–19.40)
Kappa, lambda light chain ratio: 4.56 — ABNORMAL HIGH (ref 0.26–1.65)
Lambda free light chains: 32.32 mg/L — ABNORMAL HIGH (ref 5.71–26.30)

## 2014-09-12 LAB — ANA W/REFLEX IF POSITIVE: Anti Nuclear Antibody(ANA): NEGATIVE

## 2014-09-13 LAB — IGG, IGA, IGM
IgA: 307 mg/dL (ref 61–437)
IgG (Immunoglobin G), Serum: 1165 mg/dL (ref 700–1600)
IgM, Serum: 131 mg/dL (ref 15–143)

## 2014-09-14 ENCOUNTER — Other Ambulatory Visit: Payer: Self-pay

## 2014-09-14 DIAGNOSIS — D472 Monoclonal gammopathy: Secondary | ICD-10-CM

## 2014-09-15 DIAGNOSIS — Z79899 Other long term (current) drug therapy: Secondary | ICD-10-CM | POA: Diagnosis not present

## 2014-09-15 DIAGNOSIS — I129 Hypertensive chronic kidney disease with stage 1 through stage 4 chronic kidney disease, or unspecified chronic kidney disease: Secondary | ICD-10-CM | POA: Diagnosis not present

## 2014-09-15 DIAGNOSIS — D472 Monoclonal gammopathy: Secondary | ICD-10-CM | POA: Diagnosis not present

## 2014-09-15 DIAGNOSIS — D696 Thrombocytopenia, unspecified: Secondary | ICD-10-CM | POA: Diagnosis not present

## 2014-09-15 DIAGNOSIS — J449 Chronic obstructive pulmonary disease, unspecified: Secondary | ICD-10-CM | POA: Diagnosis not present

## 2014-09-15 DIAGNOSIS — Z85820 Personal history of malignant melanoma of skin: Secondary | ICD-10-CM | POA: Diagnosis not present

## 2014-09-15 DIAGNOSIS — M1990 Unspecified osteoarthritis, unspecified site: Secondary | ICD-10-CM | POA: Diagnosis not present

## 2014-09-15 DIAGNOSIS — E785 Hyperlipidemia, unspecified: Secondary | ICD-10-CM | POA: Diagnosis not present

## 2014-09-15 DIAGNOSIS — D649 Anemia, unspecified: Secondary | ICD-10-CM | POA: Diagnosis not present

## 2014-09-15 DIAGNOSIS — E119 Type 2 diabetes mellitus without complications: Secondary | ICD-10-CM | POA: Diagnosis not present

## 2014-09-15 DIAGNOSIS — Z7982 Long term (current) use of aspirin: Secondary | ICD-10-CM | POA: Diagnosis not present

## 2014-09-15 DIAGNOSIS — N183 Chronic kidney disease, stage 3 (moderate): Secondary | ICD-10-CM | POA: Diagnosis not present

## 2014-09-15 DIAGNOSIS — N2581 Secondary hyperparathyroidism of renal origin: Secondary | ICD-10-CM | POA: Diagnosis not present

## 2014-09-15 DIAGNOSIS — Z9181 History of falling: Secondary | ICD-10-CM | POA: Diagnosis not present

## 2014-09-16 DIAGNOSIS — N183 Chronic kidney disease, stage 3 (moderate): Secondary | ICD-10-CM | POA: Diagnosis not present

## 2014-09-16 DIAGNOSIS — Z9181 History of falling: Secondary | ICD-10-CM | POA: Diagnosis not present

## 2014-09-16 DIAGNOSIS — Z85820 Personal history of malignant melanoma of skin: Secondary | ICD-10-CM | POA: Diagnosis not present

## 2014-09-16 DIAGNOSIS — D649 Anemia, unspecified: Secondary | ICD-10-CM | POA: Diagnosis not present

## 2014-09-16 DIAGNOSIS — J449 Chronic obstructive pulmonary disease, unspecified: Secondary | ICD-10-CM | POA: Diagnosis not present

## 2014-09-16 DIAGNOSIS — I129 Hypertensive chronic kidney disease with stage 1 through stage 4 chronic kidney disease, or unspecified chronic kidney disease: Secondary | ICD-10-CM | POA: Diagnosis not present

## 2014-09-16 DIAGNOSIS — M1990 Unspecified osteoarthritis, unspecified site: Secondary | ICD-10-CM | POA: Diagnosis not present

## 2014-09-16 DIAGNOSIS — N2581 Secondary hyperparathyroidism of renal origin: Secondary | ICD-10-CM | POA: Diagnosis not present

## 2014-09-16 DIAGNOSIS — D472 Monoclonal gammopathy: Secondary | ICD-10-CM | POA: Diagnosis not present

## 2014-09-16 DIAGNOSIS — E119 Type 2 diabetes mellitus without complications: Secondary | ICD-10-CM | POA: Diagnosis not present

## 2014-09-16 DIAGNOSIS — E785 Hyperlipidemia, unspecified: Secondary | ICD-10-CM | POA: Diagnosis not present

## 2014-09-16 DIAGNOSIS — D696 Thrombocytopenia, unspecified: Secondary | ICD-10-CM | POA: Diagnosis not present

## 2014-09-16 DIAGNOSIS — Z79899 Other long term (current) drug therapy: Secondary | ICD-10-CM | POA: Diagnosis not present

## 2014-09-16 DIAGNOSIS — Z7982 Long term (current) use of aspirin: Secondary | ICD-10-CM | POA: Diagnosis not present

## 2014-09-17 ENCOUNTER — Other Ambulatory Visit: Payer: Self-pay

## 2014-09-17 DIAGNOSIS — D649 Anemia, unspecified: Secondary | ICD-10-CM

## 2014-09-17 LAB — UPEP/TP, 24-HR URINE
Albumin, U: 23.4 %
Alpha 1, Urine: 2.3 %
Alpha 2, Urine: 12.1 %
Beta, Urine: 54.4 %
Gamma Globulin, Urine: 7.9 %
Total Protein, Urine-Ur/day: 67 mg/24 hr (ref 30.0–150.0)
Total Protein, Urine: 4 mg/dL
Total Volume: 1675

## 2014-09-17 LAB — OCCULT BLOOD X 1 CARD TO LAB, STOOL
Fecal Occult Bld: NEGATIVE
Fecal Occult Bld: NEGATIVE

## 2014-09-17 LAB — MISC LABCORP TEST (SEND OUT): Labcorp test code: 121228

## 2014-09-28 ENCOUNTER — Telehealth: Payer: Self-pay | Admitting: *Deleted

## 2014-09-28 NOTE — Telephone Encounter (Signed)
Reviewed the labs with the patient's wife. However, I explained that Dr. Zenia Resides needs to interpret the kappa light chain abnormal labs. I will have Dr. Zenia Resides call the patient or set up an apt to discuss these results.

## 2014-10-02 NOTE — Telephone Encounter (Signed)
  Patient was seen for initial consult.  He was to return to pull together all of his studies.  He was scheduled to have a follow-up visit.  I don't know what happed to that appt.  Can we get him on the schedule?  M

## 2014-10-03 ENCOUNTER — Telehealth: Payer: Self-pay

## 2014-10-03 ENCOUNTER — Ambulatory Visit
Admission: RE | Admit: 2014-10-03 | Discharge: 2014-10-03 | Disposition: A | Discharge status: Home / Self Care | Payer: Medicare Other | Source: Ambulatory Visit | Attending: Hematology and Oncology | Admitting: Hematology and Oncology

## 2014-10-03 DIAGNOSIS — M47816 Spondylosis without myelopathy or radiculopathy, lumbar region: Secondary | ICD-10-CM | POA: Insufficient documentation

## 2014-10-03 DIAGNOSIS — M47814 Spondylosis without myelopathy or radiculopathy, thoracic region: Secondary | ICD-10-CM | POA: Insufficient documentation

## 2014-10-03 DIAGNOSIS — D472 Monoclonal gammopathy: Secondary | ICD-10-CM | POA: Diagnosis not present

## 2014-10-03 DIAGNOSIS — M47812 Spondylosis without myelopathy or radiculopathy, cervical region: Secondary | ICD-10-CM | POA: Insufficient documentation

## 2014-10-03 NOTE — Telephone Encounter (Signed)
Spoke with pt and he stated he did not know when he would get this done however he stated he would call our office once complete to make follow up appointment with you to review his results

## 2014-10-03 NOTE — Telephone Encounter (Signed)
After discussing with Dr. Mike Gip, called pt and pt did not know a bone survey was ordered therefore has not had that done; instructed we would get that scheduled and then schedule a follow up appointment with Dr. Mike Gip to review; py verbalized understanding of this plan

## 2014-10-03 NOTE — Telephone Encounter (Signed)
Spoke with patient and he will go have his bone survey done then notify our office so we can make a follow up appointment with Dr. Mike Gip to review; orders in per Dr. Mike Gip

## 2014-10-03 NOTE — Telephone Encounter (Signed)
  Did this appt get set up?  M

## 2014-10-15 DIAGNOSIS — H4011X0 Primary open-angle glaucoma, stage unspecified: Secondary | ICD-10-CM | POA: Diagnosis not present

## 2014-10-19 DIAGNOSIS — H3531 Nonexudative age-related macular degeneration: Secondary | ICD-10-CM | POA: Diagnosis not present

## 2014-10-26 ENCOUNTER — Telehealth: Payer: Self-pay

## 2014-10-26 NOTE — Telephone Encounter (Signed)
Spoke with pt's brother about upcoming appts.  Let pt's brother know that I will inform scheduling to set up a follow-up for pt with Dr. Mike Gip.

## 2014-11-06 DIAGNOSIS — L570 Actinic keratosis: Secondary | ICD-10-CM | POA: Diagnosis not present

## 2014-11-06 DIAGNOSIS — C4362 Malignant melanoma of left upper limb, including shoulder: Secondary | ICD-10-CM | POA: Diagnosis not present

## 2014-11-06 DIAGNOSIS — D485 Neoplasm of uncertain behavior of skin: Secondary | ICD-10-CM | POA: Diagnosis not present

## 2014-11-06 DIAGNOSIS — C44329 Squamous cell carcinoma of skin of other parts of face: Secondary | ICD-10-CM | POA: Diagnosis not present

## 2014-11-06 DIAGNOSIS — D0461 Carcinoma in situ of skin of right upper limb, including shoulder: Secondary | ICD-10-CM | POA: Diagnosis not present

## 2014-11-12 DIAGNOSIS — C4362 Malignant melanoma of left upper limb, including shoulder: Secondary | ICD-10-CM | POA: Diagnosis not present

## 2014-11-14 ENCOUNTER — Ambulatory Visit (INDEPENDENT_AMBULATORY_CARE_PROVIDER_SITE_OTHER): Payer: Medicare Other | Admitting: Family Medicine

## 2014-11-14 ENCOUNTER — Encounter: Payer: Self-pay | Admitting: Family Medicine

## 2014-11-14 VITALS — BP 114/64 | HR 64 | Temp 98.1°F | Wt 170.5 lb

## 2014-11-14 DIAGNOSIS — D472 Monoclonal gammopathy: Secondary | ICD-10-CM | POA: Diagnosis not present

## 2014-11-14 DIAGNOSIS — R634 Abnormal weight loss: Secondary | ICD-10-CM

## 2014-11-14 DIAGNOSIS — C4362 Malignant melanoma of left upper limb, including shoulder: Secondary | ICD-10-CM

## 2014-11-14 DIAGNOSIS — C4360 Malignant melanoma of unspecified upper limb, including shoulder: Secondary | ICD-10-CM | POA: Insufficient documentation

## 2014-11-14 HISTORY — DX: Malignant melanoma of left upper limb, including shoulder: C43.62

## 2014-11-14 NOTE — Assessment & Plan Note (Addendum)
Discussed with patient - pathology with uninvolved edges, pT2b stage. He is in process of being referred to skin surgeon for further excision.

## 2014-11-14 NOTE — Progress Notes (Signed)
Pre visit review using our clinic review tool, if applicable. No additional management support is needed unless otherwise documented below in the visit note. 

## 2014-11-14 NOTE — Assessment & Plan Note (Addendum)
Weight gain noted over last 3 months.  Encouraged he start boost/ensure supplement. Return in 3 mo for f/u and continued monitoring.

## 2014-11-14 NOTE — Progress Notes (Signed)
BP 114/64 mmHg  Pulse 64  Temp(Src) 98.1 F (36.7 C) (Oral)  Wt 170 lb 8 oz (77.338 kg)   CC: f/u visit  Subjective:    Patient ID: Charles Rodgers, male    DOB: September 25, 1924, 81 y.o.   MRN: [id number]  HPI: Charles Rodgers is a 81 y.o. male presenting on 11/14/2014 for Follow-up and Skin Cancer   Presents with brother today.  Weight loss - 183 lbs 07/2013. Down to 165lb 07/2014. Has gained 5 lbs in last 3 months - today 170lbs. Previously attributed to decreased appetite but now improving. Workup showed CXR with COPD and labwork showing anemia and IgM kappa monoclonal gammopathy - referred to oncology 08/2014 - initial eval done and is scheduled to f/u with Dr Mike Gip tomorrow.   Brings skin biopsy from derm (Dr Phillip Heal in [facility]) showing invasive malignant melanoma (L anterior upper arm) as well as 2 squamous cell cancers. H/o melanoma L hand and nose 2014 s/p surgery [facility] Snuffer).  Tick attached 07/2013 - tick removed and treated with 7d doxy course. Denies any symptoms, feels well. Denies fevers/chills, bone pains.   Relevant past medical, surgical, family and social history reviewed and updated as indicated. Interim medical history since our last visit reviewed. Allergies and medications reviewed and updated. Current Outpatient Prescriptions on File Prior to Visit  Medication Sig  . amLODipine (NORVASC) 10 MG tablet   . aspirin (ASPIRIN EC) 81 MG EC tablet Take 81 mg by mouth daily. Swallow whole.  . Blood Glucose Monitoring Suppl (ONE TOUCH ULTRA 2) W/DEVICE KIT   . brimonidine (ALPHAGAN) 0.2 % ophthalmic solution Place 1 drop into both eyes 2 (two) times daily.  . Calcium Carbonate-Vitamin D (CALTRATE 600+D PO) Take by mouth daily.  Sarajane Marek Sodium 30-100 MG CAPS Take by mouth daily.  . dorzolamide-timolol (COSOPT) 22.3-6.8 MG/ML ophthalmic solution   . doxazosin (CARDURA) 8 MG tablet TAKE 1 TABLET BY MOUTH EVERY NIGHT AT BEDTIME  . fish oil-omega-3  fatty acids 1000 MG capsule Take 2 g by mouth daily.  . furosemide (LASIX) 20 MG tablet Take 20 mg by mouth daily.  Marland Kitchen latanoprost (XALATAN) 0.005 % ophthalmic solution   . lisinopril-hydrochlorothiazide (PRINZIDE,ZESTORETIC) 20-25 MG per tablet Take 1 tablet by mouth daily.  . Multiple Vitamins-Minerals (PRESERVISION AREDS) CAPS Take 2 capsules by mouth daily.  . ONE TOUCH ULTRA TEST test strip USE TO CHECK BLOOD SUGAR 3 TIMES DAILY  . ONETOUCH DELICA LANCETS MISC Use as directed  . potassium chloride (K-DUR) 10 MEQ tablet TAKE 1 TABLET BY MOUTH DAILY AS NEEDED  . pravastatin (PRAVACHOL) 40 MG tablet TAKE 1 TABLET BY MOUTH EVERY NIGHT AT BEDTIME  . timolol (BETIMOL) 0.5 % ophthalmic solution Place 1 drop into both eyes 2 (two) times daily.   No current facility-administered medications on file prior to visit.    Review of Systems Per HPI unless specifically indicated above     Objective:    BP 114/64 mmHg  Pulse 64  Temp(Src) 98.1 F (36.7 C) (Oral)  Wt 170 lb 8 oz (77.338 kg)  Wt Readings from Last 3 Encounters:  11/14/14 170 lb 8 oz (77.338 kg)  09/11/14 171 lb 4.8 oz (77.7 kg)  08/14/14 165 lb 12 oz (75.184 kg)    Physical Exam  Constitutional: He appears well-developed and well-nourished. No distress.  HENT:  Mouth/Throat: Oropharynx is clear and moist. No oropharyngeal exudate.  Hard of hearing  Cardiovascular: Normal rate, regular rhythm, normal heart sounds and  intact distal pulses.   No murmur heard. Pulmonary/Chest: Effort normal and breath sounds normal. No respiratory distress. He has no wheezes. He has no rales.  Abdominal: Soft. Normal appearance and bowel sounds are normal. He exhibits no distension. There is no hepatosplenomegaly. There is no tenderness. There is no rebound and no guarding.  Musculoskeletal: He exhibits no edema.  Skin: Skin is warm and dry. No rash noted.  Scab L upper arm as well as L temple and R dorsal hand  Psychiatric: He has a normal  mood and affect.  Nursing note and vitals reviewed.  Results for orders placed or performed in visit on 09/17/14  Occult blood card to lab, stool  Result Value Ref Range   Fecal Occult Bld NEGATIVE NEGATIVE  Occult blood card to lab, stool  Result Value Ref Range   Fecal Occult Bld NEGATIVE NEGATIVE      Assessment & Plan:   Problem List Items Addressed This Visit    Loss of weight    Weight gain noted over last 3 months.  Encouraged he start boost/ensure supplement. Return in 3 mo for f/u and continued monitoring.      IgM monoclonal gammopathy of uncertain significance    He and brother were somewhat upset at lack of follow up by oncology office. Encouraged he keep tomorrow's appointment with Dr Mike Gip to review recent labwork, bone scan as it was abnormal. Explained melanoma dx and reason I referred him to oncology are likely unrelated.  Questions answered.      Malignant melanoma of left upper arm - Primary    Discussed with patient - pathology with uninvolved edges, pT2b stage. He is in process of being referred to skin surgeon for further excision.           Follow up plan: Return in about 3 months (around 02/14/2015), or as needed, for follow up visit.

## 2014-11-14 NOTE — Assessment & Plan Note (Addendum)
He and brother were somewhat upset at lack of follow up by oncology office. Encouraged he keep tomorrow's appointment with Dr Mike Gip to review recent labwork, bone scan as it was abnormal. Explained melanoma dx and reason I referred him to oncology are likely unrelated.  Questions answered.

## 2014-11-14 NOTE — Patient Instructions (Addendum)
Keep appointment with Dr Gerrit Friends for tomorrow. Look into starting a boost or ensure shake 30 minutes prior to lunch to help appetite.  Return to see me in 3 months for follow up.

## 2014-11-15 ENCOUNTER — Telehealth: Payer: Self-pay

## 2014-11-15 ENCOUNTER — Inpatient Hospital Stay: Payer: Medicare Other | Attending: Hematology and Oncology | Admitting: Hematology and Oncology

## 2014-11-15 ENCOUNTER — Encounter: Payer: Self-pay | Admitting: Hematology and Oncology

## 2014-11-15 ENCOUNTER — Inpatient Hospital Stay: Payer: Medicare Other

## 2014-11-15 VITALS — BP 157/68 | HR 78 | Temp 99.1°F | Resp 18 | Wt 171.5 lb

## 2014-11-15 DIAGNOSIS — E119 Type 2 diabetes mellitus without complications: Secondary | ICD-10-CM | POA: Diagnosis not present

## 2014-11-15 DIAGNOSIS — N189 Chronic kidney disease, unspecified: Secondary | ICD-10-CM

## 2014-11-15 DIAGNOSIS — D631 Anemia in chronic kidney disease: Secondary | ICD-10-CM

## 2014-11-15 DIAGNOSIS — J449 Chronic obstructive pulmonary disease, unspecified: Secondary | ICD-10-CM | POA: Insufficient documentation

## 2014-11-15 DIAGNOSIS — R634 Abnormal weight loss: Secondary | ICD-10-CM

## 2014-11-15 DIAGNOSIS — D472 Monoclonal gammopathy: Secondary | ICD-10-CM | POA: Insufficient documentation

## 2014-11-15 DIAGNOSIS — D649 Anemia, unspecified: Secondary | ICD-10-CM | POA: Diagnosis not present

## 2014-11-15 DIAGNOSIS — N400 Enlarged prostate without lower urinary tract symptoms: Secondary | ICD-10-CM | POA: Insufficient documentation

## 2014-11-15 DIAGNOSIS — I129 Hypertensive chronic kidney disease with stage 1 through stage 4 chronic kidney disease, or unspecified chronic kidney disease: Secondary | ICD-10-CM

## 2014-11-15 DIAGNOSIS — C4362 Malignant melanoma of left upper limb, including shoulder: Secondary | ICD-10-CM | POA: Insufficient documentation

## 2014-11-15 DIAGNOSIS — Z87891 Personal history of nicotine dependence: Secondary | ICD-10-CM | POA: Diagnosis not present

## 2014-11-15 DIAGNOSIS — Z79899 Other long term (current) drug therapy: Secondary | ICD-10-CM | POA: Diagnosis not present

## 2014-11-15 DIAGNOSIS — E785 Hyperlipidemia, unspecified: Secondary | ICD-10-CM | POA: Insufficient documentation

## 2014-11-15 DIAGNOSIS — Z7982 Long term (current) use of aspirin: Secondary | ICD-10-CM | POA: Diagnosis not present

## 2014-11-15 DIAGNOSIS — D696 Thrombocytopenia, unspecified: Secondary | ICD-10-CM | POA: Diagnosis not present

## 2014-11-15 DIAGNOSIS — N183 Chronic kidney disease, stage 3 (moderate): Secondary | ICD-10-CM | POA: Insufficient documentation

## 2014-11-15 NOTE — Telephone Encounter (Signed)
Called pt's brother per pt to give lab results. Pts brother verbalized an understanding.  No concerns noted at this time.  Asked for results

## 2014-11-19 DIAGNOSIS — C4362 Malignant melanoma of left upper limb, including shoulder: Secondary | ICD-10-CM | POA: Diagnosis not present

## 2014-11-19 DIAGNOSIS — Z85820 Personal history of malignant melanoma of skin: Secondary | ICD-10-CM | POA: Insufficient documentation

## 2014-11-22 DIAGNOSIS — C439 Malignant melanoma of skin, unspecified: Secondary | ICD-10-CM | POA: Diagnosis not present

## 2014-11-26 LAB — CBC WITH DIFFERENTIAL/PLATELET
Basophils Absolute: 0 10*3/uL (ref 0–0.1)
Basophils Relative: 0 %
Eosinophils Absolute: 0.1 10*3/uL (ref 0–0.7)
Eosinophils Relative: 2 %
HCT: 29.7 % — ABNORMAL LOW (ref 40.0–52.0)
Hemoglobin: 9.6 g/dL — ABNORMAL LOW (ref 13.0–18.0)
Lymphocytes Relative: 17 %
Lymphs Abs: 1 10*3/uL (ref 1.0–3.6)
MCH: 30.4 pg (ref 26.0–34.0)
MCHC: 32.2 g/dL (ref 32.0–36.0)
MCV: 94.4 fL (ref 80.0–100.0)
Monocytes Absolute: 0.5 10*3/uL (ref 0.2–1.0)
Monocytes Relative: 9 %
Neutro Abs: 4.3 10*3/uL (ref 1.4–6.5)
Neutrophils Relative %: 72 %
Platelets: 84 10*3/uL — ABNORMAL LOW (ref 150–440)
RBC: 3.14 MIL/uL — ABNORMAL LOW (ref 4.40–5.90)
RDW: 14.6 % — ABNORMAL HIGH (ref 11.5–14.5)
WBC: 5.9 10*3/uL (ref 3.8–10.6)

## 2014-11-27 DIAGNOSIS — L905 Scar conditions and fibrosis of skin: Secondary | ICD-10-CM | POA: Diagnosis not present

## 2014-11-27 DIAGNOSIS — N183 Chronic kidney disease, stage 3 (moderate): Secondary | ICD-10-CM | POA: Diagnosis not present

## 2014-11-27 DIAGNOSIS — C4362 Malignant melanoma of left upper limb, including shoulder: Secondary | ICD-10-CM | POA: Diagnosis not present

## 2014-11-27 DIAGNOSIS — Z87891 Personal history of nicotine dependence: Secondary | ICD-10-CM | POA: Diagnosis not present

## 2014-11-27 DIAGNOSIS — E785 Hyperlipidemia, unspecified: Secondary | ICD-10-CM | POA: Diagnosis not present

## 2014-11-27 DIAGNOSIS — E119 Type 2 diabetes mellitus without complications: Secondary | ICD-10-CM | POA: Diagnosis not present

## 2014-11-27 DIAGNOSIS — H409 Unspecified glaucoma: Secondary | ICD-10-CM | POA: Diagnosis not present

## 2014-11-27 DIAGNOSIS — I129 Hypertensive chronic kidney disease with stage 1 through stage 4 chronic kidney disease, or unspecified chronic kidney disease: Secondary | ICD-10-CM | POA: Diagnosis not present

## 2014-11-27 DIAGNOSIS — C439 Malignant melanoma of skin, unspecified: Secondary | ICD-10-CM | POA: Diagnosis not present

## 2014-11-30 ENCOUNTER — Encounter: Payer: Self-pay | Admitting: Family Medicine

## 2014-12-04 DIAGNOSIS — C4362 Malignant melanoma of left upper limb, including shoulder: Secondary | ICD-10-CM | POA: Diagnosis not present

## 2014-12-04 DIAGNOSIS — E785 Hyperlipidemia, unspecified: Secondary | ICD-10-CM | POA: Diagnosis not present

## 2014-12-11 DIAGNOSIS — C4362 Malignant melanoma of left upper limb, including shoulder: Secondary | ICD-10-CM | POA: Diagnosis not present

## 2014-12-13 ENCOUNTER — Telehealth: Payer: Self-pay | Admitting: Family Medicine

## 2014-12-13 NOTE — Telephone Encounter (Signed)
I'm glad melanoma was taken care of! I want him to keep next appt with nephrology, and ask Dr Juleen China if he's comfortable having pt continue f/u with PCP only. I think that would be ok as long as kidney function doesn't continue to worsen in that case would refer back to Dr Juleen China.

## 2014-12-13 NOTE — Telephone Encounter (Signed)
Patient went to Oncology.  He went to [facility] and had skin cancer removed.  It didn't spread.  Patient has an appointment with his kidney doctor.  Patient said it's a 6 month follow up and he does the same thing Dr.G.does. Patient wants to know if it's necessary for him to see the kidney doctor.

## 2014-12-13 NOTE — Telephone Encounter (Signed)
Attempted to call patient numerous times and line was busy. Will try again later.

## 2014-12-14 NOTE — Telephone Encounter (Signed)
Patient notified and verbalized understanding. 

## 2014-12-21 DIAGNOSIS — I10 Essential (primary) hypertension: Secondary | ICD-10-CM | POA: Diagnosis not present

## 2014-12-21 DIAGNOSIS — E1122 Type 2 diabetes mellitus with diabetic chronic kidney disease: Secondary | ICD-10-CM | POA: Diagnosis not present

## 2014-12-21 DIAGNOSIS — N183 Chronic kidney disease, stage 3 (moderate): Secondary | ICD-10-CM | POA: Diagnosis not present

## 2014-12-21 DIAGNOSIS — D631 Anemia in chronic kidney disease: Secondary | ICD-10-CM | POA: Diagnosis not present

## 2014-12-21 DIAGNOSIS — N2581 Secondary hyperparathyroidism of renal origin: Secondary | ICD-10-CM | POA: Diagnosis not present

## 2014-12-21 LAB — PTH, INTACT: PTH Interp: 55

## 2014-12-21 LAB — IRON AND TIBC
Iron Saturation: 16
Iron: 39
Total Iron Binding Capacity: 246

## 2014-12-21 LAB — HEMOGLOBIN A1C: A1C: 6.4

## 2014-12-21 LAB — CBC
HEMOGLOBIN: 9.3 g/dL
WBC: 5.4
platelet count: 97

## 2014-12-21 LAB — VITAMIN D 25 HYDROXY (VIT D DEFICIENCY, FRACTURES): Vit D, 25-Hydroxy: 27.6

## 2014-12-21 LAB — FERRITIN: Ferritin: 60

## 2014-12-21 LAB — COMPREHENSIVE METABOLIC PANEL
Creat: 1.61
Glucose: 108
Potassium: 3.9 mmol/L

## 2014-12-25 ENCOUNTER — Ambulatory Visit (INDEPENDENT_AMBULATORY_CARE_PROVIDER_SITE_OTHER): Payer: Medicare Other

## 2014-12-25 DIAGNOSIS — Z23 Encounter for immunization: Secondary | ICD-10-CM | POA: Diagnosis not present

## 2014-12-26 ENCOUNTER — Encounter: Payer: Self-pay | Admitting: *Deleted

## 2014-12-28 DIAGNOSIS — C44622 Squamous cell carcinoma of skin of right upper limb, including shoulder: Secondary | ICD-10-CM | POA: Diagnosis not present

## 2014-12-28 DIAGNOSIS — C44329 Squamous cell carcinoma of skin of other parts of face: Secondary | ICD-10-CM | POA: Diagnosis not present

## 2015-01-07 DIAGNOSIS — D485 Neoplasm of uncertain behavior of skin: Secondary | ICD-10-CM | POA: Diagnosis not present

## 2015-01-07 DIAGNOSIS — Z4802 Encounter for removal of sutures: Secondary | ICD-10-CM | POA: Diagnosis not present

## 2015-01-07 DIAGNOSIS — C44329 Squamous cell carcinoma of skin of other parts of face: Secondary | ICD-10-CM | POA: Diagnosis not present

## 2015-01-07 DIAGNOSIS — L570 Actinic keratosis: Secondary | ICD-10-CM | POA: Diagnosis not present

## 2015-01-07 DIAGNOSIS — L720 Epidermal cyst: Secondary | ICD-10-CM | POA: Diagnosis not present

## 2015-01-07 DIAGNOSIS — C44622 Squamous cell carcinoma of skin of right upper limb, including shoulder: Secondary | ICD-10-CM | POA: Diagnosis not present

## 2015-01-22 DIAGNOSIS — C44329 Squamous cell carcinoma of skin of other parts of face: Secondary | ICD-10-CM | POA: Diagnosis not present

## 2015-01-22 DIAGNOSIS — C44622 Squamous cell carcinoma of skin of right upper limb, including shoulder: Secondary | ICD-10-CM | POA: Diagnosis not present

## 2015-01-29 DIAGNOSIS — L570 Actinic keratosis: Secondary | ICD-10-CM | POA: Diagnosis not present

## 2015-02-07 DIAGNOSIS — E119 Type 2 diabetes mellitus without complications: Secondary | ICD-10-CM | POA: Diagnosis not present

## 2015-02-07 DIAGNOSIS — N401 Enlarged prostate with lower urinary tract symptoms: Secondary | ICD-10-CM | POA: Diagnosis not present

## 2015-02-07 DIAGNOSIS — N138 Other obstructive and reflux uropathy: Secondary | ICD-10-CM | POA: Diagnosis not present

## 2015-02-07 DIAGNOSIS — R2232 Localized swelling, mass and lump, left upper limb: Secondary | ICD-10-CM | POA: Diagnosis not present

## 2015-02-07 DIAGNOSIS — L02412 Cutaneous abscess of left axilla: Secondary | ICD-10-CM | POA: Diagnosis not present

## 2015-02-07 DIAGNOSIS — Z87891 Personal history of nicotine dependence: Secondary | ICD-10-CM | POA: Diagnosis not present

## 2015-02-07 DIAGNOSIS — Z7902 Long term (current) use of antithrombotics/antiplatelets: Secondary | ICD-10-CM | POA: Diagnosis not present

## 2015-02-07 DIAGNOSIS — Z79899 Other long term (current) drug therapy: Secondary | ICD-10-CM | POA: Diagnosis not present

## 2015-02-07 DIAGNOSIS — C4362 Malignant melanoma of left upper limb, including shoulder: Secondary | ICD-10-CM | POA: Diagnosis not present

## 2015-02-07 DIAGNOSIS — Z7982 Long term (current) use of aspirin: Secondary | ICD-10-CM | POA: Diagnosis not present

## 2015-02-07 DIAGNOSIS — Z808 Family history of malignant neoplasm of other organs or systems: Secondary | ICD-10-CM | POA: Diagnosis not present

## 2015-02-07 DIAGNOSIS — M79622 Pain in left upper arm: Secondary | ICD-10-CM | POA: Diagnosis not present

## 2015-02-07 DIAGNOSIS — R972 Elevated prostate specific antigen [PSA]: Secondary | ICD-10-CM | POA: Diagnosis not present

## 2015-02-07 DIAGNOSIS — Z792 Long term (current) use of antibiotics: Secondary | ICD-10-CM | POA: Diagnosis not present

## 2015-02-07 DIAGNOSIS — I10 Essential (primary) hypertension: Secondary | ICD-10-CM | POA: Diagnosis not present

## 2015-02-07 DIAGNOSIS — H409 Unspecified glaucoma: Secondary | ICD-10-CM | POA: Diagnosis not present

## 2015-02-07 HISTORY — DX: Cutaneous abscess of left axilla: L02.412

## 2015-02-08 ENCOUNTER — Other Ambulatory Visit: Payer: Self-pay

## 2015-02-08 DIAGNOSIS — T888XXA Other specified complications of surgical and medical care, not elsewhere classified, initial encounter: Secondary | ICD-10-CM | POA: Diagnosis not present

## 2015-02-08 DIAGNOSIS — E785 Hyperlipidemia, unspecified: Secondary | ICD-10-CM | POA: Diagnosis not present

## 2015-02-08 DIAGNOSIS — N183 Chronic kidney disease, stage 3 (moderate): Secondary | ICD-10-CM | POA: Diagnosis not present

## 2015-02-08 DIAGNOSIS — H409 Unspecified glaucoma: Secondary | ICD-10-CM | POA: Diagnosis not present

## 2015-02-08 DIAGNOSIS — I129 Hypertensive chronic kidney disease with stage 1 through stage 4 chronic kidney disease, or unspecified chronic kidney disease: Secondary | ICD-10-CM | POA: Diagnosis not present

## 2015-02-08 DIAGNOSIS — Z79899 Other long term (current) drug therapy: Secondary | ICD-10-CM | POA: Diagnosis not present

## 2015-02-08 DIAGNOSIS — Z7982 Long term (current) use of aspirin: Secondary | ICD-10-CM | POA: Diagnosis not present

## 2015-02-08 DIAGNOSIS — L7634 Postprocedural seroma of skin and subcutaneous tissue following other procedure: Secondary | ICD-10-CM | POA: Diagnosis not present

## 2015-02-08 DIAGNOSIS — C4362 Malignant melanoma of left upper limb, including shoulder: Secondary | ICD-10-CM | POA: Diagnosis not present

## 2015-02-08 DIAGNOSIS — Z87891 Personal history of nicotine dependence: Secondary | ICD-10-CM | POA: Diagnosis not present

## 2015-02-08 DIAGNOSIS — E1122 Type 2 diabetes mellitus with diabetic chronic kidney disease: Secondary | ICD-10-CM | POA: Diagnosis not present

## 2015-02-08 DIAGNOSIS — Z7984 Long term (current) use of oral hypoglycemic drugs: Secondary | ICD-10-CM | POA: Diagnosis not present

## 2015-02-08 DIAGNOSIS — R972 Elevated prostate specific antigen [PSA]: Secondary | ICD-10-CM | POA: Diagnosis not present

## 2015-02-08 NOTE — Telephone Encounter (Signed)
I received a refill request for Amlodipine. I only see this medication on the patient's historical med list. Ok to refill this medication?

## 2015-02-11 ENCOUNTER — Telehealth: Payer: Self-pay | Admitting: Hematology and Oncology

## 2015-02-11 NOTE — Progress Notes (Signed)
[facility] day:  11/15/2014  Chief Complaint: Charles Rodgers is an 81 y.o. male with anemia, thrombocytopenia, chronic renal insufficiency, and a monoclonal gammopathy who is seen for review of initial work-up and discussion regarding direction of therapy.  HPI: The patient was last seen in the medical oncology clinic on 09/11/2014 for new patient assessment.   He was noted to have a 2 year history of a normocytic anemia, thrombocytopenia and a normal white count. Serum protein electrophoresis on 08/15/2014 revealed a 0.3 g/dL monoclonal spike. He has chronic renal insufficiency (Cr 1.7-2.03) over the past 2 years without trend.  He denies any new medications or herbal products.  He had lost 12 pounds in the past year.  He noted a change in his diet.  Diet rarely included meat. Appetite was good.  He denied any melena or hematochezia. He had never had a colonoscopy.  Exam was unremarkable.  Labs on 09/11/2014 included CBC with a hematocrit 30.4, hemoglobin 9.7, MCV 95.1, platelets 80,000, white count 5300 with an ANC of 3800. Differential was unremarkable.  B12 was 517, folate 26 and TSH 2.062 (normal).  Reticulocyte count was low at 3.19%. Ferritin was 47 with a iron saturation of 20% and a TIBC of 265 (normal).  PT was 15.4 with an INR of 1.2 (normal).  PTT was 34. Immunoglobulin levels were normal with an IgG of 1165, IgA 307 and IgM 131. Serum protein electrophoresis revealed a 0.4 g/dL faint band in the gamma region suspicious for monoclonal immunoglobulin.  Kappa free light chains were 147.45, lambda free light chains 32.32, with a kappa lambda ratio of 4.56 (0.26-1.65). LDH was 173 (normal).  ANA was negative.  Guaiac cards were negative 2.  24-hour urine revealed kappa free light chains of 30.6, lambda free light chains 2.25 and a ratio 13.6 (2.04-10.37). 24 hour urine protein electrophoresis revealed no monoclonal protein.  Review of  peripheral smear on 08/27/2014 revealed no abnormal white blood cells, thrombocytopenia without clumping and some large platelets. There were elliptocytes, teardrop red blood cells, and schistocytes.  Bone survey on 10/03/2014 revealed subtle lucencies within the calvarium, left femoral head, and proximal left tibia which could reflect myelomatous involvement. There were degenerative changes of the spine and old posttraumatic changes of multiple right ribs laterally.  Symptomatically, the patient feels good. He has had no fevers or infections.  He is tired, but there is no change in his baseline. He states that a week ago he went to the dermatologist. Biopsies were taken from the left inner arm, left side of the face, and right hand.  He has a diagnosis of melanoma.  Past Medical History  Diagnosis Date  . Arthritis   . Diabetes mellitus type 2, controlled   . Glaucoma   . HTN (hypertension)   . HLD (hyperlipidemia)   . CKD (chronic kidney disease) stage 3, GFR 30-59 ml/min     [place]  . Urine incontinence   . BPH (benign prostatic hypertrophy)     with elevated PSA, s/p benign biopsy, followed yearly by [facility] (last seen 01/16/2013)  . Secondary hyperparathyroidism   . Hearing loss   . Melanoma 2014    nose and left hand  . Thyroid nodule 02/2013    right upper lobe - s/p biopsy - benign follicular nodule  . Subclavian artery stenosis, right 2014    found on carotid US, seen [facility], recommended recheck 1 yr at vascular lab with Dr. Delana Meyer  .  COPD (chronic obstructive pulmonary disease) 2016    by xray  . IgM monoclonal gammopathy of uncertain significance 08/27/2014    07/2014 suggest rpt 6 mo.  . Malignant melanoma of left upper arm 11/14/2014    s/p excision Dr Carmin Muskrat    Past Surgical History  Procedure Laterality Date  . Appendectomy  1938  . Arm surgery      Right after trauma  . US echocardiography  06/2007    EF 55%, mod dil LA, mild LVH,mild pulm HTN  . Carotid US   07/2007    WNL  . 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 Biopsy thyroid Right [id number]    benign follicular nodule    Family History  Problem Relation Age of Onset  . Cancer Mother     stomach  . Cancer Brother     colon  . Cancer Brother     colon  . Cancer Sister     hodgkin's lymphoma  . Cancer Brother     prostate  . Diabetes Brother   . Coronary artery disease Neg Hx   . Stroke Neg Hx     Social History:  reports that he quit smoking about 47 years ago. His smoking use included Cigarettes. He has a 7.5 pack-year smoking history. He has never used smokeless tobacco. He reports that he does not drink alcohol or use illicit drugs.  The patient is accompanied by his brother [phone]).  Allergies: No Known Allergies  Current Medications: Current Outpatient Prescriptions  Medication Sig Dispense Refill  . amLODipine (NORVASC) 10 MG tablet     . Blood Glucose Monitoring Suppl (ONE TOUCH ULTRA 2) W/DEVICE KIT     . brimonidine (ALPHAGAN) 0.2 % ophthalmic solution Place 1 drop into both eyes 2 (two) times daily.    Sarajane Marek Sodium 30-100 MG CAPS Take by mouth daily.    . dorzolamide-timolol (COSOPT) 22.3-6.8 MG/ML ophthalmic solution     . doxazosin (CARDURA) 8 MG tablet TAKE 1 TABLET BY MOUTH EVERY NIGHT AT BEDTIME 90 tablet 3  . fish oil-omega-3 fatty acids 1000 MG capsule Take 2 g by mouth daily.    . furosemide (LASIX) 20 MG tablet Take 20 mg by mouth daily.    Marland Kitchen latanoprost (XALATAN) 0.005 % ophthalmic solution     . lisinopril-hydrochlorothiazide (PRINZIDE,ZESTORETIC) 20-25 MG per tablet Take 1 tablet by mouth daily. 90 tablet 3  . Multiple Vitamins-Minerals (PRESERVISION AREDS) CAPS Take 2 capsules by mouth daily.    . ONE TOUCH ULTRA TEST test strip USE TO CHECK BLOOD SUGAR 3 TIMES DAILY 100 each 3  . ONETOUCH DELICA LANCETS MISC Use as directed    . potassium chloride (K-DUR) 10 MEQ tablet TAKE 1 TABLET  BY MOUTH DAILY AS NEEDED 30 tablet 3  . pravastatin (PRAVACHOL) 40 MG tablet TAKE 1 TABLET BY MOUTH EVERY NIGHT AT BEDTIME 90 tablet 3  . timolol (BETIMOL) 0.5 % ophthalmic solution Place 1 drop into both eyes 2 (two) times daily.    Marland Kitchen aspirin (ASPIRIN EC) 81 MG EC tablet Take 81 mg by mouth daily. Swallow whole.    . Calcium Carbonate-Vitamin D (CALTRATE 600+D PO) Take by mouth daily.     No current facility-administered medications for this visit.    Review of Systems:  GENERAL:  Feels good. Mild chronic fatigue.  No fevers or sweats .  Weight stable. PERFORMANCE STATUS (ECOG):  1 HEENT:  Hard  of hearing.  No visual changes, runny nose, sore throat, mouth sores or tenderness. Lungs: No shortness of breath or cough.  No hemoptysis. Cardiac:  No chest pain, palpitations, orthopnea, or PND. GI:  Good appetite.  No nausea, vomiting, diarrhea, constipation, melena or hematochezia. GU:  No urgency, frequency, dysuria, or hematuria. Musculoskeletal:  Shoulder pain with raking leaves.  No back pain.  No joint pain.  No muscle tenderness. Extremities:  No pain or swelling. Skin:  Recent skin biopsies.  No rashes or skin changes. Neuro:  No headache, numbness or weakness, balance or coordination issues. Endocrine:  No diabetes, thyroid issues, hot flashes or night sweats. Psych:  No mood changes, depression or anxiety. Pain:  No focal pain. Review of systems:  All other systems reviewed and found to be negative.   Physical Exam: Blood pressure 157/68, pulse 78, temperature 99.1 F (37.3 C), temperature source Tympanic, resp. rate 18, weight 171 lb 8.3 oz (77.8 kg). GENERAL:  Well developed, well nourished, sitting comfortably in the exam room in no acute distress. MENTAL STATUS:  Alert and oriented to person, place and time. HEAD:  Wearing a cap.  White hair.  Male pattern baldness.  Normocephalic, atraumatic, face symmetric, no Cushingoid features. EYES:  Glasses. hazel eyes.  Pupils equal  round and reactive to light and accomodation.  No conjunctivitis or scleral icterus. ENT:  Hearing aides.  Oropharynx clear without lesion.  Tongue normal. Mucous membranes moist.  RESPIRATORY:  Clear to auscultation without rales, wheezes or rhonchi. CARDIOVASCULAR:  Regular rate and rhythm without murmur, rub or gallop. ABDOMEN:  Ventral hernia.  Soft, non-tender, with active bowel sounds, and no hepatosplenomegaly.  No masses. BACK:  No CVA tenderness.  No tenderness on percussion of the back or rib cage. SKIN:  Changes due to sun exposure.  Recent skin biopsies.  No rashes, ulcers or lesions. EXTREMITIES: 1+ ankle edema.  No skin discoloration or tenderness.  No palpable cords. LYMPH NODES: No palpable cervical, supraclavicular, axillary or inguinal adenopathy  NEUROLOGICAL: Unremarkable. PSYCH:  Appropriate.   Office Visit on 11/15/2014  Component Date Value Ref Range Status  . WBC 11/15/2014 5.9  3.8 - 10.6 K/uL Final  . RBC 11/15/2014 3.14* 4.40 - 5.90 MIL/uL Final  . Hemoglobin 11/15/2014 9.6* 13.0 - 18.0 g/dL Final  . HCT 11/15/2014 29.7* 40.0 - 52.0 % Final  . MCV 11/15/2014 94.4  80.0 - 100.0 fL Final  . MCH 11/15/2014 30.4  26.0 - 34.0 pg Final  . MCHC 11/15/2014 32.2  32.0 - 36.0 g/dL Final  . RDW 11/15/2014 14.6* 11.5 - 14.5 % Final  . Platelets 11/15/2014 84* 150 - 440 K/uL Final  . Neutrophils Relative % 11/15/2014 72   Final  . Neutro Abs 11/15/2014 4.3  1.4 - 6.5 K/uL Final  . Lymphocytes Relative 11/15/2014 17   Final  . Lymphs Abs 11/15/2014 1.0  1.0 - 3.6 K/uL Final  . Monocytes Relative 11/15/2014 9   Final  . Monocytes Absolute 11/15/2014 0.5  0.2 - 1.0 K/uL Final  . Eosinophils Relative 11/15/2014 2   Final  . Eosinophils Absolute 11/15/2014 0.1  0 - 0.7 K/uL Final  . Basophils Relative 11/15/2014 0   Final  . Basophils Absolute 11/15/2014 0.0  0 - 0.1 K/uL Final    Assessment:  Charles Rodgers is an 80 y.o. male with a 2 year history of a normocytic  anemia, thrombocytopenia and a normal white count. Serum protein electrophoresis on 08/15/2014 revealed a  0.3 g/dL monoclonal spike. He has chronic renal insufficiency (Cr 1.7-2.03) over the past 2 years without trend.  He denies any new medications or herbal products.  Peripheral smear on 08/27/2014 revealed no abnormal white blood cells, thrombocytopenia without clumping and some large platelets. There were elliptocytes, teardrop red blood cells, and schistocytes. Teardrop RBCs and elliptocytes suggest possible extramedullary hematopoiesis (myelofibrosis).  Myelodysplastic syndrome with deletion 20q is a consideration.  Work-up on 09/11/2014 included CBC with a hematocrit 30.4, hemoglobin 9.7, MCV 95.1, platelets 80,000, white count 5300 with an Morton of 3800. Differential was unremarkable.  B12, folate, TSH, ANA, PT, PTT, LDH.  Reticulocyte count was low at 3.19%. Ferritin was 47 with a iron saturation of 20% and a TIBC of 265 (normal).  Immunoglobulin levels were normal. SPEP revealed a 0.4 g/dL faint band in the gamma region suspicious for monoclonal immunoglobulin.  Kappa free light chains were 147.45, lambda free light chains 32.32, with a kappa lambda ratio of 4.56 (0.26-1.65).    24-hour urine revealed kappa free light chains of 30.6, lambda free light chains 2.25 and a ratio 13.6 (2.04-10.37). 24 hour urine protein electrophoresis revealed no monoclonal protein.  Bone survey on 10/03/2014 revealed subtle lucencies within the calvarium, left femoral head, and proximal left tibia which could reflect myelomatous involvement.   He lost 12 pounds in the past year.  Weight has been stable over the past month.  He rarely eats meat.  He denies any melena or hematochezia. He has never had a colonoscopy.  Guaiac cards were negative 2 on 09/16/2014.  He was recently diagnosed with melanoma.    Plan: 1. Discuss work-up. Discuss peripheral smear suggesting possible myelofibrosis or myelodysplastic syndrome.  Unclear significance of elevated kappa free light chains. Discuss lucencies in bones possibly suggesting myeloma.  I discussed a bone marrow aspirate and biopsy. 2. Follow-up with dermatology Dr. Onalee Hua at [facility] [phone]; [phone]). 3. Discuss consideration of colonoscopy 4. Labs today: CBC with differential. 5. Patient to call back after surgery for melanoma to schedule bone marrow aspirate and biopsy.   Lequita Asal, MD  11/15/2014

## 2015-02-11 NOTE — Telephone Encounter (Signed)
Re:  Bone marrow  Spoke to the patient's brother ([phone]) regarding the patient return bone marrow aspirate and biopsy to define his underlying marrow disorder. He states that his brother has had complications with his melanoma surgery. He apparently has a drain in place at the present time following surgery.  He will follow-up as soon as possible once these other issues are resolved.  Lequita Asal, MD

## 2015-02-11 NOTE — Telephone Encounter (Signed)
Re:  Bone marrow aspirate and biopsy  Discussed with patient's brother ([phone]) follow-up of his underlying bone marrow disorder. Patient was to have followed up after his melanoma surgery. His brother states that he has had ongoing complications. He currently has a drain in place. He stated that he will follow-up as soon as these other issues are resolved.  Lequita Asal, MD

## 2015-02-13 ENCOUNTER — Encounter: Payer: Self-pay | Admitting: Family Medicine

## 2015-02-13 ENCOUNTER — Other Ambulatory Visit: Payer: Self-pay | Admitting: Family Medicine

## 2015-02-14 ENCOUNTER — Ambulatory Visit: Payer: Medicare Other | Admitting: Family Medicine

## 2015-02-14 DIAGNOSIS — Z09 Encounter for follow-up examination after completed treatment for conditions other than malignant neoplasm: Secondary | ICD-10-CM | POA: Diagnosis not present

## 2015-02-14 DIAGNOSIS — C4362 Malignant melanoma of left upper limb, including shoulder: Secondary | ICD-10-CM | POA: Diagnosis not present

## 2015-02-14 DIAGNOSIS — L02412 Cutaneous abscess of left axilla: Secondary | ICD-10-CM | POA: Diagnosis not present

## 2015-02-19 ENCOUNTER — Other Ambulatory Visit: Payer: Self-pay | Admitting: Family Medicine

## 2015-03-01 ENCOUNTER — Encounter: Payer: Self-pay | Admitting: *Deleted

## 2015-03-01 ENCOUNTER — Emergency Department: Payer: Medicare Other

## 2015-03-01 ENCOUNTER — Telehealth: Payer: Self-pay | Admitting: Family Medicine

## 2015-03-01 ENCOUNTER — Emergency Department
Admission: EM | Admit: 2015-03-01 | Discharge: 2015-03-01 | Disposition: A | Discharge status: Home / Self Care | Payer: Medicare Other | Attending: Emergency Medicine | Admitting: Emergency Medicine

## 2015-03-01 DIAGNOSIS — S0181XA Laceration without foreign body of other part of head, initial encounter: Secondary | ICD-10-CM | POA: Insufficient documentation

## 2015-03-01 DIAGNOSIS — S0990XA Unspecified injury of head, initial encounter: Secondary | ICD-10-CM | POA: Diagnosis not present

## 2015-03-01 DIAGNOSIS — E1140 Type 2 diabetes mellitus with diabetic neuropathy, unspecified: Secondary | ICD-10-CM | POA: Diagnosis not present

## 2015-03-01 DIAGNOSIS — W19XXXA Unspecified fall, initial encounter: Secondary | ICD-10-CM

## 2015-03-01 DIAGNOSIS — Z87891 Personal history of nicotine dependence: Secondary | ICD-10-CM | POA: Diagnosis not present

## 2015-03-01 DIAGNOSIS — I129 Hypertensive chronic kidney disease with stage 1 through stage 4 chronic kidney disease, or unspecified chronic kidney disease: Secondary | ICD-10-CM | POA: Diagnosis not present

## 2015-03-01 DIAGNOSIS — Y998 Other external cause status: Secondary | ICD-10-CM | POA: Insufficient documentation

## 2015-03-01 DIAGNOSIS — W1839XA Other fall on same level, initial encounter: Secondary | ICD-10-CM | POA: Diagnosis not present

## 2015-03-01 DIAGNOSIS — Y9289 Other specified places as the place of occurrence of the external cause: Secondary | ICD-10-CM | POA: Insufficient documentation

## 2015-03-01 DIAGNOSIS — R220 Localized swelling, mass and lump, head: Secondary | ICD-10-CM | POA: Diagnosis not present

## 2015-03-01 DIAGNOSIS — Z79899 Other long term (current) drug therapy: Secondary | ICD-10-CM | POA: Insufficient documentation

## 2015-03-01 DIAGNOSIS — Z7982 Long term (current) use of aspirin: Secondary | ICD-10-CM | POA: Insufficient documentation

## 2015-03-01 DIAGNOSIS — Y9389 Activity, other specified: Secondary | ICD-10-CM | POA: Diagnosis not present

## 2015-03-01 DIAGNOSIS — N183 Chronic kidney disease, stage 3 (moderate): Secondary | ICD-10-CM | POA: Diagnosis not present

## 2015-03-01 MED ORDER — ACETAMINOPHEN 325 MG PO TABS
ORAL_TABLET | ORAL | Status: AC
  Filled 2015-03-01: qty 2

## 2015-03-01 NOTE — Telephone Encounter (Signed)
Per chart review pt is at [facility] ED. 

## 2015-03-01 NOTE — Discharge Instructions (Signed)
Facial Laceration ° A facial laceration is a cut on the face. These injuries can be painful and cause bleeding. Lacerations usually heal quickly, but they need special care to reduce scarring. °DIAGNOSIS  °Your health care provider will take a medical history, ask for details about how the injury occurred, and examine the wound to determine how deep the cut is. °TREATMENT  °Some facial lacerations may not require closure. Others may not be able to be closed because of an increased risk of infection. The risk of infection and the chance for successful closure will depend on various factors, including the amount of time since the injury occurred. °The wound may be cleaned to help prevent infection. If closure is appropriate, pain medicines may be given if needed. Your health care provider will use stitches (sutures), wound glue (adhesive), or skin adhesive strips to repair the laceration. These tools bring the skin edges together to allow for faster healing and a better cosmetic outcome. If needed, you may also be given a tetanus shot. °HOME CARE INSTRUCTIONS °· Only take over-the-counter or prescription medicines as directed by your health care provider. °· Follow your health care provider's instructions for wound care. These instructions will vary depending on the technique used for closing the wound. °For Sutures: °· Keep the wound clean and dry.   °· If you were given a bandage (dressing), you should change it at least once a day. Also change the dressing if it becomes wet or dirty, or as directed by your health care provider.   °· Wash the wound with soap and water 2 times a day. Rinse the wound off with water to remove all soap. Pat the wound dry with a clean towel.   °· After cleaning, apply a thin layer of the antibiotic ointment recommended by your health care provider. This will help prevent infection and keep the dressing from sticking.   °· You may shower as usual after the first 24 hours. Do not soak the  wound in water until the sutures are removed.   °· Get your sutures removed as directed by your health care provider. With facial lacerations, sutures should usually be taken out after 4-5 days to avoid stitch marks.   °· Wait a few days after your sutures are removed before applying any makeup. °For Skin Adhesive Strips: °· Keep the wound clean and dry.   °· Do not get the skin adhesive strips wet. You may bathe carefully, using caution to keep the wound dry.   °· If the wound gets wet, pat it dry with a clean towel.   °· Skin adhesive strips will fall off on their own. You may trim the strips as the wound heals. Do not remove skin adhesive strips that are still stuck to the wound. They will fall off in time.   °For Wound Adhesive: °· You may briefly wet your wound in the shower or bath. Do not soak or scrub the wound. Do not swim. Avoid periods of heavy sweating until the skin adhesive has fallen off on its own. After showering or bathing, gently pat the wound dry with a clean towel.   °· Do not apply liquid medicine, cream medicine, ointment medicine, or makeup to your wound while the skin adhesive is in place. This may loosen the film before your wound is healed.   °· If a dressing is placed over the wound, be careful not to apply tape directly over the skin adhesive. This may cause the adhesive to be pulled off before the wound is healed.   °· Avoid   prolonged exposure to sunlight or tanning lamps while the skin adhesive is in place. °· The skin adhesive will usually remain in place for 5-10 days, then naturally fall off the skin. Do not pick at the adhesive film.   °After Healing: °Once the wound has healed, cover the wound with sunscreen during the day for 1 full year. This can help minimize scarring. Exposure to ultraviolet light in the first year will darken the scar. It can take 1-2 years for the scar to lose its redness and to heal completely.  °SEEK MEDICAL CARE IF: °· You have a fever. °SEEK IMMEDIATE  MEDICAL CARE IF: °· You have redness, pain, or swelling around the wound.   °· You see a yellowish-white fluid (pus) coming from the wound.   °  °This information is not intended to replace advice given to you by your health care provider. Make sure you discuss any questions you have with your health care provider. °  °Document Released: 04/23/2004 Document Revised: 04/06/2014 Document Reviewed: 10/27/2012 °Elsevier Interactive Patient Education ©2016 [employer] ° °

## 2015-03-01 NOTE — ED Notes (Signed)
Was standing on [address] , fell forward , did not have his cane,, hit his head has forehead lac, no loc

## 2015-03-01 NOTE — ED Provider Notes (Addendum)
[facility]patient Emergency Department Provider Note ____________________________________________  Time seen: Approximately 2:19 PM  I have reviewed the triage vital signs and the nursing notes.   HISTORY  Chief Complaint Fall   HPI Charles Rodgers is a 81 y.o. male who presents to the emergency department for evaluation of a laceration to the right forehead. He lost his balance when standing on his carport and fell forward because he didn't have his cane. He denies loss of consciousness or pain. He is not on any type of blood thinner/aspirin per day.   Past Medical History  Diagnosis Date  . Arthritis   . Diabetes mellitus type 2, controlled ([place])   . Glaucoma   . HTN (hypertension)   . HLD (hyperlipidemia)   . CKD (chronic kidney disease) stage 3, GFR 30-59 ml/min     [place]  . Urine incontinence   . BPH (benign prostatic hypertrophy)     with elevated PSA, s/p benign biopsy, followed yearly by [facility] (last seen 01/16/2013)  . Secondary hyperparathyroidism ([place])   . Hearing loss   . Melanoma ([place]) 2014    nose and left hand  . Thyroid nodule 02/2013    right upper lobe - s/p biopsy - benign follicular nodule  . Subclavian artery stenosis, right 2014    found on carotid US, seen [facility], recommended recheck 1 yr at vascular lab with Dr. Delana Meyer  . COPD (chronic obstructive pulmonary disease) ([place]) 2016    by xray  . IgM monoclonal gammopathy of uncertain significance 08/27/2014    07/2014 suggest rpt 6 mo.  . Malignant melanoma of left upper arm ([place]) 11/14/2014    s/p excision complicated by infected axillary seroma (Dr Carmin Muskrat)    Patient Active Problem List   Diagnosis Date Noted  . Malignant melanoma of left upper arm ([place]) 11/14/2014  . IgM monoclonal gammopathy of uncertain significance 08/27/2014  . Loss of weight 08/14/2014  . Foot pain, right 03/05/2014  . Advanced care planning/counseling discussion 02/09/2014  . Health maintenance  examination 02/09/2014  . Diabetes ([place]) 01/13/2013  . Abnormal prostate specific antigen 01/13/2013  . Right thyroid nodule   . Subclavian artery stenosis, right   . Medicare annual wellness visit, subsequent 05/18/2012  . Right carotid bruit 05/18/2012  . Thrombocytopenia ([place]) 05/18/2012  . Anemia in chronic kidney disease 05/18/2012  . Glaucoma   . Hearing loss   . BPH (benign prostatic hypertrophy)   . Benign prostatic hyperplasia with urinary obstruction 12/18/2011  . Cardiac murmur 09/10/2011  . Well controlled type 2 diabetes mellitus with peripheral neuropathy ([place])   . HTN (hypertension)   . HLD (hyperlipidemia)   . CKD (chronic kidney disease) stage 3, GFR 30-59 ml/min     Past Surgical History  Procedure Laterality Date  . Appendectomy  1938  . Arm surgery      Right after trauma  . US echocardiography  06/2007    EF 55%, mod dil LA, mild LVH,mild pulm HTN  . Carotid US  07/2007    WNL  . 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 Biopsy thyroid Right 82/4235    benign follicular nodule    Current Outpatient Rx  Name  Route  Sig  Dispense  Refill  . amLODipine (NORVASC) 10 MG tablet               . aspirin (ASPIRIN EC) 81 MG EC tablet  Oral   Take 81 mg by mouth daily. Swallow whole.         . Blood Glucose Monitoring Suppl (ONE TOUCH ULTRA 2) W/DEVICE KIT               . brimonidine (ALPHAGAN) 0.2 % ophthalmic solution   Both Eyes   Place 1 drop into both eyes 2 (two) times daily.         . Calcium Carbonate-Vitamin D (CALTRATE 600+D PO)   Oral   Take by mouth daily.         Sarajane Marek Sodium 30-100 MG CAPS   Oral   Take by mouth daily.         . dorzolamide-timolol (COSOPT) 22.3-6.8 MG/ML ophthalmic solution               . doxazosin (CARDURA) 8 MG tablet      TAKE ONE TABLET BY MOUTH AT BEDTIME   90 tablet   3   . fish oil-omega-3 fatty acids 1000 MG capsule    Oral   Take 2 g by mouth daily.         . furosemide (LASIX) 20 MG tablet   Oral   Take 20 mg by mouth daily.         Marland Kitchen latanoprost (XALATAN) 0.005 % ophthalmic solution               . lisinopril-hydrochlorothiazide (PRINZIDE,ZESTORETIC) 20-25 MG tablet      TAKE ONE TABLET BY MOUTH EVERY DAY   90 tablet   3   . Multiple Vitamins-Minerals (PRESERVISION AREDS) CAPS   Oral   Take 2 capsules by mouth daily.         . ONE TOUCH ULTRA TEST test strip      USE TO CHECK BLOOD SUGAR 3 TIMES DAILY   100 each   3   . ONETOUCH DELICA LANCETS MISC      Use as directed         . potassium chloride (K-DUR) 10 MEQ tablet      TAKE 1 TABLET BY MOUTH DAILY AS NEEDED   30 tablet   3   . pravastatin (PRAVACHOL) 40 MG tablet      TAKE ONE TABLET BY MOUTH AT BEDTIME   90 tablet   3   . timolol (BETIMOL) 0.5 % ophthalmic solution   Both Eyes   Place 1 drop into both eyes 2 (two) times daily.           Allergies Review of patient's allergies indicates no known allergies.  Family History  Problem Relation Age of Onset  . Cancer Mother     stomach  . Cancer Brother     colon  . Cancer Brother     colon  . Cancer Sister     hodgkin's lymphoma  . Cancer Brother     prostate  . Diabetes Brother   . Coronary artery disease Neg Hx   . Stroke Neg Hx     Social History Social History  Substance Use Topics  . Smoking status: Former Smoker -- 0.50 packs/day for 15 years    Types: Cigarettes    Quit date: 03/31/1967  . Smokeless tobacco: Never Used  . Alcohol Use: No    Review of Systems Constitutional: No fever/chills Eyes: No visual changes. ENT: No sore throat. Cardiovascular: Denies chest pain. Respiratory: Denies shortness of breath. Gastrointestinal: No abdominal pain.  No nausea, no vomiting.  No  diarrhea.  No constipation. Genitourinary: Negative for dysuria. Musculoskeletal: Negative for back pain. Negative for neck pain. Skin: Negative for  rash. Neurological: Negative for headaches, focal weakness or numbness.  10-point ROS otherwise negative.  ____________________________________________   PHYSICAL EXAM:  VITAL SIGNS: ED Triage Vitals  Enc Vitals Group     BP 03/01/15 1335 124/68 mmHg     Pulse Rate 03/01/15 1335 78     Resp 03/01/15 1335 20     Temp 03/01/15 1335 97.8 F (36.6 C)     Temp Source 03/01/15 1335 Oral     SpO2 03/01/15 1335 99 %     Weight 03/01/15 1335 172 lb (78.019 kg)     Height 03/01/15 1335 _0  (1.727 m)     Head Cir --      Peak Flow --      Pain Score --      Pain Loc --      Pain Edu? --      Excl. in [place]? --     Constitutional: Alert and oriented. Well appearing and in no acute distress. Eyes: Conjunctivae are normal. PERRL. EOMI. Head: Atraumatic. Laceration over the right eyebrow. Bleeding is well controlled. Nose: No congestion/rhinnorhea. Mouth/Throat: Mucous membranes are moist.  Oropharynx non-erythematous. Neck: No stridor.  Nexus criteria is negative Cardiovascular: Normal rate, regular rhythm. Grossly normal heart sounds.  Good peripheral circulation. Respiratory: Normal respiratory effort.  No retractions. Lungs CTAB. Gastrointestinal: Soft and nontender. No distention. No abdominal bruits. No CVA tenderness. Musculoskeletal: No lower extremity tenderness nor edema.  No joint effusions. Neurologic:  Normal speech and language. No gross focal neurologic deficits are appreciated. No gait instability. Skin:  Skin is warm, dry and intact. No rash noted. Psychiatric: Mood and affect are normal. Speech and behavior are normal.  ____________________________________________   LABS (all labs ordered are listed, but only abnormal results are displayed)  Labs Reviewed - No data to display ____________________________________________  EKG   ____________________________________________  RADIOLOGY  CT scan of the head without contrast negative for acute abnormality post  fall per radiology. ____________________________________________   PROCEDURES  Procedure(s) performed: Wound was cleaned with normal saline. Skin had already reapproximated and the bleeding was scant. Skin adhesive was used to seal the wound edges. 1.5cm in length  Critical Care performed: No  ____________________________________________   INITIAL IMPRESSION / ASSESSMENT AND PLAN / ED COURSE  Pertinent labs & imaging results that were available during my care of the patient were reviewed by me and considered in my medical decision making (see chart for details).  Patient was advised to see his primary care provider on Monday as already scheduled. He was advised to return to the emergency department for concerns that change or worsen over the weekend. He is given head injury instructions and they were also reviewed with his son. ____________________________________________   FINAL CLINICAL IMPRESSION(S) / ED DIAGNOSES  Final diagnoses:  Fall  Laceration of forehead without complication, initial encounter  Head injury due to trauma, initial encounter      Victorino Dike, FNP 03/01/15 1535  Orbie Pyo, MD 03/01/15 [place], FNP 03/16/15 [place], MD 03/18/15 [phone]

## 2015-03-01 NOTE — Telephone Encounter (Signed)
[facility] Patient Name: Charles Rodgers Gender: Male DOB: 19-Jun-1924 Age: 81 Y 22 M 6 D Return Phone Number: [phone] (Primary), [phone] (Secondary) Address[place] [address] Client [name] - Client Client Site [name] - Day Physician Charles Rodgers Contact Type Call Call Type Triage / [name] Name Charles Rodgers Relationship To Patient Sibling Appointment Disposition EMR Appointment Not Necessary Info pasted into Epic Yes Return Phone Number [phone] (Primary) Chief Complaint Face Injury Initial Comment Caller States brother fell and hit himself above his eyebrow PreDisposition Call Doctor Nurse Assessment Nurse: Charles Dawley, RN, Charles Rodgers Date/Time Charles Rodgers Time): 03/01/2015 12:47:34 PM Confirm and document reason for call. If symptomatic, describe symptoms. ---BLEEDING ON THE OUTSIDE OF THE EYEBROW. CUT ON THE EYEBROW. SMALL BLEEDING NOTED. HE IS ON BLOOD THINNERS. SPOKE WITH THE BROTHER. GOT TO SPEAK WITH THE PATIENT A LITTLE AND HE TOLD NURSE THAT HE WAS NOT BLEEDING. HE DOES HAVE A CUT ON THE EYEBROW AND HE IS BLEEDING. THERE IS A BANDAID ON IT. NOT SURE HOW BIG THE CUT IS ACTUALLY. THE PATIENT KEEPS SAYING THAT HE WILL NOT GO TO THE ED THAT HE DOES NOT WANT TO WAIT TOO LONG. INFORMED HIM THAT THIS IS AN EMERGENCY THAT HE HIT HIS HEAD AND THAT HE IS ON BLOOD THINNERS AND THAT HE NEEDS TO BE SEEN. HE DOES NOT LIKE THE WAIT IN THE ED SO HE JUST WANTS TO COME TO THE OFFICE. INFORMED HIM THAT NO CAPABILITY TO CHECK HIM IN THE OFFICE FOR THIS AND HE HAS TO [place] INTO THE ED. ONCE NURSE WAS ABLE TO SPEAK TO THE PATIENT AND TELL HIM THE IMPORTANCE OF THIS THAT HE MUST BE SEEN HE WAS WILLING TO GO TO THE ED. CONFIRMED THIS WITH THE BROTHER Charles AS WELL. Has the patient traveled out of  the country within the last 30 days? ---Not Applicable Does the patient have any new or worsening symptoms? ---Yes Will a triage be completed? ---Yes Related visit to physician within the last 2 weeks? ---No Does the PT have any chronic conditions? (i.e. diabetes, asthma, etc.) ---Unknown PLEASE NOTE: All timestamps contained within this report are represented as [place] Standard Time. CONFIDENTIALTY NOTICE: This fax transmission is intended only for the addressee. It contains information that is legally privileged, confidential or otherwise protected from use or disclosure. If you are not the intended recipient, you are strictly prohibited from reviewing, disclosing, copying using or disseminating any of this information or taking any action in reliance on or regarding this information. If you have received this fax in error, please notify us immediately by telephone so that we can arrange for its return to [place]. Phone: [phone], Toll-Free: [phone], Fax: [phone] Page: 2 of 2 Call Id: [id number] Nurse Assessment Is this a behavioral health or substance abuse call? ---No Guidelines Guideline Title Affirmed Question Affirmed Notes Nurse Date/Time Charles Rodgers Time) Head Injury Taking Coumadin (warfarin) or other strong blood thinner, or known bleeding disorder (e.g., thrombocytopenia) Charles Dawley, RN, Charles Rodgers 03/01/2015 12:48:05 PM Disp. Time Charles Rodgers Time) Disposition Final User 03/01/2015 12:48:56 PM Go to ED Now (or PCP triage) Yes Charles Dawley, RN, Charles Rodgers Caller Understands: Yes Disagree/Comply: Comply Care Advice Given Per Guideline GO TO ED NOW (OR PCP TRIAGE): CARE ADVICE given per Head Injury (Adult) guideline. After Care Instructions Given Call Event Type User Date / Time Description Referrals [facility] -  ED

## 2015-03-03 NOTE — Telephone Encounter (Signed)
Noted. Will see tomorrow. 

## 2015-03-04 ENCOUNTER — Ambulatory Visit (INDEPENDENT_AMBULATORY_CARE_PROVIDER_SITE_OTHER): Payer: Medicare Other | Admitting: Family Medicine

## 2015-03-04 ENCOUNTER — Encounter: Payer: Self-pay | Admitting: Family Medicine

## 2015-03-04 VITALS — BP 116/60 | HR 68 | Temp 98.5°F | Wt 178.2 lb

## 2015-03-04 DIAGNOSIS — N183 Chronic kidney disease, stage 3 unspecified: Secondary | ICD-10-CM

## 2015-03-04 DIAGNOSIS — E785 Hyperlipidemia, unspecified: Secondary | ICD-10-CM | POA: Diagnosis not present

## 2015-03-04 DIAGNOSIS — I10 Essential (primary) hypertension: Secondary | ICD-10-CM | POA: Diagnosis not present

## 2015-03-04 DIAGNOSIS — D696 Thrombocytopenia, unspecified: Secondary | ICD-10-CM

## 2015-03-04 DIAGNOSIS — I708 Atherosclerosis of other arteries: Secondary | ICD-10-CM

## 2015-03-04 DIAGNOSIS — W19XXXD Unspecified fall, subsequent encounter: Secondary | ICD-10-CM

## 2015-03-04 DIAGNOSIS — S01111D Laceration without foreign body of right eyelid and periocular area, subsequent encounter: Secondary | ICD-10-CM

## 2015-03-04 DIAGNOSIS — R296 Repeated falls: Secondary | ICD-10-CM | POA: Insufficient documentation

## 2015-03-04 DIAGNOSIS — D472 Monoclonal gammopathy: Secondary | ICD-10-CM

## 2015-03-04 DIAGNOSIS — I771 Stricture of artery: Secondary | ICD-10-CM

## 2015-03-04 DIAGNOSIS — E1142 Type 2 diabetes mellitus with diabetic polyneuropathy: Secondary | ICD-10-CM | POA: Diagnosis not present

## 2015-03-04 DIAGNOSIS — S01111A Laceration without foreign body of right eyelid and periocular area, initial encounter: Secondary | ICD-10-CM | POA: Insufficient documentation

## 2015-03-04 DIAGNOSIS — R600 Localized edema: Secondary | ICD-10-CM

## 2015-03-04 LAB — LDL CHOLESTEROL, DIRECT: LDL DIRECT: 50 mg/dL

## 2015-03-04 LAB — HEMOGLOBIN A1C: Hgb A1c MFr Bld: 6.3 % (ref 4.6–6.5)

## 2015-03-04 LAB — RENAL FUNCTION PANEL
Albumin: 3.5 g/dL (ref 3.5–5.2)
BUN: 44 mg/dL — AB (ref 6–23)
CO2: 23 mEq/L (ref 19–32)
CREATININE: 1.82 mg/dL — AB (ref 0.40–1.50)
Calcium: 8.7 mg/dL (ref 8.4–10.5)
Chloride: 108 mEq/L (ref 96–112)
GFR: 37.52 mL/min — AB (ref >60.00)
Glucose, Bld: 116 mg/dL — ABNORMAL HIGH (ref 70–99)
PHOSPHORUS: 3.7 mg/dL (ref 2.3–4.6)
Potassium: 3.7 mEq/L (ref 3.5–5.1)
SODIUM: 139 meq/L (ref 135–145)

## 2015-03-04 MED ORDER — AMLODIPINE BESYLATE 5 MG PO TABS: 5.0000 mg | ORAL_TABLET | Freq: Every day | ORAL | Status: DC

## 2015-03-04 NOTE — Assessment & Plan Note (Signed)
Continues to heal well

## 2015-03-04 NOTE — Assessment & Plan Note (Addendum)
Overdue for f/u (last ultrasound 2014). Requests f/u deferred - will reassess next year.

## 2015-03-04 NOTE — Progress Notes (Signed)
Pre visit review using our clinic review tool, if applicable. No additional management support is needed unless otherwise documented below in the visit note. 

## 2015-03-04 NOTE — Patient Instructions (Addendum)
Start using cane. Blood work today. Let's decrease amlodipine to 5mg  daily (1/2 tablet of current dose until you run out). When you pick up new prescription, you won't have to cut in half anymore.  Return for medicare wellness visit in 4-6 months

## 2015-03-04 NOTE — Assessment & Plan Note (Signed)
Continue pravastatin. Check d LDL

## 2015-03-04 NOTE — Assessment & Plan Note (Signed)
Followed by heme Dr Mike Gip.

## 2015-03-04 NOTE — Assessment & Plan Note (Addendum)
Unclear cause. ?amlodipine related - will decrease to 5mg  daily. Check Cr. denies any cardiac symptoms.

## 2015-03-04 NOTE — Assessment & Plan Note (Signed)
Requests we start following here. I have asked him to touch base with renal and if ok by them we can follow here. Check renal panel today. CKD seems to be very stable.

## 2015-03-04 NOTE — Assessment & Plan Note (Signed)
Chronic, stable. Diet controlled diabetes over last year. Will recheck A1c today.

## 2015-03-04 NOTE — Progress Notes (Signed)
BP 116/60 mmHg  Pulse 68  Temp(Src) 98.5 F (36.9 C) (Oral)  Wt 178 lb 4 oz (80.854 kg)   CC: 3 mo f/u visit  Subjective:    Patient ID: Charles Rodgers, male    DOB: Jun 20, 1924, 81 y.o.   MRN: [id number]  HPI: Charles Rodgers is a 81 y.o. male presenting on 03/04/2015 for Follow-up   Seen at ER on Friday after fall out of carport (was not using cane) where he suffered eyebrow laceration, closed at ER with skin glue. No head injury. Head CT nonacute (showed empty sella). Records reviewed. Denies residual headache. Overall feels well today. Has cane but doesn't use.   Recent malignant melanoma s/p excision (Dr Phillip Heal in [place]). Excision complicated by infected axillary seroma (Dr Freddi Che at [facility]).  Recent eval by Dr Mike Gip for IgM kappa monoclonal gammopathy. Continues to gain weight. Overall feeling well.   DM - regularly does check sugars: 130-140s fasting and <200 at night. Compliant with antihyperglycemic regimen which includes: diet controlled.  Denies low sugars or hypoglycemic symptoms.  Denies paresthesias. Last diabetic eye exam 09/2013 (DUE).  Pneumovax: 09/2011.  Prevnar: 01/2014. Lab Results  Component Value Date   HGBA1C 6.3 08/09/2014   Diabetic Foot Exam - Simple   Simple Foot Form  Diabetic Foot exam was performed with the following findings:  Yes 03/04/2015 12:48 PM  Visual Inspection  See comments:  Yes  Sensation Testing  See comments:  Yes  Pulse Check  See comments:  Yes  Comments  Diminished pulses bilaterally Diminished sensation to monofilament bilaterally Pedal edema present       HTN, CKD stage 3 ([place]), HLD, and glaucoma all stable. COPD dx by CXR - not on respiratory medication, denies respiratory complaints.   Subclavian artery stenosis, right Date: 2014 found on carotid US, seen [facility]VS, recommended recheck 1 yr at vascular lab with Dr. Delana Meyer. Requests we postpone referral back for now.   Relevant past medical, surgical, family  and social history reviewed and updated as indicated. Interim medical history since our last visit reviewed. Allergies and medications reviewed and updated. Current Outpatient Prescriptions on File Prior to Visit  Medication Sig  . aspirin (ASPIRIN EC) 81 MG EC tablet Take 81 mg by mouth daily. Swallow whole.  . Blood Glucose Monitoring Suppl (ONE TOUCH ULTRA 2) W/DEVICE KIT   . Calcium Carbonate-Vitamin D (CALTRATE 600+D PO) Take by mouth daily.  Sarajane Marek Sodium 30-100 MG CAPS Take by mouth daily.  Marland Kitchen doxazosin (CARDURA) 8 MG tablet TAKE ONE TABLET BY MOUTH AT BEDTIME  . fish oil-omega-3 fatty acids 1000 MG capsule Take 2 g by mouth daily.  . furosemide (LASIX) 20 MG tablet Take 20 mg by mouth daily.  Marland Kitchen lisinopril-hydrochlorothiazide (PRINZIDE,ZESTORETIC) 20-25 MG tablet TAKE ONE TABLET BY MOUTH EVERY DAY  . Multiple Vitamins-Minerals (PRESERVISION AREDS) CAPS Take 2 capsules by mouth daily.  . ONE TOUCH ULTRA TEST test strip USE TO CHECK BLOOD SUGAR 3 TIMES DAILY  . ONETOUCH DELICA LANCETS MISC Use as directed  . potassium chloride (K-DUR) 10 MEQ tablet TAKE 1 TABLET BY MOUTH DAILY AS NEEDED  . pravastatin (PRAVACHOL) 40 MG tablet TAKE ONE TABLET BY MOUTH AT BEDTIME  . brimonidine (ALPHAGAN) 0.2 % ophthalmic solution Place 1 drop into both eyes 2 (two) times daily.  . dorzolamide-timolol (COSOPT) 22.3-6.8 MG/ML ophthalmic solution   . latanoprost (XALATAN) 0.005 % ophthalmic solution   . timolol (BETIMOL) 0.5 % ophthalmic solution Place 1 drop into  both eyes 2 (two) times daily.   No current facility-administered medications on file prior to visit.    Review of Systems Per HPI unless specifically indicated in ROS section     Objective:    BP 116/60 mmHg  Pulse 68  Temp(Src) 98.5 F (36.9 C) (Oral)  Wt 178 lb 4 oz (80.854 kg)  Wt Readings from Last 3 Encounters:  03/04/15 178 lb 4 oz (80.854 kg)  03/01/15 172 lb (78.019 kg)  11/15/14 171 lb 8.3 oz (77.8 kg)    Body mass index is 27.11 kg/(m^2).  Physical Exam  Constitutional: He appears well-developed and well-nourished. No distress.  HENT:  Mouth/Throat: Oropharynx is clear and moist. No oropharyngeal exudate.  Healing laceration L superior to medial eyebrow with edges well approximated Ecchymosis below L eye  HOH  Cardiovascular: Normal rate, normal heart sounds and intact distal pulses.  An irregular rhythm present.  No murmur heard. Pulmonary/Chest: Effort normal and breath sounds normal. No respiratory distress. He has no wheezes. He has no rales.  Musculoskeletal: He exhibits edema (2+ pitting bilatera up to below knee).  Skin: Skin is warm and dry. No rash noted.  Psychiatric: He has a normal mood and affect.  Nursing note and vitals reviewed.  Results for orders placed or performed in visit on 12/26/14  Iron and TIBC  Result Value Ref Range   Total Iron Binding Capacity 246    Iron 39    Iron Saturation 16   Hemoglobin A1c  Result Value Ref Range   A1c 6.4   Ferritin  Result Value Ref Range   Ferritin 60   CBC  Result Value Ref Range   WBC 5.4    HGB 9.3 g/dL   platelet count 97   PTH, intact  Result Value Ref Range   PTH 55   Vit D  25 hydroxy (rtn osteoporosis monitoring)  Result Value Ref Range   Vit D, 25-Hydroxy 27.6   Comprehensive metabolic panel  Result Value Ref Range   Glucose 108    Creat 1.61    Potassium 3.9 mmol/L      Assessment & Plan:   Problem List Items Addressed This Visit    Well controlled type 2 diabetes mellitus with peripheral neuropathy ([place]) - Primary    Chronic, stable. Diet controlled diabetes over last year. Will recheck A1c today.       Relevant Orders   Hemoglobin A1c   Thrombocytopenia (HCC)    Seeing Dr Mike Gip.      Subclavian artery stenosis, right    Overdue for f/u (last ultrasound 2014). Requests f/u deferred - will reassess next year.      Relevant Medications   amLODipine (NORVASC) 5 MG tablet   Pedal edema      Unclear cause. ?amlodipine related - will decrease to $RemoveBef'5mg'gvIDDnNhIc$  daily. Check Cr. denies any cardiac symptoms.      Laceration of eyebrow, right    Continues to heal well      IgM monoclonal gammopathy of uncertain significance    Followed by heme Dr Mike Gip.      HTN (hypertension)    Chronic, stable. Marked pedal edema - decrease amlodipine to $RemoveBefor'5mg'aMhEUfkbWlEb$  daily.       Relevant Medications   amLODipine (NORVASC) 5 MG tablet   HLD (hyperlipidemia)    Continue pravastatin. Check d LDL      Relevant Medications   amLODipine (NORVASC) 5 MG tablet   Other Relevant Orders   LDL Cholesterol, Direct  Fall    Encouraged regular use of cane.      CKD (chronic kidney disease) stage 3, GFR 30-59 ml/min    Requests we start following here. I have asked him to touch base with renal and if ok by them we can follow here. Check renal panel today. CKD seems to be very stable.      Relevant Orders   Renal function panel       Follow up plan: Return in about 4 months (around 07/03/2015), or as needed, for medicare wellness visit.

## 2015-03-04 NOTE — Assessment & Plan Note (Signed)
Encouraged regular use of cane.

## 2015-03-04 NOTE — Assessment & Plan Note (Signed)
Seeing Dr. Corcoran 

## 2015-03-04 NOTE — Assessment & Plan Note (Signed)
Chronic, stable. Marked pedal edema - decrease amlodipine to 5mg  daily.

## 2015-03-05 ENCOUNTER — Encounter: Payer: Self-pay | Admitting: *Deleted

## 2015-03-20 DIAGNOSIS — L570 Actinic keratosis: Secondary | ICD-10-CM | POA: Diagnosis not present

## 2015-03-27 DIAGNOSIS — C4362 Malignant melanoma of left upper limb, including shoulder: Secondary | ICD-10-CM | POA: Diagnosis not present

## 2015-04-17 DIAGNOSIS — H401131 Primary open-angle glaucoma, bilateral, mild stage: Secondary | ICD-10-CM | POA: Diagnosis not present

## 2015-06-11 DIAGNOSIS — C4362 Malignant melanoma of left upper limb, including shoulder: Secondary | ICD-10-CM | POA: Diagnosis not present

## 2015-07-22 ENCOUNTER — Other Ambulatory Visit: Payer: Self-pay | Admitting: Family Medicine

## 2015-08-26 ENCOUNTER — Other Ambulatory Visit: Payer: Self-pay | Admitting: Family Medicine

## 2015-08-26 DIAGNOSIS — E041 Nontoxic single thyroid nodule: Secondary | ICD-10-CM

## 2015-08-26 DIAGNOSIS — E785 Hyperlipidemia, unspecified: Secondary | ICD-10-CM

## 2015-08-26 DIAGNOSIS — N183 Chronic kidney disease, stage 3 unspecified: Secondary | ICD-10-CM

## 2015-08-26 DIAGNOSIS — I10 Essential (primary) hypertension: Secondary | ICD-10-CM

## 2015-08-26 DIAGNOSIS — D472 Monoclonal gammopathy: Secondary | ICD-10-CM

## 2015-08-26 DIAGNOSIS — E1142 Type 2 diabetes mellitus with diabetic polyneuropathy: Secondary | ICD-10-CM

## 2015-08-27 ENCOUNTER — Other Ambulatory Visit (INDEPENDENT_AMBULATORY_CARE_PROVIDER_SITE_OTHER): Payer: Medicare Other

## 2015-08-27 DIAGNOSIS — N183 Chronic kidney disease, stage 3 unspecified: Secondary | ICD-10-CM

## 2015-08-27 DIAGNOSIS — D472 Monoclonal gammopathy: Secondary | ICD-10-CM | POA: Diagnosis not present

## 2015-08-27 DIAGNOSIS — E785 Hyperlipidemia, unspecified: Secondary | ICD-10-CM | POA: Diagnosis not present

## 2015-08-27 DIAGNOSIS — E041 Nontoxic single thyroid nodule: Secondary | ICD-10-CM | POA: Diagnosis not present

## 2015-08-27 DIAGNOSIS — E1142 Type 2 diabetes mellitus with diabetic polyneuropathy: Secondary | ICD-10-CM | POA: Diagnosis not present

## 2015-08-27 LAB — RENAL FUNCTION PANEL
ALBUMIN: 3.6 g/dL (ref 3.5–5.2)
BUN: 60 mg/dL — AB (ref 6–23)
CHLORIDE: 110 meq/L (ref 96–112)
CO2: 22 meq/L (ref 19–32)
CREATININE: 2.06 mg/dL — AB (ref 0.40–1.50)
Calcium: 8.9 mg/dL (ref 8.4–10.5)
GFR: 32.49 mL/min — ABNORMAL LOW (ref >60.00)
Glucose, Bld: 132 mg/dL — ABNORMAL HIGH (ref 70–99)
Phosphorus: 4.1 mg/dL (ref 2.3–4.6)
Potassium: 4.1 mEq/L (ref 3.5–5.1)
Sodium: 139 mEq/L (ref 135–145)

## 2015-08-27 LAB — CBC WITH DIFFERENTIAL/PLATELET
BASOS PCT: 0.2 % (ref 0.0–3.0)
Basophils Absolute: 0 10*3/uL (ref 0.0–0.1)
EOS ABS: 0.1 10*3/uL (ref 0.0–0.7)
EOS PCT: 1.5 % (ref 0.0–5.0)
HEMATOCRIT: 28 % — AB (ref 39.0–52.0)
HEMOGLOBIN: 9.2 g/dL — AB (ref 13.0–17.0)
LYMPHS PCT: 15.8 % (ref 12.0–46.0)
Lymphs Abs: 0.9 10*3/uL (ref 0.7–4.0)
MCHC: 32.8 g/dL (ref 30.0–36.0)
MCV: 88.8 fl (ref 78.0–100.0)
MONO ABS: 0.5 10*3/uL (ref 0.1–1.0)
Monocytes Relative: 8.5 % (ref 3.0–12.0)
NEUTROS ABS: 4.4 10*3/uL (ref 1.4–7.7)
Neutrophils Relative %: 74 % (ref 43.0–77.0)
PLATELETS: 75 10*3/uL — AB (ref 150.0–400.0)
RBC: 3.15 Mil/uL — ABNORMAL LOW (ref 4.22–5.81)
RDW: 17.8 % — AB (ref 11.5–15.5)
WBC: 5.9 10*3/uL (ref 4.0–10.5)

## 2015-08-27 LAB — LIPID PANEL
CHOLESTEROL: 100 mg/dL (ref 0–200)
HDL: 31 mg/dL — ABNORMAL LOW (ref >39.00)
LDL CALC: 55 mg/dL (ref 0–99)
NonHDL: 69.33
TRIGLYCERIDES: 74 mg/dL (ref 0.0–149.0)
Total CHOL/HDL Ratio: 3
VLDL: 14.8 mg/dL (ref 0.0–40.0)

## 2015-08-27 LAB — HEMOGLOBIN A1C: Hgb A1c MFr Bld: 6.7 % — ABNORMAL HIGH (ref 4.6–6.5)

## 2015-08-27 LAB — TSH: TSH: 1.35 u[IU]/mL (ref 0.35–4.50)

## 2015-09-02 ENCOUNTER — Encounter: Payer: Medicare Other | Admitting: Family Medicine

## 2015-09-04 ENCOUNTER — Other Ambulatory Visit: Payer: Self-pay | Admitting: Family Medicine

## 2015-09-09 ENCOUNTER — Encounter: Payer: Self-pay | Admitting: Family Medicine

## 2015-09-09 ENCOUNTER — Ambulatory Visit (INDEPENDENT_AMBULATORY_CARE_PROVIDER_SITE_OTHER): Payer: Medicare Other | Admitting: Family Medicine

## 2015-09-09 VITALS — BP 146/68 | HR 56 | Temp 98.0°F | Ht 66.5 in | Wt 166.5 lb

## 2015-09-09 DIAGNOSIS — N183 Chronic kidney disease, stage 3 unspecified: Secondary | ICD-10-CM

## 2015-09-09 DIAGNOSIS — Z7189 Other specified counseling: Secondary | ICD-10-CM

## 2015-09-09 DIAGNOSIS — D631 Anemia in chronic kidney disease: Secondary | ICD-10-CM

## 2015-09-09 DIAGNOSIS — E785 Hyperlipidemia, unspecified: Secondary | ICD-10-CM

## 2015-09-09 DIAGNOSIS — I10 Essential (primary) hypertension: Secondary | ICD-10-CM

## 2015-09-09 DIAGNOSIS — I771 Stricture of artery: Secondary | ICD-10-CM

## 2015-09-09 DIAGNOSIS — E1142 Type 2 diabetes mellitus with diabetic polyneuropathy: Secondary | ICD-10-CM

## 2015-09-09 DIAGNOSIS — D472 Monoclonal gammopathy: Secondary | ICD-10-CM

## 2015-09-09 DIAGNOSIS — Z0000 Encounter for general adult medical examination without abnormal findings: Secondary | ICD-10-CM

## 2015-09-09 DIAGNOSIS — H409 Unspecified glaucoma: Secondary | ICD-10-CM

## 2015-09-09 DIAGNOSIS — N189 Chronic kidney disease, unspecified: Secondary | ICD-10-CM

## 2015-09-09 DIAGNOSIS — D696 Thrombocytopenia, unspecified: Secondary | ICD-10-CM

## 2015-09-09 DIAGNOSIS — Z9181 History of falling: Secondary | ICD-10-CM

## 2015-09-09 DIAGNOSIS — H9193 Unspecified hearing loss, bilateral: Secondary | ICD-10-CM

## 2015-09-09 DIAGNOSIS — C4362 Malignant melanoma of left upper limb, including shoulder: Secondary | ICD-10-CM

## 2015-09-09 NOTE — Assessment & Plan Note (Signed)
Marked despite L hearing aid

## 2015-09-09 NOTE — Assessment & Plan Note (Addendum)
Chronic. Cr most recently bumped to 2s but overall stable. Pt has asked me to follow his stable CKD - states this was ok with renal.  Latest renal note reviewed 11/2014.

## 2015-09-09 NOTE — Patient Instructions (Addendum)
Your blood counts remain abnormal - concern for blood disease like myelodysplastic syndrome or possibly blood cancer (multiple myeloma). Next step in evaluation would be return to Dr Mike Gip for bone marrow biopsy. Call her office at [phone]  If you'd like to schedule this.  We will continue to watch your kidney function. Think about repeating ultrasound of neck arteries to monitor blocked artery in neck (subclavian) Use sunscreen when out in the sun. Talk with your children about setting up an advanced directive. Packet provided today.  Return in 4-6 months for recheck sugars and kidneys.

## 2015-09-09 NOTE — Assessment & Plan Note (Signed)
Chronic, stable. Adequate control today.

## 2015-09-09 NOTE — Assessment & Plan Note (Signed)
Preventative protocols reviewed and updated unless pt declined. Discussed healthy diet and lifestyle.  

## 2015-09-09 NOTE — Assessment & Plan Note (Signed)
Continue f/u with ophtho

## 2015-09-09 NOTE — Progress Notes (Signed)
BP 146/68 mmHg  Pulse 56  Temp(Src) 98 F (36.7 C) (Oral)  Ht 5' 6.5" (1.689 m)  Wt 166 lb 8 oz (75.524 kg)  BMI 26.47 kg/m2   CC: medicare wellness visit  Subjective:    Patient ID: Charles Rodgers, male    DOB: 09-02-1924, 82 y.o.   MRN: [id number]  HPI: Charles Rodgers is a 82 y.o. male presenting on 09/09/2015 for Annual Exam   Forgot AMW form - will bring back later this week.   DM - checks sugars regularly, fasting 127 this morning.  CKD stage 4 ([place]) - pt requested we start monitoring. He states this was ok by Dr Juleen China.   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 Plan was to call back after melanoma surgery to schedule bone marrow biopsy - never f/u.   Recent malignant melanoma s/p excision (Dr Phillip Heal in [place]). Excision complicated by infected axillary seroma (Dr Freddi Che at [facility]).   Subclavian artery stenosis, right Date: 2014 found on carotid US, seen [facility], recommended recheck 1 yr at vascular lab with Dr. Delana Meyer. Requests we postpone referral back for now.   Hearing screen - deferred as uses hearing aides. Trouble understanding wife. Vision screen - with eye doctor 03/2015. Known glaucoma. Known R visual loss.  1 fall in last year with injury - suffered eyebrow laceration. Uses cane regularly. Some near falls.  Denies depression, anhedonia.  Preventative: Colon cancer screening - aged out. Denies bm changes or blood in stool. Prostate cancer screening - discussed, will stop. Stopped seeing urologist (Dr Bernardo Heater). Prior followed for BPH. consider checking PSA next lab draw. Flu shot yearly Td 12/2011 Pneumovax 09/2011, prevnar 2015 Shingles shot - declines.  Advanced directives - has not set up yet. Would like wife to make medical decisions. Declines prolonged life support. Wouldn't want to be kept alive if <50% chance would survive. Packet provided  today - did not complete last year. Seat belt use discussed Sunscreen use discussed  Caffeine: rare  Lives with wife, 1 dog (grown children)  Occupation: retired, worked Tourist information centre manager at [employer]  Edu: 5th grade  Activity: works Haematologist  Diet: some water, seldom fruits/vegetables.  Relevant past medical, surgical, family and social history reviewed and updated as indicated. Interim medical history since our last visit reviewed. Allergies and medications reviewed and updated. Current Outpatient Prescriptions on File Prior to Visit  Medication Sig  . amLODipine (NORVASC) 5 MG tablet TAKE 1 TABLET BY MOUTH ONCE A DAY  . aspirin (ASPIRIN EC) 81 MG EC tablet Take 81 mg by mouth daily. Swallow whole.  . Blood Glucose Monitoring Suppl (ONE TOUCH ULTRA 2) W/DEVICE KIT   . brimonidine (ALPHAGAN) 0.2 % ophthalmic solution Place 1 drop into both eyes 2 (two) times daily.  . Calcium Carbonate-Vitamin D (CALTRATE 600+D PO) Take by mouth daily.  Sarajane Marek Sodium 30-100 MG CAPS Take by mouth daily.  . dorzolamide-timolol (COSOPT) 22.3-6.8 MG/ML ophthalmic solution   . doxazosin (CARDURA) 8 MG tablet TAKE ONE TABLET BY MOUTH AT BEDTIME  . fish oil-omega-3 fatty acids 1000 MG capsule Take 2 g by mouth daily.  . furosemide (LASIX) 20 MG tablet Take 20 mg by mouth daily.  Marland Kitchen latanoprost (XALATAN) 0.005 % ophthalmic solution   . lisinopril-hydrochlorothiazide (PRINZIDE,ZESTORETIC) 20-25 MG tablet TAKE ONE TABLET BY MOUTH EVERY DAY  . Multiple Vitamins-Minerals (PRESERVISION AREDS) CAPS Take 2 capsules by mouth daily.  Marland Kitchen ONE  TOUCH ULTRA TEST test strip USE TO CHECK BLOOD SUGAR 3 TIMES DAILY  . ONETOUCH DELICA LANCETS MISC Use as directed  . potassium chloride (K-DUR) 10 MEQ tablet TAKE 1 TABLET BY MOUTH DAILY AS NEEDED  . pravastatin (PRAVACHOL) 40 MG tablet TAKE ONE TABLET BY MOUTH AT BEDTIME  . timolol (BETIMOL) 0.5 % ophthalmic solution Place 1 drop into both eyes 2 (two) times  daily.   No current facility-administered medications on file prior to visit.    Review of Systems  Constitutional: Negative for fever, chills, activity change, appetite change, fatigue and unexpected weight change.  HENT: Negative for hearing loss.   Eyes: Negative for visual disturbance.  Respiratory: Negative for cough, chest tightness, shortness of breath and wheezing.   Cardiovascular: Negative for chest pain, palpitations and leg swelling.  Gastrointestinal: Negative for nausea, vomiting, abdominal pain, diarrhea, constipation, blood in stool and abdominal distention.  Genitourinary: Negative for hematuria and difficulty urinating.  Musculoskeletal: Negative for myalgias, arthralgias and neck pain.  Skin: Negative for rash.  Neurological: Negative for dizziness, seizures, syncope and headaches.  Hematological: Negative for adenopathy. Does not bruise/bleed easily.  Psychiatric/Behavioral: Negative for dysphoric mood. The patient is not nervous/anxious.    Per HPI unless specifically indicated in ROS section     Objective:    BP 146/68 mmHg  Pulse 56  Temp(Src) 98 F (36.7 C) (Oral)  Ht 5' 6.5" (1.689 m)  Wt 166 lb 8 oz (75.524 kg)  BMI 26.47 kg/m2  Wt Readings from Last 3 Encounters:  09/09/15 166 lb 8 oz (75.524 kg)  03/04/15 178 lb 4 oz (80.854 kg)  03/01/15 172 lb (78.019 kg)    Physical Exam  Constitutional: He is oriented to person, place, and time. He appears well-developed and well-nourished. No distress.  HENT:  Head: Normocephalic and atraumatic.  Right Ear: Decreased hearing is noted.  Left Ear: Decreased hearing is noted.  Nose: Nose normal.  Mouth/Throat: Uvula is midline, oropharynx is clear and moist and mucous membranes are normal. No oropharyngeal exudate, posterior oropharyngeal edema or posterior oropharyngeal erythema.  L hearing aid in place, but remains markedly hard of hearing  Eyes: Conjunctivae and EOM are normal. Pupils are equal, round, and  reactive to light. No scleral icterus.  Neck: Normal range of motion. Neck supple. Carotid bruit is present (bilat faint). No thyromegaly present.  Cardiovascular: Normal rate, normal heart sounds and intact distal pulses.  An irregular rhythm present.  No murmur heard. Pulses:      Radial pulses are 2+ on the right side, and 2+ on the left side.  Pulmonary/Chest: Effort normal and breath sounds normal. No respiratory distress. He has no wheezes. He has no rales.  Abdominal: Soft. Bowel sounds are normal. He exhibits no distension and no mass. There is no tenderness. There is no rebound and no guarding.  Musculoskeletal: Normal range of motion. He exhibits no edema.  Lymphadenopathy:    He has no cervical adenopathy.  Neurological: He is alert and oriented to person, place, and time.  CN grossly intact, station and gait intact Recall 3/3 Calculation 5/5 serial 3s  Skin: Skin is warm and dry. No rash noted.  Psychiatric: He has a normal mood and affect. His behavior is normal. Judgment and thought content normal.  Nursing note and vitals reviewed.  Results for orders placed or performed in visit on 08/27/15  Lipid panel  Result Value Ref Range   Cholesterol 100 0 - 200 mg/dL   Triglycerides 74.0  0.0 - 149.0 mg/dL   HDL 31.00 (L) >39.00 mg/dL   VLDL 14.8 0.0 - 40.0 mg/dL   LDL Cholesterol 55 0 - 99 mg/dL   Total CHOL/HDL Ratio 3    NonHDL 69.33   CBC with Differential/Platelet  Result Value Ref Range   WBC 5.9 4.0 - 10.5 K/uL   RBC 3.15 (L) 4.22 - 5.81 Mil/uL   Hemoglobin 9.2 (L) 13.0 - 17.0 g/dL   HCT 28.0 (L) 39.0 - 52.0 %   MCV 88.8 78.0 - 100.0 fl   MCHC 32.8 30.0 - 36.0 g/dL   RDW 17.8 (H) 11.5 - 15.5 %   Platelets 75.0 (L) 150.0 - 400.0 K/uL   Neutrophils Relative % 74.0 43.0 - 77.0 %   Lymphocytes Relative 15.8 12.0 - 46.0 %   Monocytes Relative 8.5 3.0 - 12.0 %   Eosinophils Relative 1.5 0.0 - 5.0 %   Basophils Relative 0.2 0.0 - 3.0 %   Neutro Abs 4.4 1.4 - 7.7 K/uL    Lymphs Abs 0.9 0.7 - 4.0 K/uL   Monocytes Absolute 0.5 0.1 - 1.0 K/uL   Eosinophils Absolute 0.1 0.0 - 0.7 K/uL   Basophils Absolute 0.0 0.0 - 0.1 K/uL  Renal function panel  Result Value Ref Range   Sodium 139 135 - 145 mEq/L   Potassium 4.1 3.5 - 5.1 mEq/L   Chloride 110 96 - 112 mEq/L   CO2 22 19 - 32 mEq/L   Calcium 8.9 8.4 - 10.5 mg/dL   Albumin 3.6 3.5 - 5.2 g/dL   BUN 60 (H) 6 - 23 mg/dL   Creatinine, Ser 2.06 (H) 0.40 - 1.50 mg/dL   Glucose, Bld 132 (H) 70 - 99 mg/dL   Phosphorus 4.1 2.3 - 4.6 mg/dL   GFR 32.49 (L) >60.00 mL/min  TSH  Result Value Ref Range   TSH 1.35 0.35 - 4.50 uIU/mL  Hemoglobin A1c  Result Value Ref Range   Hgb A1c MFr Bld 6.7 (H) 4.6 - 6.5 %      Assessment & Plan:   Problem List Items Addressed This Visit    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 Continue diet controlled management.      HTN (hypertension)    Chronic, stable. Adequate control today.      HLD (hyperlipidemia)    Chronic, stable. Continue pravastatin. HDL low otherwise well controlled.      CKD (chronic kidney disease) stage 3, GFR 30-59 ml/min    Chronic. Cr most recently bumped to 2s but overall stable. Pt has asked me to follow his stable CKD - states this was ok with renal.  Latest renal note reviewed 11/2014.      Hearing loss    Marked despite L hearing aid      Glaucoma    Continue f/u with ophtho.      Medicare annual wellness visit, subsequent - Primary    I have personally reviewed the Medicare Annual Wellness questionnaire and have noted 1. The patient's medical and social history 2. Their use of alcohol, tobacco or illicit drugs 3. Their current medications and supplements 4. The patient's functional ability including ADL's, fall risks, home safety risks and hearing or visual impairment. Cognitive function has been assessed and addressed as indicated.  5. Diet and physical activity 6. Evidence  for depression or mood disorders The patients weight, height, BMI have been recorded in the chart. I have made referrals, counseling  and provided education to the patient based on review of the above and I have provided the pt with a written personalized care plan for preventive services. Provider list updated.. See scanned questionairre as needed for further documentation. Reviewed preventative protocols and updated unless pt declined.       Thrombocytopenia (HCC)    Thrombocytopenia + anemia, concern for MDS vs MM. Pt did not f/u with Dr Mike Gip - states was unaware. See below.       Anemia in chronic kidney disease    hgb remains 9s. Thought multifactorial. See above.      Subclavian artery stenosis, right    Declines re-imaging at this time - last done 2014.       Advanced care planning/counseling discussion    Advanced directives - has not set up yet. Would like wife to make medical decisions. Declines prolonged life support. Wouldn't want to be kept alive if <50% chance would survive. Packet provided today - did not complete last year.      Health maintenance examination    Preventative protocols reviewed and updated unless pt declined. Discussed healthy diet and lifestyle.       IgM monoclonal gammopathy of uncertain significance    Discussed recent labs and recent heme eval with concerns over a blood disease - MDS vs MM. Discussed next step of bone marrow biopsy if desired and encouraged f/u with Dr Mike Gip, # provided today. Pt will consider this. Offered to speak with family about this, he declines.      Malignant melanoma of left upper arm ([place])    Continued close f/u with derm.      At risk for falls       Follow up plan: Return in about 4 months (around 01/09/2016), or as needed, for follow up visit.  [name], MD

## 2015-09-09 NOTE — Assessment & Plan Note (Signed)
Discussed recent labs and recent heme eval with concerns over a blood disease - MDS vs MM. Discussed next step of bone marrow biopsy if desired and encouraged f/u with Dr Mike Gip, # provided today. Pt will consider this. Offered to speak with family about this, he declines.

## 2015-09-09 NOTE — Assessment & Plan Note (Signed)
hgb remains 9s. Thought multifactorial. See above.

## 2015-09-09 NOTE — Assessment & Plan Note (Signed)

## 2015-09-09 NOTE — Assessment & Plan Note (Signed)
Continued close f/u with derm.

## 2015-09-09 NOTE — Progress Notes (Signed)
Pre visit review using our clinic review tool, if applicable. No additional management support is needed unless otherwise documented below in the visit note. 

## 2015-09-09 NOTE — Assessment & Plan Note (Signed)
Chronic, stable. Continue pravastatin. HDL low otherwise well controlled.

## 2015-09-09 NOTE — Assessment & Plan Note (Addendum)
Thrombocytopenia + anemia, concern for MDS vs MM. Pt did not f/u with Dr Mike Gip - states was unaware. See below.

## 2015-09-09 NOTE — Assessment & Plan Note (Signed)
Chronic, stable. Continue diet controlled management.

## 2015-09-09 NOTE — Assessment & Plan Note (Signed)
Declines re-imaging at this time - last done 2014.

## 2015-09-09 NOTE — Assessment & Plan Note (Addendum)
Advanced directives - has not set up yet. Would like wife to make medical decisions. Declines prolonged life support. Wouldn't want to be kept alive if <50% chance would survive. Packet provided today - did not complete last year.

## 2015-09-16 ENCOUNTER — Telehealth: Payer: Self-pay | Admitting: Family Medicine

## 2015-09-16 NOTE — Telephone Encounter (Signed)
Would you mind calling and speaking with his brother? I saw that you had offered to at his visit and I'm afraid that I don't know what to tell him. Thanks!

## 2015-09-16 NOTE — Telephone Encounter (Signed)
Spoke w/ brother.

## 2015-09-16 NOTE — Telephone Encounter (Signed)
Brother, Richard [facility]) called and wanted details about pt's recent visit with PCP.  States his brother is increasingly hard of hearing and will shake his head "yes" like he is understanding when he can't hear.  Pt stated he needed to go to the "cancer" doctor, but couldn't explain why.  Spoke with CMA KD, she will return Richard's call.  Best number to call is [phone]

## 2015-09-30 DIAGNOSIS — D225 Melanocytic nevi of trunk: Secondary | ICD-10-CM | POA: Diagnosis not present

## 2015-09-30 DIAGNOSIS — Z85828 Personal history of other malignant neoplasm of skin: Secondary | ICD-10-CM | POA: Diagnosis not present

## 2015-09-30 DIAGNOSIS — L570 Actinic keratosis: Secondary | ICD-10-CM | POA: Diagnosis not present

## 2015-09-30 DIAGNOSIS — D044 Carcinoma in situ of skin of scalp and neck: Secondary | ICD-10-CM | POA: Diagnosis not present

## 2015-09-30 DIAGNOSIS — Z08 Encounter for follow-up examination after completed treatment for malignant neoplasm: Secondary | ICD-10-CM | POA: Diagnosis not present

## 2015-09-30 DIAGNOSIS — D485 Neoplasm of uncertain behavior of skin: Secondary | ICD-10-CM | POA: Diagnosis not present

## 2015-09-30 DIAGNOSIS — Z1283 Encounter for screening for malignant neoplasm of skin: Secondary | ICD-10-CM | POA: Diagnosis not present

## 2015-09-30 DIAGNOSIS — L821 Other seborrheic keratosis: Secondary | ICD-10-CM | POA: Diagnosis not present

## 2015-09-30 DIAGNOSIS — C4441 Basal cell carcinoma of skin of scalp and neck: Secondary | ICD-10-CM | POA: Diagnosis not present

## 2015-09-30 DIAGNOSIS — C44329 Squamous cell carcinoma of skin of other parts of face: Secondary | ICD-10-CM | POA: Diagnosis not present

## 2015-10-03 ENCOUNTER — Ambulatory Visit
Admission: RE | Admit: 2015-10-03 | Discharge: 2015-10-03 | Disposition: A | Discharge status: Home / Self Care | Payer: Medicare Other | Source: Ambulatory Visit | Attending: Hematology and Oncology | Admitting: Hematology and Oncology

## 2015-10-03 ENCOUNTER — Encounter: Payer: Self-pay | Admitting: Hematology and Oncology

## 2015-10-03 ENCOUNTER — Inpatient Hospital Stay: Payer: Medicare Other | Attending: Hematology and Oncology | Admitting: Hematology and Oncology

## 2015-10-03 VITALS — BP 155/67 | HR 74 | Temp 97.2°F | Ht 65.0 in | Wt 166.2 lb

## 2015-10-03 DIAGNOSIS — Z87891 Personal history of nicotine dependence: Secondary | ICD-10-CM

## 2015-10-03 DIAGNOSIS — I10 Essential (primary) hypertension: Secondary | ICD-10-CM | POA: Diagnosis not present

## 2015-10-03 DIAGNOSIS — D696 Thrombocytopenia, unspecified: Secondary | ICD-10-CM

## 2015-10-03 DIAGNOSIS — M1990 Unspecified osteoarthritis, unspecified site: Secondary | ICD-10-CM | POA: Insufficient documentation

## 2015-10-03 DIAGNOSIS — R531 Weakness: Secondary | ICD-10-CM | POA: Insufficient documentation

## 2015-10-03 DIAGNOSIS — G2581 Restless legs syndrome: Secondary | ICD-10-CM | POA: Insufficient documentation

## 2015-10-03 DIAGNOSIS — E785 Hyperlipidemia, unspecified: Secondary | ICD-10-CM | POA: Insufficient documentation

## 2015-10-03 DIAGNOSIS — Z7982 Long term (current) use of aspirin: Secondary | ICD-10-CM | POA: Diagnosis not present

## 2015-10-03 DIAGNOSIS — D4621 Refractory anemia with excess of blasts 1: Secondary | ICD-10-CM | POA: Diagnosis not present

## 2015-10-03 DIAGNOSIS — Z87311 Personal history of (healed) other pathological fracture: Secondary | ICD-10-CM | POA: Diagnosis not present

## 2015-10-03 DIAGNOSIS — D472 Monoclonal gammopathy: Secondary | ICD-10-CM

## 2015-10-03 DIAGNOSIS — E039 Hypothyroidism, unspecified: Secondary | ICD-10-CM | POA: Diagnosis not present

## 2015-10-03 DIAGNOSIS — G4730 Sleep apnea, unspecified: Secondary | ICD-10-CM | POA: Insufficient documentation

## 2015-10-03 DIAGNOSIS — R5383 Other fatigue: Secondary | ICD-10-CM

## 2015-10-03 DIAGNOSIS — R0602 Shortness of breath: Secondary | ICD-10-CM | POA: Diagnosis not present

## 2015-10-03 DIAGNOSIS — M818 Other osteoporosis without current pathological fracture: Secondary | ICD-10-CM

## 2015-10-03 DIAGNOSIS — M8580 Other specified disorders of bone density and structure, unspecified site: Secondary | ICD-10-CM | POA: Diagnosis not present

## 2015-10-03 DIAGNOSIS — Z800 Family history of malignant neoplasm of digestive organs: Secondary | ICD-10-CM | POA: Insufficient documentation

## 2015-10-03 DIAGNOSIS — Z79899 Other long term (current) drug therapy: Secondary | ICD-10-CM | POA: Diagnosis not present

## 2015-10-03 DIAGNOSIS — J449 Chronic obstructive pulmonary disease, unspecified: Secondary | ICD-10-CM | POA: Diagnosis not present

## 2015-10-03 NOTE — Progress Notes (Signed)
Patient here for follow up. No concerns today. Patient has questions about up coming diagnostic procedure. Medication reconciliation completed per patient and brother. Not clear of what medications he is on.

## 2015-10-03 NOTE — Progress Notes (Signed)
[facility] day:  10/03/2015   Chief Complaint: Charles Rodgers is an 82 y.o. male with anemia, thrombocytopenia, chronic renal insufficiency, and a monoclonal gammopathy who is seen for reassessment.  HPI: The patient was last seen in the medical oncology clinic on 11/15/2014.  At that time, his work-up was reviewed.  CBC revealed a hematocrit of 29.7, hemoglobin 9.6, MCV 94.4, platelets 84,000, and WBC 5900.  Peripheral smear suggested possible myelofibrosis or a myelodysplastic syndrome. The significance of his elevated kappa free light chains was unclear.  We discussed the lucencies in his bones possibly suggesting myeloma.  A bone marrow aspirate and biopsy was discussed.  The patient has had interval melanoma surgery.  He states no lymph nodes were positive, but "the surgery was fierce".  Symptomatically, his weight has been up and down. He denies any fevers or sweats.  He denies any adenopathy.  He denies any excess bruising or bleeding.  Overall, he feels good except for fatigue.  Recent labs from 08/27/2015 revealed a hematocrit of 28.0, hemoglobin 9.2, MCV 88.8, platelets 75,000, WBC 5900 with an ANC of 4400.  Creatinine is 2.06 (1.61 on 12/21/2014). Calcium is 8.9.  Albumen is 3.6.   Past Medical History  Diagnosis Date  . Arthritis   . Diabetes mellitus type 2, controlled ([place])   . Glaucoma   . HTN (hypertension)   . HLD (hyperlipidemia)   . CKD (chronic kidney disease) stage 3, GFR 30-59 ml/min     [place] -> 2017 requested PCP start following  . Urine incontinence   . BPH (benign prostatic hypertrophy)     with elevated PSA, s/p benign biopsy, followed yearly by [facility] (last seen 01/16/2013)  . Secondary hyperparathyroidism ([place])   . Hearing loss   . Melanoma ([place]) 2014    nose and left hand  . Thyroid nodule 02/2013    right upper lobe - s/p biopsy - benign follicular nodule  . Subclavian artery stenosis, right 2014     found on carotid US, seen [facility], recommended recheck 1 yr at vascular lab with Dr. Delana Meyer  . COPD (chronic obstructive pulmonary disease) ([place]) 2016    by xray  . IgM monoclonal gammopathy of uncertain significance 08/27/2014    07/2014 suggest rpt 6 mo.  . Malignant melanoma of left upper arm ([place]) 11/14/2014    s/p excision complicated by infected axillary seroma (Dr Carmin Muskrat)    Past Surgical History  Procedure Laterality Date  . Appendectomy  1938  . Arm surgery      Right after trauma  . US echocardiography  06/2007    EF 55%, mod dil LA, mild LVH,mild pulm HTN  . Carotid US  07/2007    WNL  . 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 Biopsy thyroid Right 33/3545    benign follicular nodule    Family History  Problem Relation Age of Onset  . Cancer Mother     stomach  . Cancer Brother     colon  . Cancer Brother     colon  . Cancer Sister     hodgkin's lymphoma  . Cancer Brother     prostate  . Diabetes Brother   . Coronary artery disease Neg Hx   . Stroke Neg Hx     Social History:  reports that he quit smoking about 48 years ago. His smoking use included Cigarettes. He has  a 7.5 pack-year smoking history. He has never used smokeless tobacco. He reports that he does not drink alcohol or use illicit drugs.  The patient is accompanied by his son.  Allergies: No Known Allergies  Current Medications: Current Outpatient Prescriptions  Medication Sig Dispense Refill  . amLODipine (NORVASC) 5 MG tablet TAKE 1 TABLET BY MOUTH ONCE A DAY 90 tablet 1  . aspirin (ASPIRIN EC) 81 MG EC tablet Take 81 mg by mouth daily. Swallow whole.    . Blood Glucose Monitoring Suppl (ONE TOUCH ULTRA 2) W/DEVICE KIT     . brimonidine (ALPHAGAN) 0.2 % ophthalmic solution Place 1 drop into both eyes 2 (two) times daily.    . Calcium Carbonate-Vitamin D (CALTRATE 600+D PO) Take by mouth daily.    Sarajane Marek Sodium 30-100 MG CAPS Take  by mouth daily.    . dorzolamide-timolol (COSOPT) 22.3-6.8 MG/ML ophthalmic solution     . doxazosin (CARDURA) 8 MG tablet TAKE ONE TABLET BY MOUTH AT BEDTIME 90 tablet 3  . fish oil-omega-3 fatty acids 1000 MG capsule Take 2 g by mouth daily.    . furosemide (LASIX) 20 MG tablet Take 20 mg by mouth daily.    Marland Kitchen latanoprost (XALATAN) 0.005 % ophthalmic solution     . lisinopril-hydrochlorothiazide (PRINZIDE,ZESTORETIC) 20-25 MG tablet TAKE ONE TABLET BY MOUTH EVERY DAY 90 tablet 3  . Multiple Vitamins-Minerals (PRESERVISION AREDS) CAPS Take 2 capsules by mouth daily.    . ONE TOUCH ULTRA TEST test strip USE TO CHECK BLOOD SUGAR 3 TIMES DAILY 100 each 2  . ONETOUCH DELICA LANCETS MISC Use as directed    . potassium chloride (K-DUR) 10 MEQ tablet TAKE 1 TABLET BY MOUTH DAILY AS NEEDED 30 tablet 3  . pravastatin (PRAVACHOL) 40 MG tablet TAKE ONE TABLET BY MOUTH AT BEDTIME 90 tablet 3  . timolol (BETIMOL) 0.5 % ophthalmic solution Place 1 drop into both eyes 2 (two) times daily.     No current facility-administered medications for this visit.    Review of Systems:  GENERAL:  Feels good except for fatigue.  No fevers or sweats .  Weight down 5 pounds. PERFORMANCE STATUS (ECOG):  1 HEENT:  Hard of hearing.  No visual changes, runny nose, sore throat, mouth sores or tenderness. Lungs: No shortness of breath or cough.  No hemoptysis. Cardiac:  No chest pain, palpitations, orthopnea, or PND. GI:  No nausea, vomiting, diarrhea, constipation, melena or hematochezia. GU:  No urgency, frequency, dysuria, or hematuria. Musculoskeletal:  No back pain.  Arthritis pain.  No muscle tenderness. Extremities:  No pain or swelling. Skin:  Melanoma s/p removal.  No rashes or skin changes. Neuro:  No headache, numbness or weakness, balance or coordination issues. Endocrine:  No diabetes, thyroid issues, hot flashes or night sweats. Psych:  No mood changes, depression or anxiety. Pain:  No focal pain. Review  of systems:  All other systems reviewed and found to be negative.   Physical Exam: Blood pressure 155/67, pulse 74, temperature 97.2 F (36.2 C), temperature source Tympanic, height 5' 5"  (1.651 m), weight 166 lb 3.6 oz (75.4 kg). GENERAL:  Well developed, well nourished, elderly gentleman sitting comfortably in the exam room in no acute distress. MENTAL STATUS:  Alert and oriented to person, place and time. HEAD:  Wearing a cap.  White hair.  Male pattern baldness.  Normocephalic, atraumatic, face symmetric, no Cushingoid features. EYES:  Glasses.  Hazel eyes.  Pupils equal round and reactive to light and  accomodation.  No conjunctivitis or scleral icterus. ENT:  Hearing aides.  Oropharynx clear without lesion.  Tongue normal. Mucous membranes moist.  RESPIRATORY:  Clear to auscultation without rales, wheezes or rhonchi. CARDIOVASCULAR:  Regular rate and rhythm without murmur, rub or gallop. ABDOMEN:  Ventral hernia.  Soft, non-tender, with active bowel sounds, and no hepatosplenomegaly.  No masses. SKIN:  Changes due to sun exposure.  No rashes, ulcers or lesions. EXTREMITIES: Trace ankle edema.  No skin discoloration or tenderness.  No palpable cords. LYMPH NODES: No palpable cervical, supraclavicular, axillary or inguinal adenopathy  NEUROLOGICAL: Unremarkable. PSYCH:  Appropriate.   No visits with results within 3 Day(s) from this visit. Latest known visit with results is:  Lab on 08/27/2015  Component Date Value Ref Range Status  . Cholesterol 08/27/2015 100  0 - 200 mg/dL Final   ATP III Classification       Desirable:  < 200 mg/dL               Borderline High:  200 - 239 mg/dL          High:  > = 240 mg/dL  . Triglycerides 08/27/2015 74.0  0.0 - 149.0 mg/dL Final   Normal:  <150 mg/dLBorderline High:  150 - 199 mg/dL  . HDL 08/27/2015 31.00* >39.00 mg/dL Final  . VLDL 08/27/2015 14.8  0.0 - 40.0 mg/dL Final  . LDL Cholesterol 08/27/2015 55  0 - 99 mg/dL Final  . Total CHOL/HDL  Ratio 08/27/2015 3   Final                  Men          Women1/2 Average Risk     3.4          3.3Average Risk          5.0          4.42X Average Risk          9.6          7.13X Average Risk          15.0          11.0                      . NonHDL 08/27/2015 69.33   Final   NOTE:  Non-HDL goal should be 30 mg/dL higher than patient's LDL goal (i.e. LDL goal of < 70 mg/dL, would have non-HDL goal of < 100 mg/dL)  . WBC 08/27/2015 5.9  4.0 - 10.5 K/uL Final  . RBC 08/27/2015 3.15* 4.22 - 5.81 Mil/uL Final  . Hemoglobin 08/27/2015 9.2* 13.0 - 17.0 g/dL Final  . HCT 08/27/2015 28.0* 39.0 - 52.0 % Final  . MCV 08/27/2015 88.8  78.0 - 100.0 fl Final  . MCHC 08/27/2015 32.8  30.0 - 36.0 g/dL Final  . RDW 08/27/2015 17.8* 11.5 - 15.5 % Final  . Platelets 08/27/2015 75.0* 150.0 - 400.0 K/uL Final  . Neutrophils Relative % 08/27/2015 74.0  43.0 - 77.0 % Final  . Lymphocytes Relative 08/27/2015 15.8  12.0 - 46.0 % Final  . Monocytes Relative 08/27/2015 8.5  3.0 - 12.0 % Final  . Eosinophils Relative 08/27/2015 1.5  0.0 - 5.0 % Final  . Basophils Relative 08/27/2015 0.2  0.0 - 3.0 % Final  . Neutro Abs 08/27/2015 4.4  1.4 - 7.7 K/uL Final  . Lymphs Abs 08/27/2015 0.9  0.7 - 4.0 K/uL Final  . Monocytes  Absolute 08/27/2015 0.5  0.1 - 1.0 K/uL Final  . Eosinophils Absolute 08/27/2015 0.1  0.0 - 0.7 K/uL Final  . Basophils Absolute 08/27/2015 0.0  0.0 - 0.1 K/uL Final  . Sodium 08/27/2015 139  135 - 145 mEq/L Final  . Potassium 08/27/2015 4.1  3.5 - 5.1 mEq/L Final  . Chloride 08/27/2015 110  96 - 112 mEq/L Final  . CO2 08/27/2015 22  19 - 32 mEq/L Final  . Calcium 08/27/2015 8.9  8.4 - 10.5 mg/dL Final  . Albumin 08/27/2015 3.6  3.5 - 5.2 g/dL Final  . BUN 08/27/2015 60* 6 - 23 mg/dL Final  . Creatinine, Ser 08/27/2015 2.06* 0.40 - 1.50 mg/dL Final  . Glucose, Bld 08/27/2015 132* 70 - 99 mg/dL Final  . Phosphorus 08/27/2015 4.1  2.3 - 4.6 mg/dL Final  . GFR 08/27/2015 32.49* >60.00 mL/min Final   . TSH 08/27/2015 1.35  0.35 - 4.50 uIU/mL Final  . Hgb A1c MFr Bld 08/27/2015 6.7* 4.6 - 6.5 % Final   Glycemic Control Guidelines for People with Diabetes:Non Diabetic:  <6%Goal of Therapy: <7%Additional Action Suggested:  >8%     Assessment:  Charles Rodgers is an 82 y.o. male with a 2 year history of a normocytic anemia, thrombocytopenia and a normal white count. Serum protein electrophoresis on 08/15/2014 revealed a 0.3 g/dL monoclonal spike. He has chronic renal insufficiency (Cr 1.7-2.03) over the past 2 years without trend.  He denies any new medications or herbal products.  Peripheral smear on 08/27/2014 revealed no abnormal white blood cells, thrombocytopenia without clumping and some large platelets. There were elliptocytes, teardrop red blood cells, and schistocytes. Teardrop RBCs and elliptocytes suggest possible extramedullary hematopoiesis (myelofibrosis).  Myelodysplastic syndrome with deletion 20q is a consideration.  Work-up on 09/11/2014 included CBC with a hematocrit 30.4, hemoglobin 9.7, MCV 95.1, platelets 80,000, white count 5300 with an [place] of 3800. Differential was unremarkable.  B12, folate, TSH, ANA, PT, PTT, LDH.  Reticulocyte count was low at 3.19%. Ferritin was 47 with a iron saturation of 20% and a TIBC of 265 (normal).  Immunoglobulin levels were normal. SPEP revealed a 0.4 g/dL faint band in the gamma region suspicious for monoclonal immunoglobulin.  Kappa free light chains were 147.45, lambda free light chains 32.32, with a kappa lambda ratio of 4.56 (0.26-1.65).    24-hour urine revealed kappa free light chains of 30.6, lambda free light chains 2.25 and a ratio 13.6 (2.04-10.37). 24 hour urine protein electrophoresis revealed no monoclonal protein.  Bone survey on 10/03/2014 revealed subtle lucencies within the calvarium, left femoral head, and proximal left tibia which could reflect myelomatous involvement.   He rarely eats meat.  He denies any melena or  hematochezia.  He has never had a colonoscopy.  Guaiac cards were negative 2 on 09/16/2014.   Symptomatically, he is fatigued.  Exam reveals no adenopathy or hepatosplenomegaly.  Plan: 1.  Discuss work-up from last visit.  Discuss last available counts in 07/2015.  Discuss differential diagnosis (myelofibrosis, myelodysplastic syndrome, myeloma)  Discuss anemia and thrombocytopenia.  Discuss obtaining a bone marrow for diagnosis and treatment.  Discuss procedure in detail with risks and benefits.  Discuss option of transfusion support only if patient does not desire treatment for underlying disease.  Patient consented to procedure. 2.  Labs prior to procedure:  CBC with diff, PT, PTT. 3.  Bone survey re:  follow-up lucenies 4.  Schedule CT guided bone marrow aspirate and biopsy. 5.  RTC 10-14 days after  bone marrow biopsy for review and discussion regarding direction of therapy.   Lequita Asal, MD  10/03/2015, 1:48 PM

## 2015-10-04 ENCOUNTER — Other Ambulatory Visit: Payer: Self-pay

## 2015-10-04 DIAGNOSIS — D472 Monoclonal gammopathy: Secondary | ICD-10-CM

## 2015-10-11 ENCOUNTER — Telehealth: Payer: Self-pay | Admitting: *Deleted

## 2015-10-11 NOTE — Telephone Encounter (Signed)
Called home of pt and got wife on the phone and explained to her that his blood work that he needs can be done the morning of his procedure. I have spoke to the nurse that works in the [place] where the [place]. Is done and the labs will be done that morning of the procedure.  Wife will tell her husband when he gets in today.  I am cancelling lab for Monday and it will be done tues at time of bone marrow bx,.

## 2015-10-11 NOTE — Telephone Encounter (Signed)
-----   Message from Franchot Heidelberg, RN sent at 10/04/2015  6:06 PM EDT ----- Regarding: RE: When are patient's labs? Sent message to scheduling looks like lab was not scheduled.  I put labs in for 7/17 the day before bone marrow.  Will need expected date changed if this is not the day he comes in for them.  Thank you,   Theadora Rama ----- Message -----    From: Lequita Asal, MD    Sent: 10/03/2015  11:20 PM      To: Franchot Heidelberg, RN Subject: When are patient's labs?

## 2015-10-14 ENCOUNTER — Inpatient Hospital Stay: Payer: Medicare Other

## 2015-10-15 ENCOUNTER — Ambulatory Visit
Admission: RE | Admit: 2015-10-15 | Discharge: 2015-10-15 | Disposition: A | Discharge status: Home / Self Care | Payer: Medicare Other | Source: Ambulatory Visit | Attending: Hematology and Oncology | Admitting: Hematology and Oncology

## 2015-10-15 ENCOUNTER — Telehealth: Payer: Self-pay | Admitting: Family Medicine

## 2015-10-15 DIAGNOSIS — Z800 Family history of malignant neoplasm of digestive organs: Secondary | ICD-10-CM | POA: Insufficient documentation

## 2015-10-15 DIAGNOSIS — E785 Hyperlipidemia, unspecified: Secondary | ICD-10-CM | POA: Diagnosis not present

## 2015-10-15 DIAGNOSIS — H9190 Unspecified hearing loss, unspecified ear: Secondary | ICD-10-CM | POA: Diagnosis not present

## 2015-10-15 DIAGNOSIS — I129 Hypertensive chronic kidney disease with stage 1 through stage 4 chronic kidney disease, or unspecified chronic kidney disease: Secondary | ICD-10-CM | POA: Insufficient documentation

## 2015-10-15 DIAGNOSIS — H409 Unspecified glaucoma: Secondary | ICD-10-CM | POA: Diagnosis not present

## 2015-10-15 DIAGNOSIS — J449 Chronic obstructive pulmonary disease, unspecified: Secondary | ICD-10-CM | POA: Insufficient documentation

## 2015-10-15 DIAGNOSIS — Z823 Family history of stroke: Secondary | ICD-10-CM | POA: Insufficient documentation

## 2015-10-15 DIAGNOSIS — Z87891 Personal history of nicotine dependence: Secondary | ICD-10-CM | POA: Diagnosis not present

## 2015-10-15 DIAGNOSIS — I272 Other secondary pulmonary hypertension: Secondary | ICD-10-CM | POA: Insufficient documentation

## 2015-10-15 DIAGNOSIS — D696 Thrombocytopenia, unspecified: Secondary | ICD-10-CM

## 2015-10-15 DIAGNOSIS — C9000 Multiple myeloma not having achieved remission: Secondary | ICD-10-CM | POA: Diagnosis not present

## 2015-10-15 DIAGNOSIS — E1122 Type 2 diabetes mellitus with diabetic chronic kidney disease: Secondary | ICD-10-CM | POA: Insufficient documentation

## 2015-10-15 DIAGNOSIS — Z8042 Family history of malignant neoplasm of prostate: Secondary | ICD-10-CM | POA: Insufficient documentation

## 2015-10-15 DIAGNOSIS — R7989 Other specified abnormal findings of blood chemistry: Secondary | ICD-10-CM | POA: Diagnosis not present

## 2015-10-15 DIAGNOSIS — N400 Enlarged prostate without lower urinary tract symptoms: Secondary | ICD-10-CM | POA: Diagnosis not present

## 2015-10-15 DIAGNOSIS — R32 Unspecified urinary incontinence: Secondary | ICD-10-CM | POA: Insufficient documentation

## 2015-10-15 DIAGNOSIS — Z7982 Long term (current) use of aspirin: Secondary | ICD-10-CM | POA: Diagnosis not present

## 2015-10-15 DIAGNOSIS — Z807 Family history of other malignant neoplasms of lymphoid, hematopoietic and related tissues: Secondary | ICD-10-CM | POA: Insufficient documentation

## 2015-10-15 DIAGNOSIS — I708 Atherosclerosis of other arteries: Secondary | ICD-10-CM | POA: Diagnosis not present

## 2015-10-15 DIAGNOSIS — D649 Anemia, unspecified: Secondary | ICD-10-CM | POA: Insufficient documentation

## 2015-10-15 DIAGNOSIS — D469 Myelodysplastic syndrome, unspecified: Secondary | ICD-10-CM | POA: Diagnosis not present

## 2015-10-15 DIAGNOSIS — Z833 Family history of diabetes mellitus: Secondary | ICD-10-CM | POA: Insufficient documentation

## 2015-10-15 DIAGNOSIS — N2581 Secondary hyperparathyroidism of renal origin: Secondary | ICD-10-CM | POA: Diagnosis not present

## 2015-10-15 DIAGNOSIS — M1990 Unspecified osteoarthritis, unspecified site: Secondary | ICD-10-CM | POA: Diagnosis not present

## 2015-10-15 DIAGNOSIS — Z8249 Family history of ischemic heart disease and other diseases of the circulatory system: Secondary | ICD-10-CM | POA: Insufficient documentation

## 2015-10-15 DIAGNOSIS — E041 Nontoxic single thyroid nodule: Secondary | ICD-10-CM | POA: Insufficient documentation

## 2015-10-15 DIAGNOSIS — D472 Monoclonal gammopathy: Secondary | ICD-10-CM | POA: Diagnosis not present

## 2015-10-15 DIAGNOSIS — N183 Chronic kidney disease, stage 3 (moderate): Secondary | ICD-10-CM | POA: Insufficient documentation

## 2015-10-15 DIAGNOSIS — Z85820 Personal history of malignant melanoma of skin: Secondary | ICD-10-CM | POA: Insufficient documentation

## 2015-10-15 LAB — DIFFERENTIAL
Basophils Absolute: 0 10*3/uL (ref 0–0.1)
Basophils Relative: 0 %
EOS ABS: 0.2 10*3/uL (ref 0–0.7)
LYMPHS ABS: 0.8 10*3/uL — AB (ref 1.0–3.6)
Lymphocytes Relative: 14 %
Monocytes Absolute: 0.6 10*3/uL (ref 0.2–1.0)
Monocytes Relative: 10 %
Neutro Abs: 4 10*3/uL (ref 1.4–6.5)

## 2015-10-15 LAB — PROTIME-INR
INR: 1.27
PROTHROMBIN TIME: 16 s — AB (ref 11.4–15.0)

## 2015-10-15 LAB — CBC
HCT: 30.1 % — ABNORMAL LOW (ref 40.0–52.0)
HEMOGLOBIN: 10 g/dL — AB (ref 13.0–18.0)
MCH: 30.6 pg (ref 26.0–34.0)
MCHC: 33.2 g/dL (ref 32.0–36.0)
MCV: 92 fL (ref 80.0–100.0)
Platelets: 82 10*3/uL — ABNORMAL LOW (ref 150–440)
RBC: 3.27 MIL/uL — ABNORMAL LOW (ref 4.40–5.90)
RDW: 16.7 % — AB (ref 11.5–14.5)
WBC: 5.6 10*3/uL (ref 3.8–10.6)

## 2015-10-15 LAB — APTT: aPTT: 38 seconds — ABNORMAL HIGH (ref 24–36)

## 2015-10-15 MED ORDER — FENTANYL CITRATE (PF) 100 MCG/2ML IJ SOLN
INTRAMUSCULAR | Status: AC | PRN
  Administered 2015-10-15 (×2): 25 ug via INTRAVENOUS

## 2015-10-15 MED ORDER — MIDAZOLAM HCL 2 MG/2ML IJ SOLN
INTRAMUSCULAR | Status: AC | PRN
  Administered 2015-10-15 (×2): 0.5 mg via INTRAVENOUS

## 2015-10-15 MED ORDER — SODIUM CHLORIDE 0.9 % IV SOLN
INTRAVENOUS | Status: DC
  Administered 2015-10-15: 09:00:00 via INTRAVENOUS

## 2015-10-15 NOTE — Discharge Instructions (Signed)
Bo Bone Marrow Aspiration and Bone Marrow Biopsy, Care After Refer to this sheet in the next few weeks. These instructions provide you with information about caring for yourself after your procedure. Your health care provider may also give you more specific instructions. Your treatment has been planned according to current medical practices, but problems sometimes occur. Call your health care provider if you have any problems or questions after your procedure. WHAT TO EXPECT AFTER THE PROCEDURE After your procedure, it is common to have:  Soreness or tenderness around the puncture site.  Bruising. HOME CARE INSTRUCTIONS  Take medicines only as directed by your health care provider.  Follow your health care provider's instructions about:  Puncture site care.  Bandage (dressing) changes and removal.  Bathe and shower as directed by your health care provider.  Check your puncture site every day for signs of infection. Watch for:  Redness, swelling, or pain.  Fluid, blood, or pus.  Return to your normal activities as directed by your health care provider.  Keep all follow-up visits as directed by your health care provider. This is important. SEEK MEDICAL CARE IF:  You have a fever.  You have uncontrollable bleeding.  You have redness, swelling, or pain at the site of your puncture.  You have fluid, blood, or pus coming from your puncture site.   This information is not intended to replace advice given to you by your health care provider. Make sure you discuss any questions you have with your health care provider.   Document Released: 10/03/2004 Document Revised: 07/31/2014 Document Reviewed: 03/07/2014 Elsevier Interactive Patient Education 2016 [employer] Released: 03/19/2004 Document Revised: 07/31/2014 Document Reviewed: 03/07/2014 Elsevier Interactive Patient Education [employer].

## 2015-10-15 NOTE — Procedures (Signed)
Interventional Radiology Procedure Note  Procedure: CT guided aspirate and core biopsy of right iliac bone Complications: None Recommendations: - Bedrest supine x 1 hr - Follow biopsy results  Josclyn Rosales T. Malijah Lietz, M.D Pager:  [phone]   

## 2015-10-15 NOTE — OR Nursing (Signed)
fsbs this am 130 per pt around 7 am

## 2015-10-15 NOTE — Telephone Encounter (Signed)
Spoke with Charles Rodgers at [facility], pt is having a CT Biopsy today and when they completed EKG on pt, he presented with Afib, initial onset.  She states he is asymptematic but wanted PCP to be aware.

## 2015-10-15 NOTE — Telephone Encounter (Signed)
Charles Rodgers called from the specialist dept needing to sch a appt for pt to be seen for a new onset on Afib. Pt needs a referral to cardiologist. I did call your cell as well.  The phones are not allowing anyone to get through at [facility]. Thank you!

## 2015-10-15 NOTE — H&P (Signed)
Chief Complaint: Patient was seen in consultation today for bone marrow aspirate and biopsy at the request of [place] C  Referring Physician(s): [place] C  Patient Status: Outpatient  History of Present Illness: Charles Rodgers is a 82 y.o. male with a history of monoclonal gammopathy, anemia, thrombocytopenia and melanoma who now requires bone marrow biopsy for further hematologic work up.  He is currently asymptomatic.  Past Medical History  Diagnosis Date  . Arthritis   . Diabetes mellitus type 2, controlled ([place])   . Glaucoma   . HTN (hypertension)   . HLD (hyperlipidemia)   . CKD (chronic kidney disease) stage 3, GFR 30-59 ml/min     [place] -> 2017 requested PCP start following  . Urine incontinence   . BPH (benign prostatic hypertrophy)     with elevated PSA, s/p benign biopsy, followed yearly by [facility] (last seen 01/16/2013)  . Secondary hyperparathyroidism ([place])   . Hearing loss   . Melanoma ([place]) 2014    nose and left hand  . Thyroid nodule 02/2013    right upper lobe - s/p biopsy - benign follicular nodule  . Subclavian artery stenosis, right 2014    found on carotid US, seen [facility], recommended recheck 1 yr at vascular lab with Dr. Delana Meyer  . COPD (chronic obstructive pulmonary disease) ([place]) 2016    by xray  . IgM monoclonal gammopathy of uncertain significance 08/27/2014    07/2014 suggest rpt 6 mo.  . Malignant melanoma of left upper arm ([place]) 11/14/2014    s/p excision complicated by infected axillary seroma (Dr Carmin Muskrat)    Past Surgical History  Procedure Laterality Date  . Appendectomy  1938  . Arm surgery      Right after trauma  . US echocardiography  06/2007    EF 55%, mod dil LA, mild LVH,mild pulm HTN  . Carotid US  07/2007    WNL  . 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 Biopsy thyroid Right 12/2109    benign follicular nodule    Allergies: Review of patient's  allergies indicates no known allergies.  Medications: Prior to Admission medications   Medication Sig Start Date End Date Taking? Authorizing Provider  amLODipine (NORVASC) 5 MG tablet TAKE 1 TABLET BY MOUTH ONCE A DAY 09/04/15  Yes [name], MD  aspirin (ASPIRIN EC) 81 MG EC tablet Take 81 mg by mouth daily. Swallow whole.   Yes Historical Provider, MD  lisinopril-hydrochlorothiazide (PRINZIDE,ZESTORETIC) 20-25 MG tablet TAKE ONE TABLET BY MOUTH EVERY DAY 02/13/15  Yes [name], MD  Blood Glucose Monitoring Suppl (ONE TOUCH ULTRA 2) W/DEVICE KIT  09/07/11   Historical Provider, MD  brimonidine (ALPHAGAN) 0.2 % ophthalmic solution Place 1 drop into both eyes 2 (two) times daily.    Historical Provider, MD  Calcium Carbonate-Vitamin D (CALTRATE 600+D PO) Take by mouth daily.    Historical Provider, MD  Casanthranol-Docusate Sodium 30-100 MG CAPS Take by mouth daily.    Historical Provider, MD  dorzolamide-timolol (COSOPT) 22.3-6.8 MG/ML ophthalmic solution  08/17/14   Historical Provider, MD  doxazosin (CARDURA) 8 MG tablet TAKE ONE TABLET BY MOUTH AT BEDTIME 02/19/15   [name], MD  fish oil-omega-3 fatty acids 1000 MG capsule Take 2 g by mouth daily.    Historical Provider, MD  furosemide (LASIX) 20 MG tablet Take 20 mg by mouth daily. 09/10/11   [name], MD  latanoprost (XALATAN) 0.005 %  ophthalmic solution  11/10/12   Historical Provider, MD  Multiple Vitamins-Minerals (PRESERVISION AREDS) CAPS Take 2 capsules by mouth daily.    Historical Provider, MD  ONE TOUCH ULTRA TEST test strip USE TO CHECK BLOOD SUGAR 3 TIMES DAILY 07/22/15   [name], MD  [facility] LANCETS MISC Use as directed 09/07/11   Historical Provider, MD  potassium chloride (K-DUR) 10 MEQ tablet TAKE 1 TABLET BY MOUTH DAILY AS NEEDED 06/14/14   [name], MD  pravastatin (PRAVACHOL) 40 MG tablet TAKE ONE TABLET BY MOUTH AT BEDTIME 02/19/15   [name], MD  timolol (BETIMOL)  0.5 % ophthalmic solution Place 1 drop into both eyes 2 (two) times daily.    Historical Provider, MD     Family History  Problem Relation Age of Onset  . Cancer Mother     stomach  . Cancer Brother     colon  . Cancer Brother     colon  . Cancer Sister     hodgkin's lymphoma  . Cancer Brother     prostate  . Diabetes Brother   . Coronary artery disease Neg Hx   . Stroke Neg Hx     Social History   Social History  . Marital Status: Married    Spouse Name: N/A  . Number of Children: N/A  . Years of Education: N/A   Social History Main Topics  . Smoking status: Former Smoker -- 0.50 packs/day for 15 years    Types: Cigarettes    Quit date: 03/31/1967  . Smokeless tobacco: Never Used  . Alcohol Use: No  . Drug Use: No  . Sexual Activity: Not on file   Other Topics Concern  . Not on file   Social History Narrative   Caffeine: rare   Lives with wife, 1 dog (grown children)   Occupation: retired, worked Tourist information centre manager at [employer]   Edu: 5th grade   Activity: works Haematologist   Diet: some water, seldom fruits/vegetables    ECOG Status: 0 - Asymptomatic  Review of Systems: A 12 point ROS discussed and pertinent positives are indicated in the HPI above.  All other systems are negative.  Review of Systems  Constitutional: Negative.   HENT: Negative.   Respiratory: Negative.   Cardiovascular: Negative.   Gastrointestinal: Negative.   Genitourinary: Negative.   Musculoskeletal: Negative.   Neurological: Negative.   Hematological: Negative for adenopathy. Bruises/bleeds easily.    Vital Signs: There were no vitals taken for this visit.  Physical Exam  Constitutional: He is oriented to person, place, and time. No distress.  HENT:  Head: Normocephalic and atraumatic.  Neck: Neck supple. No JVD present. No thyromegaly present.  Cardiovascular: Normal rate, regular rhythm and normal heart sounds.  Exam reveals no gallop and no friction rub.   No murmur  heard. Pulmonary/Chest: Effort normal and breath sounds normal. No stridor. No respiratory distress. He has no wheezes. He has no rales.  Abdominal: Soft. Bowel sounds are normal. He exhibits no distension and no mass. There is no tenderness. There is no rebound and no guarding.  Musculoskeletal: He exhibits no edema or tenderness.  Neurological: He is alert and oriented to person, place, and time.  Skin: Skin is warm and dry. He is not diaphoretic.  Vitals reviewed.   Mallampati Score:  MD Evaluation Airway: WNL Heart: WNL Abdomen: WNL Chest/ Lungs: WNL ASA  Classification: 2 Mallampati/Airway Score: One  Imaging: Dg Bone Survey Met  10/03/2015  CLINICAL DATA:  Monoclonal gammopathy EXAM: METASTATIC BONE SURVEY COMPARISON:  Metastatic bone survey of 10/03/2014 FINDINGS: A single view of the chest shows the lungs to be clear. Mediastinal and hilar contours are unremarkable. The heart is borderline enlarged. Deformity of several right lateral ribs is noted which is stable and possibly due to prior fractures with healing. The lateral view of the cervical vertebrae are normal alignment with normal intervertebral disc spaces. Views of the right shoulder show mild degenerative joint disease. No radiolucent lesion is seen. Old healed fractures of the right lateral fifth, sixth, seventh and eighth ribs are noted. Views of the left shoulder show degenerative change with some loss of joint space. However no radiolucent lesion is noted. Views of both left and the right humerus show no definite radiolucent lesion. Views of both forearms show osteopenia but no discrete radiolucent lesion is seen. Two views of the thoracic spine show mild degenerative change but no compression deformity or radiolucent lesion is seen. Views of the lumbar spine again show lumbar curvature convex left with associated degenerative change. However no compression deformity or radiolucent lesion is evident. Views of the pelvis show  no definite radiolucent lesion. Small radiolucent lesions were questioned in the left femoral head previously but no definite abnormality is seen currently. Views of both femurs show no radiolucent lesion. Arterial calcifications are noted. Views of both lower legs also show some osteopenia but no definite radiolucent lesion. IMPRESSION: 1. No definite radiolucent lesion is seen. There is osteopenia diffusely, but no definite lesion is evident. 2. No active lung disease. Multiple old healed right lateral rib fractures. Electronically Signed   By: Ivar Drape M.D.   On: 10/03/2015 16:06    Labs:  CBC:  Recent Labs  11/15/14 1626 12/21/14 08/27/15 1114 10/15/15 0818  WBC 5.9 5.4 5.9 5.6  HGB 9.6* 9.3 9.2* 10.0*  HCT 29.7*  --  28.0* 30.1*  PLT 84*  --  75.0* 82*    COAGS: No results for input(s): INR, APTT in the last 8760 hours.  BMP:  Recent Labs  12/21/14 03/04/15 1305 08/27/15 1114  NA  --  139 139  K 3.9 3.7 4.1  CL  --  108 110  CO2  --  23 22  GLUCOSE  --  116* 132*  BUN  --  44* 60*  CALCIUM  --  8.7 8.9  CREATININE 1.61 1.82* 2.06*    LIVER FUNCTION TESTS:  Recent Labs  03/04/15 1305 08/27/15 1114  ALBUMIN 3.5 3.6    TUMOR MARKERS: No results for input(s): AFPTM, CEA, CA199, CHROMGRNA in the last 8760 hours.  Assessment and Plan:  CT guided bone marrow biopsy discussed with the patient and his brother.  Consent obtained.  Will proceed with procedure today.  Thank you for this interesting consult.  I greatly enjoyed meeting Alontae Chaloux [facility] and look forward to participating in their care.  A copy of this report was sent to the requesting provider on this date.  Electronically SignedAletta Edouard T 10/15/2015, 8:43 AM   I spent a total of 15 Minutes in face to face in clinical consultation, greater than 50% of which was counseling/coordinating care for bone marrow biopsy.

## 2015-10-17 NOTE — Telephone Encounter (Signed)
plz schedule appt to evaluate and for EKG.

## 2015-10-17 NOTE — Telephone Encounter (Signed)
Appt scheduled

## 2015-10-22 DIAGNOSIS — H353132 Nonexudative age-related macular degeneration, bilateral, intermediate dry stage: Secondary | ICD-10-CM | POA: Diagnosis not present

## 2015-10-22 DIAGNOSIS — H401131 Primary open-angle glaucoma, bilateral, mild stage: Secondary | ICD-10-CM | POA: Diagnosis not present

## 2015-10-24 ENCOUNTER — Telehealth: Payer: Self-pay | Admitting: *Deleted

## 2015-10-24 ENCOUNTER — Ambulatory Visit (INDEPENDENT_AMBULATORY_CARE_PROVIDER_SITE_OTHER): Payer: Medicare Other | Admitting: Family Medicine

## 2015-10-24 ENCOUNTER — Encounter: Payer: Self-pay | Admitting: Family Medicine

## 2015-10-24 VITALS — BP 134/60 | HR 64 | Temp 97.9°F | Wt 162.5 lb

## 2015-10-24 DIAGNOSIS — R0789 Other chest pain: Secondary | ICD-10-CM | POA: Diagnosis not present

## 2015-10-24 DIAGNOSIS — I4891 Unspecified atrial fibrillation: Secondary | ICD-10-CM | POA: Diagnosis not present

## 2015-10-24 DIAGNOSIS — D472 Monoclonal gammopathy: Secondary | ICD-10-CM | POA: Diagnosis not present

## 2015-10-24 MED ORDER — APIXABAN 2.5 MG PO TABS: 2.5000 mg | ORAL_TABLET | Freq: Two times a day (BID) | ORAL | 3 refills | Status: DC

## 2015-10-24 NOTE — Assessment & Plan Note (Signed)
New.  Discussed etiology and risks of atrial fibrillation. Discussed stroke risk vs bleeding risk on blood thinner. CHADSVASC2 score 4.  Pt has not been taking aspirin 81mg .  Does not want to see cardiology. Agrees to trial of eliquis if he can afford - 2.5mg  bid sent to pharmacy. RTC 1 mo f/u visit.  EKG - atrial fibrillation with bradycardia, septal Q waves.

## 2015-10-24 NOTE — Progress Notes (Addendum)
BP 134/60 (BP Location: Left Arm, Patient Position: Sitting, Cuff Size: Normal)   Pulse 64   Temp 97.9 F (36.6 C) (Oral)   Wt 162 lb 8 oz (73.7 kg)   SpO2 99%   BMI 27.04 kg/m    CC: discuss possible Afib. Subjective:    Patient ID: Charles Rodgers, male    DOB: [birth year], 82 y.o.   MRN: [id number]  HPI: Charles Rodgers is a 82 y.o. male presenting on 10/24/2015 for ? of A fib   Here with brother today. Had 89th birthday yesterday!  Recent CT bone marrow aspirate and biopsy 10/15/2015 for IgM MGUS and thrombocytopenia followed by Dr Mike Gip oncology/hematology. Has f/u with onc to discuss results next week.   During CT biopsy there was concern on rhythm of atrial fibrillation so patient was asked to return for further evaluation.  Patient denies chest pain or tightness or dyspnea. No dizziness. Denies palpitations. Pt denies recent falls but brother states he does stay unsteady. [place] did [place] 6[place], hit forehead on rocks. He regularly uses cane which has helped.   Relevant past medical, surgical, family and social history reviewed and updated as indicated. Interim medical history since our last visit reviewed. Allergies and medications reviewed and updated. Current Outpatient Prescriptions on File Prior to Visit  Medication Sig  . amLODipine (NORVASC) 5 MG tablet TAKE 1 TABLET BY MOUTH ONCE A DAY  . Blood Glucose Monitoring Suppl (ONE TOUCH ULTRA 2) W/DEVICE KIT   . brimonidine (ALPHAGAN) 0.2 % ophthalmic solution Place 1 drop into both eyes 2 (two) times daily.  . Calcium Carbonate-Vitamin D (CALTRATE 600+D PO) Take by mouth daily.  Sarajane Marek Sodium 30-100 MG CAPS Take by mouth daily.  . dorzolamide-timolol (COSOPT) 22.3-6.8 MG/ML ophthalmic solution   . doxazosin (CARDURA) 8 MG tablet TAKE ONE TABLET BY MOUTH AT BEDTIME  . fish oil-omega-3 fatty acids 1000 MG capsule Take 2 g by mouth daily.  .  furosemide (LASIX) 20 MG tablet Take 20 mg by mouth daily.  Marland Kitchen latanoprost (XALATAN) 0.005 % ophthalmic solution   . lisinopril-hydrochlorothiazide (PRINZIDE,ZESTORETIC) 20-25 MG tablet TAKE ONE TABLET BY MOUTH EVERY DAY  . Multiple Vitamins-Minerals (PRESERVISION AREDS) CAPS Take 2 capsules by mouth daily.  . ONE TOUCH ULTRA TEST test strip USE TO CHECK BLOOD SUGAR 3 TIMES DAILY  . ONETOUCH DELICA LANCETS MISC Use as directed  . potassium chloride (K-DUR) 10 MEQ tablet TAKE 1 TABLET BY MOUTH DAILY AS NEEDED  . pravastatin (PRAVACHOL) 40 MG tablet TAKE ONE TABLET BY MOUTH AT BEDTIME  . timolol (BETIMOL) 0.5 % ophthalmic solution Place 1 drop into both eyes 2 (two) times daily.   No current facility-administered medications on file prior to visit.     Review of Systems Per HPI unless specifically indicated in ROS section     Objective:    BP 134/60 (BP Location: Left Arm, Patient Position: Sitting, Cuff Size: Normal)   Pulse 64   Temp 97.9 F (36.6 C) (Oral)   Wt 162 lb 8 oz (73.7 kg)   SpO2 99%   BMI 27.04 kg/m   Wt Readings from Last 3 Encounters:  10/24/15 162 lb 8 oz (73.7 kg)  10/03/15 166 lb 3.6 oz (75.4 kg)  09/09/15 166 lb 8 oz (75.5 kg)    Physical Exam  Constitutional: He appears well-developed and well-nourished. No distress.  HENT:  Mouth/Throat: Oropharynx is clear and moist.  No oropharyngeal exudate.  Cardiovascular: Normal heart sounds and intact distal pulses.  An irregularly irregular rhythm present. Bradycardia present.   No murmur heard. Pulmonary/Chest: Effort normal and breath sounds normal. No respiratory distress. He has no wheezes. He has no rales.  Skin: Skin is warm and dry. No rash noted.  Psychiatric: He has a normal mood and affect.  Nursing note and vitals reviewed.  Results for orders placed or performed during the hospital encounter of 10/15/15  CBC  Result Value Ref Range   WBC 5.6 3.8 - 10.6 K/uL   RBC 3.27 (L) 4.40 - 5.90 MIL/uL    Hemoglobin 10.0 (L) 13.0 - 18.0 g/dL   HCT 30.1 (L) 40.0 - 52.0 %   MCV 92.0 80.0 - 100.0 fL   MCH 30.6 26.0 - 34.0 pg   MCHC 33.2 32.0 - 36.0 g/dL   RDW 16.7 (H) 11.5 - 14.5 %   Platelets 82 (L) 150 - 440 K/uL  Differential  Result Value Ref Range   Neutrophils Relative % 72% %   Neutro Abs 4.0 1.4 - 6.5 K/uL   Lymphocytes Relative 14% %   Lymphs Abs 0.8 (L) 1.0 - 3.6 K/uL   Monocytes Relative 10% %   Monocytes Absolute 0.6 0.2 - 1.0 K/uL   Eosinophils Relative 4% %   Eosinophils Absolute 0.2 0 - 0.7 K/uL   Basophils Relative 0% %   Basophils Absolute 0.0 0 - 0.1 K/uL  APTT  Result Value Ref Range   aPTT 38 (H) 24 - 36 seconds  Protime-INR  Result Value Ref Range   Prothrombin Time 16.0 (H) 11.4 - 15.0 seconds   INR 1.27    Lab Results  Component Value Date   TSH 1.35 08/27/2015       Assessment & Plan:   Problem List Items Addressed This Visit    Atrial fibrillation with slow ventricular response ([place]) - Primary    New.  Discussed etiology and risks of atrial fibrillation. Discussed stroke risk vs bleeding risk on blood thinner. CHADSVASC2 score 4.  Pt has not been taking aspirin 12m.  Does not want to see cardiology. Agrees to trial of eliquis if he can afford - 2.531mbid sent to pharmacy. RTC 1 mo f/u visit.  EKG - atrial fibrillation with bradycardia, septal Q waves.       Relevant Medications   apixaban (ELIQUIS) 2.5 MG TABS tablet   Other Relevant Orders   EKG 12-Lead (Completed)   IgM monoclonal gammopathy of uncertain significance    Appreciate heme/onc care of patient. F/u scheduled next week with Dr CoMike Gip      Other Visit Diagnoses   None.      Follow up plan: Return in about 4 weeks (around 11/21/2015) for follow up visit.  JaRia BushMD

## 2015-10-24 NOTE — Assessment & Plan Note (Signed)
Appreciate heme/onc care of patient. F/u scheduled next week with Dr Mike Gip.

## 2015-10-24 NOTE — Patient Instructions (Signed)
Stop aspirin. Start eliquis 2.5mg  twice daily. Return in 1 month for follow up   Atrial Fibrillation Atrial fibrillation is a type of irregular or rapid heartbeat (arrhythmia). In atrial fibrillation, the heart quivers continuously in a chaotic pattern. This occurs when parts of the heart receive disorganized signals that make the heart unable to pump blood normally. This can increase the risk for stroke, heart failure, and other heart-related conditions. There are different types of atrial fibrillation, including:  Paroxysmal atrial fibrillation. This type starts suddenly, and it usually stops on its own shortly after it starts.  Persistent atrial fibrillation. This type often lasts longer than a week. It may stop on its own or with treatment.  Long-lasting persistent atrial fibrillation. This type lasts longer than 12 months.  Permanent atrial fibrillation. This type does not go away. Talk with your health care provider to learn about the type of atrial fibrillation that you have. CAUSES This condition is caused by some heart-related conditions or procedures, including:  A heart attack.  Coronary artery disease.  Heart failure.  Heart valve conditions.  High blood pressure.  Inflammation of the sac that surrounds the heart (pericarditis).  Heart surgery.  Certain heart rhythm disorders, such as Wolf-Parkinson-White syndrome. Other causes include:  Pneumonia.  Obstructive sleep apnea.  Blockage of an artery in the lungs (pulmonary embolism, or PE).  Lung cancer.  Chronic lung disease.  Thyroid problems, especially if the thyroid is overactive (hyperthyroidism).  Caffeine.  Excessive alcohol use or illegal drug use.  Use of some medicines, including certain decongestants and diet pills. Sometimes, the cause cannot be found. RISK FACTORS This condition is more likely to develop in:  People who are older in age.  People who smoke.  People who have diabetes  mellitus.  People who are overweight (obese).  Athletes who exercise vigorously. SYMPTOMS Symptoms of this condition include:  A feeling that your heart is beating rapidly or irregularly.  A feeling of discomfort or pain in your chest.  Shortness of breath.  Sudden light-headedness or weakness.  Getting tired easily during exercise. In some cases, there are no symptoms. DIAGNOSIS Your health care provider may be able to detect atrial fibrillation when taking your pulse. If detected, this condition may be diagnosed with:  An electrocardiogram (ECG).  A Holter monitor test that records your heartbeat patterns over a 24-hour period.  Transthoracic echocardiogram (TTE) to evaluate how blood flows through your heart.  Transesophageal echocardiogram (TEE) to view more detailed images of your heart.  A stress test.  Imaging tests, such as a CT scan or chest X-ray.  Blood tests. TREATMENT The main goals of treatment are to prevent blood clots from forming and to keep your heart beating at a normal rate and rhythm. The type of treatment that you receive depends on many factors, such as your underlying medical conditions and how you feel when you are experiencing atrial fibrillation. This condition may be treated with:  Medicine to slow down the heart rate, bring the heart's rhythm back to normal, or prevent clots from forming.  Electrical cardioversion. This is a procedure that resets your heart's rhythm by delivering a controlled, low-energy shock to the heart through your skin.  Different types of ablation, such as catheter ablation, catheter ablation with pacemaker, or surgical ablation. These procedures destroy the heart tissues that send abnormal signals. When the pacemaker is used, it is placed under your skin to help your heart beat in a regular rhythm. HOME CARE INSTRUCTIONS  Take over-the counter and prescription medicines only as told by your health care provider.  If  your health care provider prescribed a blood-thinning medicine (anticoagulant), take it exactly as told. Taking too much blood-thinning medicine can cause bleeding. If you do not take enough blood-thinning medicine, you will not have the protection that you need against stroke and other problems.  Do not use tobacco products, including cigarettes, chewing tobacco, and e-cigarettes. If you need help quitting, ask your health care provider.  If you have obstructive sleep apnea, manage your condition as told by your health care provider.  Do not drink alcohol.  Do not drink beverages that contain caffeine, such as coffee, soda, and tea.  Maintain a healthy weight. Do not use diet pills unless your health care provider approves. Diet pills may make heart problems worse.  Follow diet instructions as told by your health care provider.  Exercise regularly as told by your health care provider.  Keep all follow-up visits as told by your health care provider. This is important. PREVENTION  Avoid drinking beverages that contain caffeine or alcohol.  Avoid certain medicines, especially medicines that are used for breathing problems.  Avoid certain herbs and herbal medicines, such as those that contain ephedra or ginseng.  Do not use illegal drugs, such as cocaine and amphetamines.  Do not smoke.  Manage your high blood pressure. SEEK MEDICAL CARE IF:  You notice a change in the rate, rhythm, or strength of your heartbeat.  You are taking an anticoagulant and you notice increased bruising.  You tire more easily when you exercise or exert yourself. SEEK IMMEDIATE MEDICAL CARE IF:  You have chest pain, abdominal pain, sweating, or weakness.  You feel nauseous.  You notice blood in your vomit, bowel movement, or urine.  You have shortness of breath.  You suddenly have swollen feet and ankles.  You feel dizzy.  You have sudden weakness or numbness of the face, arm, or leg,  especially on one side of the body.  You have trouble speaking, trouble understanding, or both (aphasia).  Your face or your eyelid droops on one side. These symptoms may represent a serious problem that is an emergency. Do not wait to see if the symptoms will go away. Get medical help right away. Call your local emergency services (911 in the [place] Do not drive yourself to the hospital.   This information is not intended to replace advice given to you by your health care provider. Make sure you discuss any questions you have with your health care provider.   Document Released: 03/16/2005 Document Revised: 12/05/2014 Document Reviewed: 07/11/2014 Elsevier Interactive Patient Education [employer].

## 2015-10-24 NOTE — Telephone Encounter (Signed)
Eliquis required PA. Completed and approved via Cover My Meds. Pharmacy notified.

## 2015-10-24 NOTE — Progress Notes (Signed)
Pre visit review using our clinic review tool, if applicable. No additional management support is needed unless otherwise documented below in the visit note. 

## 2015-10-28 DIAGNOSIS — C44329 Squamous cell carcinoma of skin of other parts of face: Secondary | ICD-10-CM | POA: Diagnosis not present

## 2015-11-01 ENCOUNTER — Other Ambulatory Visit: Payer: Self-pay | Admitting: *Deleted

## 2015-11-01 ENCOUNTER — Other Ambulatory Visit: Payer: Self-pay

## 2015-11-01 ENCOUNTER — Inpatient Hospital Stay: Payer: Medicare Other | Attending: Hematology and Oncology | Admitting: Hematology and Oncology

## 2015-11-01 VITALS — BP 136/67 | HR 65 | Temp 98.6°F | Resp 18 | Wt 163.7 lb

## 2015-11-01 DIAGNOSIS — E1122 Type 2 diabetes mellitus with diabetic chronic kidney disease: Secondary | ICD-10-CM | POA: Diagnosis not present

## 2015-11-01 DIAGNOSIS — D696 Thrombocytopenia, unspecified: Secondary | ICD-10-CM | POA: Insufficient documentation

## 2015-11-01 DIAGNOSIS — Z79899 Other long term (current) drug therapy: Secondary | ICD-10-CM | POA: Diagnosis not present

## 2015-11-01 DIAGNOSIS — Z87891 Personal history of nicotine dependence: Secondary | ICD-10-CM | POA: Diagnosis not present

## 2015-11-01 DIAGNOSIS — R5383 Other fatigue: Secondary | ICD-10-CM | POA: Insufficient documentation

## 2015-11-01 DIAGNOSIS — N183 Chronic kidney disease, stage 3 (moderate): Secondary | ICD-10-CM | POA: Diagnosis not present

## 2015-11-01 DIAGNOSIS — D472 Monoclonal gammopathy: Secondary | ICD-10-CM | POA: Insufficient documentation

## 2015-11-01 DIAGNOSIS — Z800 Family history of malignant neoplasm of digestive organs: Secondary | ICD-10-CM | POA: Diagnosis not present

## 2015-11-01 DIAGNOSIS — I129 Hypertensive chronic kidney disease with stage 1 through stage 4 chronic kidney disease, or unspecified chronic kidney disease: Secondary | ICD-10-CM | POA: Diagnosis not present

## 2015-11-01 DIAGNOSIS — E785 Hyperlipidemia, unspecified: Secondary | ICD-10-CM | POA: Diagnosis not present

## 2015-11-01 DIAGNOSIS — D631 Anemia in chronic kidney disease: Secondary | ICD-10-CM | POA: Diagnosis not present

## 2015-11-01 DIAGNOSIS — J449 Chronic obstructive pulmonary disease, unspecified: Secondary | ICD-10-CM | POA: Insufficient documentation

## 2015-11-01 DIAGNOSIS — Z807 Family history of other malignant neoplasms of lymphoid, hematopoietic and related tissues: Secondary | ICD-10-CM | POA: Diagnosis not present

## 2015-11-01 DIAGNOSIS — N2581 Secondary hyperparathyroidism of renal origin: Secondary | ICD-10-CM | POA: Diagnosis not present

## 2015-11-01 DIAGNOSIS — D509 Iron deficiency anemia, unspecified: Secondary | ICD-10-CM

## 2015-11-01 DIAGNOSIS — Z7901 Long term (current) use of anticoagulants: Secondary | ICD-10-CM | POA: Diagnosis not present

## 2015-11-01 DIAGNOSIS — M8580 Other specified disorders of bone density and structure, unspecified site: Secondary | ICD-10-CM

## 2015-11-01 DIAGNOSIS — Z85820 Personal history of malignant melanoma of skin: Secondary | ICD-10-CM | POA: Diagnosis not present

## 2015-11-01 DIAGNOSIS — N189 Chronic kidney disease, unspecified: Secondary | ICD-10-CM

## 2015-11-01 MED ORDER — FERROUS SULFATE 325 (65 FE) MG PO TBEC: 325.0000 mg | DELAYED_RELEASE_TABLET | Freq: Every day | ORAL | 2 refills | Status: DC

## 2015-11-01 MED ORDER — FUROSEMIDE 20 MG PO TABS: 20.0000 mg | ORAL_TABLET | Freq: Every day | ORAL | 1 refills | Status: DC

## 2015-11-01 NOTE — Progress Notes (Signed)
Patient is here today for follow up, he is with his brother. No complaints today.

## 2015-11-01 NOTE — Telephone Encounter (Signed)
[place] left v/m requesting refill furosemide; pt no longer getting from kidney specialist. Last refill furosemide # 30 x 3 on hx med list by Dr Darnell Level on 09/10/11. Pt last seen 10/24/15.Please advise.

## 2015-11-01 NOTE — Telephone Encounter (Signed)
[place] left v/m requesting status of furosemide refill.

## 2015-11-01 NOTE — Progress Notes (Signed)
[facility] day:   11/01/2015  Chief Complaint: Charles Rodgers is an 82 y.o. male with anemia, thrombocytopenia, chronic renal insufficiency, and a monoclonal gammopathy who is seen for review of interval bone marrow and discussion regarding direction of therapy.  HPI: The patient was last seen in the medical oncology clinic on 10/03/2015.  At that time, outside labs revealed anemia and thrombocytopenia.  Hematocrit was 28.0, hemoglobin 9.2, MCV 88.8, platelets 75,000, WBC 5900 with an ANC of 4400.  Creatinine was 2.06 (1.61 on 12/21/2014).  Calcium was 8.9.  Albumen was 3.6.  We discuss a bone marrow aspirate and biopsy.  Bone survey on 10/03/2015 revealed no definite radiolucent lesions. There was osteopenia diffusely, but no definite lesion is evident.  There was no active lung disease.  There were multiple old healed right lateral rib fractures.  He underwent bone marrow aspirate and biopsy on 10/15/2015.  Bone marrow revealed no significant dysplasia or abnormal or overt neoplasia. Marrow was slightly hypercellular for age (40%) with maturing trilineage hematopoiesis, relatively diminished erythroid precursors, and adequate megakaryocytes. There was marginally increased polytypic plasma cells (5%). There was diffuse mild increase in reticulin. There were trace iron stores.  Flow cytometry revealed a low level monoclonal B population (1%) with nonspecific immunophenotype.  Based on the marrow findings, the patient's monoclonal gammopathy is best classified as monoclonal gammopathy of unknown significance (MGUS).   Cytopenias are likely multifactorial, including renal insufficiency, anemia of chronic disease, and drug/medication effect. The possibility of a low-grade myelodysplastic process cannot be entirely excluded  Symptomatically, he denies any complaint.   Past Medical History:  Diagnosis Date  . Arthritis   . BPH (benign prostatic  hypertrophy)    with elevated PSA, s/p benign biopsy, followed [facility] (last seen 01/16/2013)  . CKD (chronic kidney disease) stage 3, GFR 30-59 ml/min    [place] -> 2017 requested PCP start following  . COPD (chronic obstructive pulmonary disease) ([place]) 2016   by xray  . Diabetes mellitus type 2, controlled ([place])   . Glaucoma   . Hearing loss   . HLD (hyperlipidemia)   . HTN (hypertension)   . IgM monoclonal gammopathy of uncertain significance 08/27/2014   07/2014 suggest rpt 6 mo.  . Malignant melanoma of left upper arm ([place]) 11/14/2014   s/p excision complicated by infected axillary seroma (Dr Carmin Muskrat)  . Melanoma ([place]) 2014   nose and left hand  . Secondary hyperparathyroidism ([place])   . Subclavian artery stenosis, right 2014   found on carotid US, seen [facility], recommended recheck 1 yr at vascular lab with Dr. Delana Meyer  . Thyroid nodule 02/2013   right upper lobe - s/p biopsy - benign follicular nodule  . Urine incontinence     Past Surgical History:  Procedure Laterality Date  . APPENDECTOMY  1938  . arm surgery     Right after trauma  . BIOPSY THYROID Right [id number]/[id number]   benign follicular nodule  . carotid US  07/2007   WNL  . 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 US ECHOCARDIOGRAPHY  06/2007   EF 55%, mod dil LA, mild LVH,mild pulm HTN    Family History  Problem Relation Age of Onset  . Cancer Mother     stomach  . Cancer Brother     colon  . Cancer Brother     colon  . Cancer Sister  hodgkin's lymphoma  . Cancer Brother     prostate  . Diabetes Brother   . Coronary artery disease Neg Hx   . Stroke Neg Hx     Social History:  reports that he quit smoking about 48 years ago. His smoking use included Cigarettes. He has a 7.50 pack-year smoking history. He has never used smokeless tobacco. He reports that he does not drink alcohol or use drugs.  The patient is accompanied by his son.  Allergies: No Known  Allergies  Current Medications: Current Outpatient Prescriptions  Medication Sig Dispense Refill  . amLODipine (NORVASC) 5 MG tablet TAKE 1 TABLET BY MOUTH ONCE A DAY 90 tablet 1  . apixaban (ELIQUIS) 2.5 MG TABS tablet Take 1 tablet (2.5 mg total) by mouth 2 (two) times daily. 60 tablet 3  . Blood Glucose Monitoring Suppl (ONE TOUCH ULTRA 2) W/DEVICE KIT     . brimonidine (ALPHAGAN) 0.2 % ophthalmic solution Place 1 drop into both eyes 2 (two) times daily.    . Calcium Carbonate-Vitamin D (CALTRATE 600+D PO) Take by mouth daily.    Sarajane Marek Sodium 30-100 MG CAPS Take by mouth daily.    . dorzolamide-timolol (COSOPT) 22.3-6.8 MG/ML ophthalmic solution     . doxazosin (CARDURA) 8 MG tablet TAKE ONE TABLET BY MOUTH AT BEDTIME 90 tablet 3  . fish oil-omega-3 fatty acids 1000 MG capsule Take 2 g by mouth daily.    Marland Kitchen latanoprost (XALATAN) 0.005 % ophthalmic solution     . lisinopril-hydrochlorothiazide (PRINZIDE,ZESTORETIC) 20-25 MG tablet TAKE ONE TABLET BY MOUTH EVERY DAY 90 tablet 3  . Multiple Vitamins-Minerals (PRESERVISION AREDS) CAPS Take 2 capsules by mouth daily.    . ONE TOUCH ULTRA TEST test strip USE TO CHECK BLOOD SUGAR 3 TIMES DAILY 100 each 2  . ONETOUCH DELICA LANCETS MISC Use as directed    . potassium chloride (K-DUR) 10 MEQ tablet TAKE 1 TABLET BY MOUTH DAILY AS NEEDED 30 tablet 3  . pravastatin (PRAVACHOL) 40 MG tablet TAKE ONE TABLET BY MOUTH AT BEDTIME 90 tablet 3  . timolol (BETIMOL) 0.5 % ophthalmic solution Place 1 drop into both eyes 2 (two) times daily.    . ferrous sulfate 325 (65 FE) MG EC tablet Take 1 tablet (325 mg total) by mouth daily with breakfast. 30 tablet 2  . furosemide (LASIX) 20 MG tablet Take 1 tablet (20 mg total) by mouth daily. 30 tablet 1   No current facility-administered medications for this visit.     Review of Systems:  GENERAL:  Feels good except for fatigue.  No fevers or sweats .  Weight down 5 pounds. PERFORMANCE STATUS  (ECOG):  1 HEENT:  Hard of hearing.  No visual changes, runny nose, sore throat, mouth sores or tenderness. Lungs: No shortness of breath or cough.  No hemoptysis. Cardiac:  No chest pain, palpitations, orthopnea, or PND. GI:  No nausea, vomiting, diarrhea, constipation, melena or hematochezia. GU:  No urgency, frequency, dysuria, or hematuria. Musculoskeletal:  No back pain.  Arthritis pain.  No muscle tenderness. Extremities:  No pain or swelling. Skin:  Melanoma s/p removal.  No rashes or skin changes. Neuro:  No headache, numbness or weakness, balance or coordination issues. Endocrine:  No diabetes, thyroid issues, hot flashes or night sweats. Psych:  No mood changes, depression or anxiety. Pain:  No focal pain. Review of systems:  All other systems reviewed and found to be negative.   Physical Exam: Blood pressure 136/67, pulse 65,  temperature 98.6 F (37 C), temperature source Tympanic, resp. rate 18, weight 163 lb 11.1 oz (74.2 kg). GENERAL:  Well developed, well nourished, elderly gentleman sitting comfortably in the exam room in no acute distress. MENTAL STATUS:  Alert and oriented to person, place and time. HEAD:  Wearing a cap.  White hair.  Male pattern baldness.  Normocephalic, atraumatic, face symmetric, no Cushingoid features. EYES:  Glasses.  Hazel eyes.  Pupils equal round and reactive to light and accomodation.  No conjunctivitis or scleral icterus. ENT:  Hearing aides.  Oropharynx clear without lesion.  Tongue normal. Mucous membranes moist.  RESPIRATORY:  Clear to auscultation without rales, wheezes or rhonchi. CARDIOVASCULAR:  Regular rate and rhythm without murmur, rub or gallop. ABDOMEN:  Ventral hernia.  Soft, non-tender, with active bowel sounds, and no hepatosplenomegaly.  No masses. SKIN:  Changes due to sun exposure.  No rashes, ulcers or lesions. EXTREMITIES: Trace ankle edema.  No skin discoloration or tenderness.  No palpable cords. LYMPH NODES: No palpable  cervical, supraclavicular, axillary or inguinal adenopathy  NEUROLOGICAL: Unremarkable. PSYCH:  Appropriate.   No visits with results within 3 Day(s) from this visit.  Latest known visit with results is:  Hospital Outpatient Visit on 10/15/2015  Component Date Value Ref Range Status  . WBC 10/15/2015 5.6  3.8 - 10.6 K/uL Final  . RBC 10/15/2015 3.27* 4.40 - 5.90 MIL/uL Final  . Hemoglobin 10/15/2015 10.0* 13.0 - 18.0 g/dL Final  . HCT 10/15/2015 30.1* 40.0 - 52.0 % Final  . MCV 10/15/2015 92.0  80.0 - 100.0 fL Final  . MCH 10/15/2015 30.6  26.0 - 34.0 pg Final  . MCHC 10/15/2015 33.2  32.0 - 36.0 g/dL Final  . RDW 10/15/2015 16.7* 11.5 - 14.5 % Final  . Platelets 10/15/2015 82* 150 - 440 K/uL Final  . Neutrophils Relative % 10/15/2015 72%  % Final  . Neutro Abs 10/15/2015 4.0  1.4 - 6.5 K/uL Final  . Lymphocytes Relative 10/15/2015 14%  % Final  . Lymphs Abs 10/15/2015 0.8* 1.0 - 3.6 K/uL Final  . Monocytes Relative 10/15/2015 10%  % Final  . Monocytes Absolute 10/15/2015 0.6  0.2 - 1.0 K/uL Final  . Eosinophils Relative 10/15/2015 4%  % Final  . Eosinophils Absolute 10/15/2015 0.2  0 - 0.7 K/uL Final  . Basophils Relative 10/15/2015 0%  % Final  . Basophils Absolute 10/15/2015 0.0  0 - 0.1 K/uL Final  . aPTT 10/15/2015 38* 24 - 36 seconds Final   Comment:        IF BASELINE aPTT IS ELEVATED, SUGGEST PATIENT RISK ASSESSMENT BE USED TO DETERMINE APPROPRIATE ANTICOAGULANT THERAPY.   . Prothrombin Time 10/15/2015 16.0* 11.4 - 15.0 seconds Final  . INR 10/15/2015 1.27   Final    Assessment:  Charles Rodgers is an 82 y.o. male with mild cytopenias likely multi-factorial in nature (renal insufficiency and anemia of chronic disease).  He has a 2 year history of a normocytic anemia, thrombocytopenia and a normal white count.    He has a monoclonal gammopathy of unknown significance (MGUS).  Serum protein electrophoresis (SPEP) was 0.3 gm/dL on 08/15/2014 and 0.4 gm.dL on  09/11/2014. Kappa free light chains were 147.45, lambda free light chains 32.32, with a kappa lambda ratio of 4.56 (0.26-1.65).  Immunoglobulin levels were normal.  24-hour urine revealed kappa free light chains of 30.6, lambda free light chains 2.25 and a ratio 13.6 (2.04-10.37). 24 hour urine protein electrophoresis revealed no monoclonal protein.  He has chronic renal insufficiency (Cr 1.7-2.03) over the past 2 years without trend.  He denies any new medications or herbal products.  Peripheral smear on 08/27/2014 revealed no abnormal white blood cells, thrombocytopenia without clumping and some large platelets. There were elliptocytes, teardrop red blood cells, and schistocytes. Teardrop RBCs and elliptocytes suggest possible extramedullary hematopoiesis (myelofibrosis).  Myelodysplastic syndrome with deletion 20q is a consideration.  Work-up on 09/11/2014 included CBC with a hematocrit 30.4, hemoglobin 9.7, MCV 95.1, platelets 80,000, white count 5300 with an Bee of 3800. Differential was unremarkable.  B12, folate, TSH, ANA, PT, PTT, LDH.  Reticulocyte count was low at 3.19%. Ferritin was 47 with a iron saturation of 20% and a TIBC of 265 (normal).  Bone survey on 10/03/2014 revealed subtle lucencies within the calvarium, left femoral head, and proximal left tibia which could reflect myelomatous involvement.  Bone survey on 10/03/2015 revealed no definite radiolucent lesions. There was diffuse osteopenia.  There were multiple old healed right lateral rib fractures.  Bone marrow aspirate and biopsy on 10/15/2015 revealed no significant dysplasia or abnormal or overt neoplasia. Marrow was slightly hypercellular for age (40%) with maturing trilineage hematopoiesis, relatively diminished erythroid precursors, and adequate megakaryocytes. There was marginally increased polytypic plasma cells (5%). There was diffuse mild increase in reticulin. There were trace iron stores.  Flow cytometry revealed a low  level monoclonal B population (1%) with nonspecific immunophenotype.  He rarely eats meat.  He denies any melena or hematochezia.  He has never had a colonoscopy.  Guaiac cards were negative 2 on 09/16/2014.   Symptomatically, he denies any complaint.  Exam reveals no adenopathy or hepatosplenomegaly.  Plan: 1.  Review bone survey and bone marrow.  Etiology of cytopenias is multi-factorial.  No evidence of malignancy.  Discuss renal insufficiency and associated anemia.  Discuss potential use of Procrit in future if hemoglobin < 10 and hematocrit < 30.  Likely monoclonal gammopathy of unknown significance (MGUS).   2.  Follow-up pending cytogenetics. 3.  Encourage patient to follow-up with nephrology. 4.  Begin ferrous sulfate 325 mg po q day with OJ or vitamin C secondary to limited iron staining in marrow. 5.  RTC in 2 months for MD assessment and labs (CBC with diff, BMP, ferritin, epo level).   Lequita Asal, MD  11/01/2015, 2:47 PM

## 2015-11-03 ENCOUNTER — Encounter: Payer: Self-pay | Admitting: Hematology and Oncology

## 2015-11-15 DIAGNOSIS — D631 Anemia in chronic kidney disease: Secondary | ICD-10-CM | POA: Diagnosis not present

## 2015-11-15 DIAGNOSIS — N2581 Secondary hyperparathyroidism of renal origin: Secondary | ICD-10-CM | POA: Diagnosis not present

## 2015-11-15 DIAGNOSIS — N183 Chronic kidney disease, stage 3 (moderate): Secondary | ICD-10-CM | POA: Diagnosis not present

## 2015-11-15 DIAGNOSIS — E1122 Type 2 diabetes mellitus with diabetic chronic kidney disease: Secondary | ICD-10-CM | POA: Diagnosis not present

## 2015-11-15 DIAGNOSIS — I129 Hypertensive chronic kidney disease with stage 1 through stage 4 chronic kidney disease, or unspecified chronic kidney disease: Secondary | ICD-10-CM | POA: Diagnosis not present

## 2015-11-20 DIAGNOSIS — L570 Actinic keratosis: Secondary | ICD-10-CM | POA: Diagnosis not present

## 2015-11-20 DIAGNOSIS — C4441 Basal cell carcinoma of skin of scalp and neck: Secondary | ICD-10-CM | POA: Diagnosis not present

## 2015-11-21 ENCOUNTER — Encounter: Payer: Self-pay | Admitting: Family Medicine

## 2015-11-21 ENCOUNTER — Ambulatory Visit (INDEPENDENT_AMBULATORY_CARE_PROVIDER_SITE_OTHER): Payer: Medicare Other | Admitting: Family Medicine

## 2015-11-21 ENCOUNTER — Other Ambulatory Visit: Payer: Self-pay | Admitting: *Deleted

## 2015-11-21 DIAGNOSIS — I4891 Unspecified atrial fibrillation: Secondary | ICD-10-CM

## 2015-11-21 DIAGNOSIS — D472 Monoclonal gammopathy: Secondary | ICD-10-CM | POA: Diagnosis not present

## 2015-11-21 DIAGNOSIS — N183 Chronic kidney disease, stage 3 unspecified: Secondary | ICD-10-CM

## 2015-11-21 MED ORDER — GLUCOSE BLOOD VI STRP: ORAL_STRIP | 2 refills | Status: DC

## 2015-11-21 NOTE — Patient Instructions (Addendum)
Continue eliquis.  You are doing well today. Return in 3 months for follow up visit.

## 2015-11-21 NOTE — Progress Notes (Signed)
Pre visit review using our clinic review tool, if applicable. No additional management support is needed unless otherwise documented below in the visit note. 

## 2015-11-21 NOTE — Progress Notes (Signed)
BP 118/78   Pulse 68   Temp 98.1 F (36.7 C) (Oral)   Wt 163 lb 8 oz (74.2 kg)   BMI 27.21 kg/m    CC: 1 mo f/u visit Subjective:    Patient ID: Charles Rodgers, male    DOB: 09-Oct-1924, 82 y.o.   MRN: [id number]  HPI: Delquan Poucher is a 82 y.o. male presenting on 11/21/2015 for Follow-up   See prior note for details. Here with brother today.   Recent dx atrial fibrillation - pt declined cardiology referral. Last visit we started eliquis 2.69m bid.   Had scalp surgery yesterday by dermatology Dr WCletus Gash   Relevant past medical, surgical, family and social history reviewed and updated as indicated. Interim medical history since our last visit reviewed. Allergies and medications reviewed and updated. Current Outpatient Prescriptions on File Prior to Visit  Medication Sig  . amLODipine (NORVASC) 5 MG tablet TAKE 1 TABLET BY MOUTH ONCE A DAY  . apixaban (ELIQUIS) 2.5 MG TABS tablet Take 1 tablet (2.5 mg total) by mouth 2 (two) times daily.  . Blood Glucose Monitoring Suppl (ONE TOUCH ULTRA 2) W/DEVICE KIT   . Casanthranol-Docusate Sodium 30-100 MG CAPS Take by mouth daily.  .Marland Kitchendoxazosin (CARDURA) 8 MG tablet TAKE ONE TABLET BY MOUTH AT BEDTIME  . ferrous sulfate 325 (65 FE) MG EC tablet Take 1 tablet (325 mg total) by mouth daily with breakfast.  . furosemide (LASIX) 20 MG tablet Take 1 tablet (20 mg total) by mouth daily.  .Marland Kitchenlisinopril-hydrochlorothiazide (PRINZIDE,ZESTORETIC) 20-25 MG tablet TAKE ONE TABLET BY MOUTH EVERY DAY  . ONE TOUCH ULTRA TEST test strip USE TO CHECK BLOOD SUGAR 3 TIMES DAILY  . ONETOUCH DELICA LANCETS MISC Use as directed  . pravastatin (PRAVACHOL) 40 MG tablet TAKE ONE TABLET BY MOUTH AT BEDTIME  . brimonidine (ALPHAGAN) 0.2 % ophthalmic solution Place 1 drop into both eyes 2 (two) times daily.  . dorzolamide-timolol (COSOPT) 22.3-6.8 MG/ML ophthalmic solution   . latanoprost (XALATAN) 0.005 % ophthalmic solution   . Multiple  Vitamins-Minerals (PRESERVISION AREDS) CAPS Take 2 capsules by mouth daily.  . potassium chloride (K-DUR) 10 MEQ tablet TAKE 1 TABLET BY MOUTH DAILY AS NEEDED (Patient not taking: Reported on 11/21/2015)  . timolol (BETIMOL) 0.5 % ophthalmic solution Place 1 drop into both eyes 2 (two) times daily.   No current facility-administered medications on file prior to visit.     Review of Systems Per HPI unless specifically indicated in ROS section     Objective:    BP 118/78   Pulse 68   Temp 98.1 F (36.7 C) (Oral)   Wt 163 lb 8 oz (74.2 kg)   BMI 27.21 kg/m   Wt Readings from Last 3 Encounters:  11/21/15 163 lb 8 oz (74.2 kg)  11/01/15 163 lb 11.1 oz (74.2 kg)  10/24/15 162 lb 8 oz (73.7 kg)    Physical Exam  Constitutional: He appears well-developed and well-nourished. No distress.  HENT:  Mouth/Throat: Oropharynx is clear and moist. No oropharyngeal exudate.  Cardiovascular: Normal rate, normal heart sounds and intact distal pulses.  An irregular rhythm present.  No murmur heard. Pulmonary/Chest: Effort normal and breath sounds normal. No respiratory distress. He has no wheezes. He has no rales.  Musculoskeletal: He exhibits no edema.  Psychiatric: He has a normal mood and affect.  Nursing note and vitals reviewed.  Results for orders placed or performed during the hospital encounter of 10/15/15  CBC  Result Value Ref Range   WBC 5.6 3.8 - 10.6 K/uL   RBC 3.27 (L) 4.40 - 5.90 MIL/uL   Hemoglobin 10.0 (L) 13.0 - 18.0 g/dL   HCT 30.1 (L) 40.0 - 52.0 %   MCV 92.0 80.0 - 100.0 fL   MCH 30.6 26.0 - 34.0 pg   MCHC 33.2 32.0 - 36.0 g/dL   RDW 16.7 (H) 11.5 - 14.5 %   Platelets 82 (L) 150 - 440 K/uL  Differential  Result Value Ref Range   Neutrophils Relative % 72% %   Neutro Abs 4.0 1.4 - 6.5 K/uL   Lymphocytes Relative 14% %   Lymphs Abs 0.8 (L) 1.0 - 3.6 K/uL   Monocytes Relative 10% %   Monocytes Absolute 0.6 0.2 - 1.0 K/uL   Eosinophils Relative 4% %   Eosinophils  Absolute 0.2 0 - 0.7 K/uL   Basophils Relative 0% %   Basophils Absolute 0.0 0 - 0.1 K/uL  APTT  Result Value Ref Range   aPTT 38 (H) 24 - 36 seconds  Protime-INR  Result Value Ref Range   Prothrombin Time 16.0 (H) 11.4 - 15.0 seconds   INR 1.27       Assessment & Plan:  Over 25 minutes were spent face-to-face with the patient during this encounter and >50% of that time was spent on counseling and coordination of care  Problem List Items Addressed This Visit    Atrial fibrillation with slow ventricular response (HCC)    Stable on current regimen including eliquis.  Discussed pros/cons of eliquis.  Reviewed stroke prevention, reviewed increased bleeding risk.  Pt decides to continue eliquis for now, reassess next visit. $45/month price.      CKD (chronic kidney disease) stage 3, GFR 30-59 ml/min    Did return to see Dr Juleen China, good report.  Has previously asked I follow CKD. Vit D 50,000 IU monthly started.       IgM monoclonal gammopathy of uncertain significance    Appreciate onc care.       Other Visit Diagnoses   None.      Follow up plan: Return in about 3 months (around 02/21/2016) for follow up visit.  [name], MD

## 2015-11-21 NOTE — Assessment & Plan Note (Addendum)
Did return to see Dr Juleen China, good report.  Has previously asked I follow CKD. Vit D 50,000 IU monthly started.

## 2015-11-21 NOTE — Assessment & Plan Note (Signed)
Appreciate onc care.  

## 2015-11-21 NOTE — Assessment & Plan Note (Signed)
Stable on current regimen including eliquis.  Discussed pros/cons of eliquis.  Reviewed stroke prevention, reviewed increased bleeding risk.  Pt decides to continue eliquis for now, reassess next visit. $45/month price.

## 2015-12-11 DIAGNOSIS — C4362 Malignant melanoma of left upper limb, including shoulder: Secondary | ICD-10-CM | POA: Diagnosis not present

## 2015-12-18 ENCOUNTER — Ambulatory Visit (INDEPENDENT_AMBULATORY_CARE_PROVIDER_SITE_OTHER): Payer: Medicare Other

## 2015-12-18 DIAGNOSIS — Z23 Encounter for immunization: Secondary | ICD-10-CM | POA: Diagnosis not present

## 2015-12-27 ENCOUNTER — Other Ambulatory Visit: Payer: Self-pay | Admitting: Family Medicine

## 2016-01-03 ENCOUNTER — Inpatient Hospital Stay: Payer: Medicare Other | Attending: Hematology and Oncology

## 2016-01-03 ENCOUNTER — Inpatient Hospital Stay: Payer: Medicare Other | Admitting: Hematology and Oncology

## 2016-01-03 NOTE — Progress Notes (Deleted)
[facility] day:   11/01/2015  Chief Complaint: Charles Rodgers is an 82 y.o. male with anemia, thrombocytopenia, chronic renal insufficiency, and a monoclonal gammopathy who is seen for review of interval bone marrow and discussion regarding direction of therapy.  HPI: The patient was last seen in the medical oncology clinic on 11/01/2015.  At that time, interval bone marrow was reviewed.  Bone marrow revealed no significant dysplasia or abnormal or overt neoplasia. There was marginally increased polytypic plasma cells (5%).  Cytogenetics were pending.    Bone survey on 10/03/2015 revealed no definite radiolucent lesions. There was diffuse osteopenia.  There were multiple old healed right lateral rib fractures.  He was felt to have a monoclonal gammopathy of unknown significance (MGUS).   Cytopenias were likely multifactorial, including renal insufficiency, anemia of chronic disease, and drug/medication effect. The possibility of a low-grade myelodysplastic process was not entirely excluded.  He was encouraged to begin oral iron.    Symptomatically, he denies any complaint.   Past Medical History:  Diagnosis Date  . Arthritis   . BPH (benign prostatic hypertrophy)    with elevated PSA, s/p benign biopsy, followed yearly by [facility] (last seen 01/16/2013)  . CKD (chronic kidney disease) stage 3, GFR 30-59 ml/min    [place] -> 2017 requested PCP start following  . COPD (chronic obstructive pulmonary disease) ([place]) 2016   by xray  . Diabetes mellitus type 2, controlled ([place])   . Glaucoma   . Hearing loss   . HLD (hyperlipidemia)   . HTN (hypertension)   . IgM monoclonal gammopathy of uncertain significance 08/27/2014   07/2014 suggest rpt 6 mo.  . Malignant melanoma of left upper arm ([place]) 11/14/2014   s/p excision complicated by infected axillary seroma (Dr Carmin Muskrat)  . Melanoma ([place]) 2014   nose and left hand  . Secondary  hyperparathyroidism ([place])   . Subclavian artery stenosis, right ([place]) 2014   found on carotid US, seen [facility], recommended recheck 1 yr at vascular lab with Dr. Delana Meyer  . Thyroid nodule 02/2013   right upper lobe - s/p biopsy - benign follicular nodule  . Urine incontinence     Past Surgical History:  Procedure Laterality Date  . APPENDECTOMY  1938  . arm surgery     Right after trauma  . BIOPSY THYROID Right 79/0240   benign follicular nodule  . carotid US  07/2007   WNL  . 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 US ECHOCARDIOGRAPHY  06/2007   EF 55%, mod dil LA, mild LVH,mild pulm HTN    Family History  Problem Relation Age of Onset  . Cancer Mother     stomach  . Cancer Brother     colon  . Cancer Brother     colon  . Cancer Sister     hodgkin's lymphoma  . Cancer Brother     prostate  . Diabetes Brother   . Coronary artery disease Neg Hx   . Stroke Neg Hx     Social History:  reports that he quit smoking about 48 years ago. His smoking use included Cigarettes. He has a 7.50 pack-year smoking history. He has never used smokeless tobacco. He reports that he does not drink alcohol or use drugs.  The patient is accompanied by his son.  Allergies: No Known Allergies  Current Medications: Current Outpatient Prescriptions  Medication Sig Dispense Refill  .  amLODipine (NORVASC) 5 MG tablet TAKE 1 TABLET BY MOUTH ONCE A DAY 90 tablet 1  . apixaban (ELIQUIS) 2.5 MG TABS tablet Take 1 tablet (2.5 mg total) by mouth 2 (two) times daily. 60 tablet 3  . Blood Glucose Monitoring Suppl (ONE TOUCH ULTRA 2) W/DEVICE KIT     . brimonidine (ALPHAGAN) 0.2 % ophthalmic solution Place 1 drop into both eyes 2 (two) times daily.    Sarajane Marek Sodium 30-100 MG CAPS Take by mouth daily.    . dorzolamide-timolol (COSOPT) 22.3-6.8 MG/ML ophthalmic solution     . doxazosin (CARDURA) 8 MG tablet TAKE ONE TABLET BY MOUTH AT BEDTIME 90  tablet 3  . ferrous sulfate 325 (65 FE) MG EC tablet Take 1 tablet (325 mg total) by mouth daily with breakfast. 30 tablet 2  . furosemide (LASIX) 20 MG tablet TAKE 1 TABLET BY MOUTH DAILY 30 tablet 3  . glucose blood (ONE TOUCH ULTRA TEST) test strip USE TO CHECK BLOOD SUGAR 3 TIMES DAILY Dx: E11.42 300 each 2  . latanoprost (XALATAN) 0.005 % ophthalmic solution     . lisinopril-hydrochlorothiazide (PRINZIDE,ZESTORETIC) 20-25 MG tablet TAKE ONE TABLET BY MOUTH EVERY DAY 90 tablet 3  . Multiple Vitamins-Minerals (PRESERVISION AREDS) CAPS Take 2 capsules by mouth daily.    Glory Rosebush DELICA LANCETS MISC Use as directed    . potassium chloride (K-DUR) 10 MEQ tablet TAKE 1 TABLET BY MOUTH DAILY AS NEEDED (Patient not taking: Reported on 11/21/2015) 30 tablet 3  . pravastatin (PRAVACHOL) 40 MG tablet TAKE ONE TABLET BY MOUTH AT BEDTIME 90 tablet 3  . timolol (BETIMOL) 0.5 % ophthalmic solution Place 1 drop into both eyes 2 (two) times daily.    . Vitamin D, Ergocalciferol, (DRISDOL) 50000 units CAPS capsule Take 50,000 Units by mouth every 30 (thirty) days.     No current facility-administered medications for this visit.     Review of Systems:  GENERAL:  Feels good except for fatigue.  No fevers or sweats .  Weight down 5 pounds. PERFORMANCE STATUS (ECOG):  1 HEENT:  Hard of hearing.  No visual changes, runny nose, sore throat, mouth sores or tenderness. Lungs: No shortness of breath or cough.  No hemoptysis. Cardiac:  No chest pain, palpitations, orthopnea, or PND. GI:  No nausea, vomiting, diarrhea, constipation, melena or hematochezia. GU:  No urgency, frequency, dysuria, or hematuria. Musculoskeletal:  No back pain.  Arthritis pain.  No muscle tenderness. Extremities:  No pain or swelling. Skin:  Melanoma s/p removal.  No rashes or skin changes. Neuro:  No headache, numbness or weakness, balance or coordination issues. Endocrine:  No diabetes, thyroid issues, hot flashes or night  sweats. Psych:  No mood changes, depression or anxiety. Pain:  No focal pain. Review of systems:  All other systems reviewed and found to be negative.   Physical Exam: There were no vitals taken for this visit. GENERAL:  Well developed, well nourished, elderly gentleman sitting comfortably in the exam room in no acute distress. MENTAL STATUS:  Alert and oriented to person, place and time. HEAD:  Wearing a cap.  White hair.  Male pattern baldness.  Normocephalic, atraumatic, face symmetric, no Cushingoid features. EYES:  Glasses.  Hazel eyes.  Pupils equal round and reactive to light and accomodation.  No conjunctivitis or scleral icterus. ENT:  Hearing aides.  Oropharynx clear without lesion.  Tongue normal. Mucous membranes moist.  RESPIRATORY:  Clear to auscultation without rales, wheezes or rhonchi. CARDIOVASCULAR:  Regular  rate and rhythm without murmur, rub or gallop. ABDOMEN:  Ventral hernia.  Soft, non-tender, with active bowel sounds, and no hepatosplenomegaly.  No masses. SKIN:  Changes due to sun exposure.  No rashes, ulcers or lesions. EXTREMITIES: Trace ankle edema.  No skin discoloration or tenderness.  No palpable cords. LYMPH NODES: No palpable cervical, supraclavicular, axillary or inguinal adenopathy  NEUROLOGICAL: Unremarkable. PSYCH:  Appropriate.   No visits with results within 3 Day(s) from this visit.  Latest known visit with results is:  Hospital Outpatient Visit on 10/15/2015  Component Date Value Ref Range Status  . WBC 10/15/2015 5.6  3.8 - 10.6 K/uL Final  . RBC 10/15/2015 3.27* 4.40 - 5.90 MIL/uL Final  . Hemoglobin 10/15/2015 10.0* 13.0 - 18.0 g/dL Final  . HCT 10/15/2015 30.1* 40.0 - 52.0 % Final  . MCV 10/15/2015 92.0  80.0 - 100.0 fL Final  . MCH 10/15/2015 30.6  26.0 - 34.0 pg Final  . MCHC 10/15/2015 33.2  32.0 - 36.0 g/dL Final  . RDW 10/15/2015 16.7* 11.5 - 14.5 % Final  . Platelets 10/15/2015 82* 150 - 440 K/uL Final  . Neutrophils Relative %  10/15/2015 72%  % Final  . Neutro Abs 10/15/2015 4.0  1.4 - 6.5 K/uL Final  . Lymphocytes Relative 10/15/2015 14%  % Final  . Lymphs Abs 10/15/2015 0.8* 1.0 - 3.6 K/uL Final  . Monocytes Relative 10/15/2015 10%  % Final  . Monocytes Absolute 10/15/2015 0.6  0.2 - 1.0 K/uL Final  . Eosinophils Relative 10/15/2015 4%  % Final  . Eosinophils Absolute 10/15/2015 0.2  0 - 0.7 K/uL Final  . Basophils Relative 10/15/2015 0%  % Final  . Basophils Absolute 10/15/2015 0.0  0 - 0.1 K/uL Final  . aPTT 10/15/2015 38* 24 - 36 seconds Final   Comment:        IF BASELINE aPTT IS ELEVATED, SUGGEST PATIENT RISK ASSESSMENT BE USED TO DETERMINE APPROPRIATE ANTICOAGULANT THERAPY.   . Prothrombin Time 10/15/2015 16.0* 11.4 - 15.0 seconds Final  . INR 10/15/2015 1.27   Final    Assessment:  Charles Rodgers is an 82 y.o. male with mild cytopenias likely multi-factorial in nature (renal insufficiency and anemia of chronic disease).  He has a 2 year history of a normocytic anemia, thrombocytopenia and a normal white count.    He has a monoclonal gammopathy of unknown significance (MGUS).  Serum protein electrophoresis (SPEP) was 0.3 gm/dL on 08/15/2014 and 0.4 gm.dL on 09/11/2014. Kappa free light chains were 147.45, lambda free light chains 32.32, with a kappa lambda ratio of 4.56 (0.26-1.65).  Immunoglobulin levels were normal.  24-hour urine revealed kappa free light chains of 30.6, lambda free light chains 2.25 and a ratio 13.6 (2.04-10.37). 24 hour urine protein electrophoresis revealed no monoclonal protein.  He has chronic renal insufficiency (Cr 1.7-2.03) over the past 2 years without trend.  He denies any new medications or herbal products.  Peripheral smear on 08/27/2014 revealed no abnormal white blood cells, thrombocytopenia without clumping and some large platelets. There were elliptocytes, teardrop red blood cells, and schistocytes. Teardrop RBCs and elliptocytes suggest possible extramedullary  hematopoiesis (myelofibrosis).  Myelodysplastic syndrome with deletion 20q is a consideration.  Work-up on 09/11/2014 included CBC with a hematocrit 30.4, hemoglobin 9.7, MCV 95.1, platelets 80,000, white count 5300 with an [place] of 3800. Differential was unremarkable.  B12, folate, TSH, ANA, PT, PTT, LDH.  Reticulocyte count was low at 3.19%. Ferritin was 47 with a iron saturation of  20% and a TIBC of 265 (normal).  Bone survey on 10/03/2014 revealed subtle lucencies within the calvarium, left femoral head, and proximal left tibia which could reflect myelomatous involvement.  Bone survey on 10/03/2015 revealed no definite radiolucent lesions. There was diffuse osteopenia.  There were multiple old healed right lateral rib fractures.  Bone marrow aspirate and biopsy on 10/15/2015 revealed no significant dysplasia or abnormal or overt neoplasia. Marrow was slightly hypercellular for age (40%) with maturing trilineage hematopoiesis, relatively diminished erythroid precursors, and adequate megakaryocytes. There was marginally increased polytypic plasma cells (5%). There was diffuse mild increase in reticulin. There were trace iron stores.  Flow cytometry revealed a low level monoclonal B population (1%) with nonspecific immunophenotype.  He rarely eats meat.  He denies any melena or hematochezia.  He has never had a colonoscopy.  Guaiac cards were negative 2 on 09/16/2014.   Symptomatically, he denies any complaint.  Exam reveals no adenopathy or hepatosplenomegaly.  Plan: 1.  Labs today:  CBC with diff, BMP, ferritin, epo level.  Review bone survey and bone marrow.  Etiology of cytopenias is multi-factorial.  No evidence of malignancy.  Discuss renal insufficiency and associated anemia.  Discuss potential use of Procrit in future if hemoglobin < 10 and hematocrit < 30.  Likely monoclonal gammopathy of unknown significance (MGUS).   2.  Follow-up pending cytogenetics. 3.  Encourage patient to follow-up  with nephrology. 4.  Begin ferrous sulfate 325 mg po q day with OJ or vitamin C secondary to limited iron staining in marrow.    Lequita Asal, MD  11/01/2015, 6:19 AM

## 2016-01-09 ENCOUNTER — Ambulatory Visit: Payer: Medicare Other | Admitting: Family Medicine

## 2016-01-27 ENCOUNTER — Inpatient Hospital Stay: Payer: Medicare Other | Attending: Hematology and Oncology | Admitting: Hematology and Oncology

## 2016-01-27 ENCOUNTER — Inpatient Hospital Stay: Payer: Medicare Other | Attending: Hematology and Oncology

## 2016-01-27 VITALS — BP 150/60 | HR 67 | Temp 97.7°F | Resp 18 | Wt 164.7 lb

## 2016-01-27 DIAGNOSIS — H409 Unspecified glaucoma: Secondary | ICD-10-CM | POA: Diagnosis not present

## 2016-01-27 DIAGNOSIS — N400 Enlarged prostate without lower urinary tract symptoms: Secondary | ICD-10-CM

## 2016-01-27 DIAGNOSIS — K439 Ventral hernia without obstruction or gangrene: Secondary | ICD-10-CM | POA: Insufficient documentation

## 2016-01-27 DIAGNOSIS — Z79899 Other long term (current) drug therapy: Secondary | ICD-10-CM

## 2016-01-27 DIAGNOSIS — E1122 Type 2 diabetes mellitus with diabetic chronic kidney disease: Secondary | ICD-10-CM | POA: Insufficient documentation

## 2016-01-27 DIAGNOSIS — J449 Chronic obstructive pulmonary disease, unspecified: Secondary | ICD-10-CM | POA: Insufficient documentation

## 2016-01-27 DIAGNOSIS — D631 Anemia in chronic kidney disease: Secondary | ICD-10-CM | POA: Insufficient documentation

## 2016-01-27 DIAGNOSIS — N2581 Secondary hyperparathyroidism of renal origin: Secondary | ICD-10-CM | POA: Insufficient documentation

## 2016-01-27 DIAGNOSIS — I129 Hypertensive chronic kidney disease with stage 1 through stage 4 chronic kidney disease, or unspecified chronic kidney disease: Secondary | ICD-10-CM | POA: Diagnosis not present

## 2016-01-27 DIAGNOSIS — Z85820 Personal history of malignant melanoma of skin: Secondary | ICD-10-CM | POA: Diagnosis not present

## 2016-01-27 DIAGNOSIS — N183 Chronic kidney disease, stage 3 (moderate): Secondary | ICD-10-CM | POA: Insufficient documentation

## 2016-01-27 DIAGNOSIS — D472 Monoclonal gammopathy: Secondary | ICD-10-CM

## 2016-01-27 DIAGNOSIS — M1990 Unspecified osteoarthritis, unspecified site: Secondary | ICD-10-CM | POA: Diagnosis not present

## 2016-01-27 DIAGNOSIS — D509 Iron deficiency anemia, unspecified: Secondary | ICD-10-CM

## 2016-01-27 DIAGNOSIS — M8580 Other specified disorders of bone density and structure, unspecified site: Secondary | ICD-10-CM | POA: Diagnosis not present

## 2016-01-27 DIAGNOSIS — Z87891 Personal history of nicotine dependence: Secondary | ICD-10-CM | POA: Insufficient documentation

## 2016-01-27 DIAGNOSIS — E785 Hyperlipidemia, unspecified: Secondary | ICD-10-CM | POA: Insufficient documentation

## 2016-01-27 DIAGNOSIS — D696 Thrombocytopenia, unspecified: Secondary | ICD-10-CM

## 2016-01-27 DIAGNOSIS — N184 Chronic kidney disease, stage 4 (severe): Secondary | ICD-10-CM

## 2016-01-27 LAB — BASIC METABOLIC PANEL
Anion gap: 10 (ref 5–15)
BUN: 58 mg/dL — ABNORMAL HIGH (ref 6–20)
CO2: 20 mmol/L — ABNORMAL LOW (ref 22–32)
Calcium: 8.4 mg/dL — ABNORMAL LOW (ref 8.9–10.3)
Chloride: 105 mmol/L (ref 101–111)
Creatinine, Ser: 1.97 mg/dL — ABNORMAL HIGH (ref 0.61–1.24)
GFR calc Af Amer: 33 mL/min — ABNORMAL LOW (ref >60)
GFR calc non Af Amer: 28 mL/min — ABNORMAL LOW (ref >60)
Glucose, Bld: 110 mg/dL — ABNORMAL HIGH (ref 65–99)
Potassium: 4.1 mmol/L (ref 3.5–5.1)
Sodium: 135 mmol/L (ref 135–145)

## 2016-01-27 LAB — CBC WITH DIFFERENTIAL/PLATELET
Basophils Absolute: 0 10*3/uL (ref 0–0.1)
Basophils Relative: 0 %
Eosinophils Absolute: 0.1 10*3/uL (ref 0–0.7)
Eosinophils Relative: 2 %
HCT: 28.2 % — ABNORMAL LOW (ref 40.0–52.0)
Hemoglobin: 9.3 g/dL — ABNORMAL LOW (ref 13.0–18.0)
Lymphocytes Relative: 15 %
Lymphs Abs: 0.8 10*3/uL — ABNORMAL LOW (ref 1.0–3.6)
MCH: 31.4 pg (ref 26.0–34.0)
MCHC: 32.9 g/dL (ref 32.0–36.0)
MCV: 95.6 fL (ref 80.0–100.0)
Monocytes Absolute: 0.5 10*3/uL (ref 0.2–1.0)
Monocytes Relative: 8 %
Neutro Abs: 4.1 10*3/uL (ref 1.4–6.5)
Neutrophils Relative %: 75 %
Platelets: 72 10*3/uL — ABNORMAL LOW (ref 150–440)
RBC: 2.95 MIL/uL — ABNORMAL LOW (ref 4.40–5.90)
RDW: 14.7 % — ABNORMAL HIGH (ref 11.5–14.5)
WBC: 5.5 10*3/uL (ref 3.8–10.6)

## 2016-01-27 LAB — FERRITIN: Ferritin: 38 ng/mL (ref 24–336)

## 2016-01-27 NOTE — Progress Notes (Signed)
[facility] day:   01/27/2016  Chief Complaint: Charles Rodgers is an 82 y.o. male with anemia, thrombocytopenia, chronic renal insufficiency, and a monoclonal gammopathy who is seen for 3 month assessment on oral iron.  HPI: The patient was last seen in the medical oncology clinic on 11/01/2015.  At that time, interval bone marrow was reviewed.  Bone marrow revealed no significant dysplasia or abnormal or overt neoplasia. There was marginally increased polytypic plasma cells (5%).  Cytogenetics were pending.    Bone survey on 10/03/2015 revealed no definite radiolucent lesions. There was diffuse osteopenia.  There were multiple old healed right lateral rib fractures.  He was felt to have a monoclonal gammopathy of unknown significance (MGUS).   Cytopenias were likely multifactorial, including renal insufficiency, anemia of chronic disease, and drug/medication effect. The possibility of a low-grade myelodysplastic process was not entirely excluded.  He was encouraged to begin oral iron.  Additonal studies on his bone marrow returned with a normal karyotype (46, XY) and normal SNP microarray.  There was a low level monoclonal B-cell population (1%) with a nonspecific immunotype detected by flow cytometry of uncertain clinical significance.  Symptomatically, he is doing well. He is taking his oral iron with orange juice. His energy level remains low. He saw the nephrologist.   Past Medical History:  Diagnosis Date  . Arthritis   . BPH (benign prostatic hypertrophy)    with elevated PSA, s/p benign biopsy, followed yearly by [facility] (last seen 01/16/2013)  . CKD (chronic kidney disease) stage 3, GFR 30-59 ml/min    [place]uru -> 2017 requested PCP start following  . COPD (chronic obstructive pulmonary disease) ([place]) 2016   by xray  . Diabetes mellitus type 2, controlled ([place])   . Glaucoma   . Hearing loss   . HLD (hyperlipidemia)   . HTN  (hypertension)   . IgM monoclonal gammopathy of uncertain significance 08/27/2014   07/2014 suggest rpt 6 mo.  . Malignant melanoma of left upper arm ([place]) 11/14/2014   s/p excision complicated by infected axillary seroma (Dr Carmin Muskrat)  . Melanoma ([place]) 2014   nose and left hand  . Secondary hyperparathyroidism ([place])   . Subclavian artery stenosis, right ([place]) 2014   found on carotid US, seen [facility], recommended recheck 1 yr at vascular lab with Dr. Delana Meyer  . Thyroid nodule 02/2013   right upper lobe - s/p biopsy - benign follicular nodule  . Urine incontinence     Past Surgical History:  Procedure Laterality Date  . APPENDECTOMY  1938  . arm surgery     Right after trauma  . BIOPSY THYROID Right [id number]/9211   benign follicular nodule  . carotid US  07/2007   WNL  . 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 US ECHOCARDIOGRAPHY  06/2007   EF 55%, mod dil LA, mild LVH,mild pulm HTN    Family History  Problem Relation Age of Onset  . Cancer Mother     stomach  . Cancer Brother     colon  . Cancer Brother     colon  . Cancer Sister     hodgkin's lymphoma  . Cancer Brother     prostate  . Diabetes Brother   . Coronary artery disease Neg Hx   . Stroke Neg Hx     Social History:  reports that he quit smoking about 48 years ago. His smoking  use included Cigarettes. He has a 7.50 pack-year smoking history. He has never used smokeless tobacco. He reports that he does not drink alcohol or use drugs.  The patient is accompanied by his brother, Richard.  Allergies: No Known Allergies  Current Medications: Current Outpatient Prescriptions  Medication Sig Dispense Refill  . amLODipine (NORVASC) 5 MG tablet TAKE 1 TABLET BY MOUTH ONCE A DAY 90 tablet 1  . apixaban (ELIQUIS) 2.5 MG TABS tablet Take 1 tablet (2.5 mg total) by mouth 2 (two) times daily. 60 tablet 3  . Blood Glucose Monitoring Suppl (ONE TOUCH ULTRA 2) W/DEVICE KIT     .  brimonidine (ALPHAGAN) 0.2 % ophthalmic solution Place 1 drop into both eyes 2 (two) times daily.    . Casanthranol-Docusate Sodium 30-100 MG CAPS Take by mouth daily.    . dorzolamide-timolol (COSOPT) 22.3-6.8 MG/ML ophthalmic solution     . doxazosin (CARDURA) 8 MG tablet TAKE ONE TABLET BY MOUTH AT BEDTIME 90 tablet 3  . ferrous sulfate 325 (65 FE) MG EC tablet Take 1 tablet (325 mg total) by mouth daily with breakfast. 30 tablet 2  . furosemide (LASIX) 20 MG tablet TAKE 1 TABLET BY MOUTH DAILY 30 tablet 3  . glucose blood (ONE TOUCH ULTRA TEST) test strip USE TO CHECK BLOOD SUGAR 3 TIMES DAILY Dx: E11.42 300 each 2  . latanoprost (XALATAN) 0.005 % ophthalmic solution     . lisinopril-hydrochlorothiazide (PRINZIDE,ZESTORETIC) 20-25 MG tablet TAKE ONE TABLET BY MOUTH EVERY DAY 90 tablet 3  . Multiple Vitamins-Minerals (PRESERVISION AREDS) CAPS Take 2 capsules by mouth daily.    . ONETOUCH DELICA LANCETS MISC Use as directed    . potassium chloride (K-DUR) 10 MEQ tablet TAKE 1 TABLET BY MOUTH DAILY AS NEEDED 30 tablet 3  . pravastatin (PRAVACHOL) 40 MG tablet TAKE ONE TABLET BY MOUTH AT BEDTIME 90 tablet 3  . timolol (BETIMOL) 0.5 % ophthalmic solution Place 1 drop into both eyes 2 (two) times daily.    . Vitamin D, Ergocalciferol, (DRISDOL) 50000 units CAPS capsule Take 50,000 Units by mouth every 30 (thirty) days.     No current facility-administered medications for this visit.     Review of Systems:  GENERAL:  Energy level is low.  No fevers or sweats .  Weight up 1 pound. PERFORMANCE STATUS (ECOG):  1 HEENT:  Hard of hearing.  No visual changes, runny nose, sore throat, mouth sores or tenderness. Lungs: No shortness of breath or cough.  No hemoptysis. Cardiac:  No chest pain, palpitations, orthopnea, or PND. GI:  No nausea, vomiting, diarrhea, constipation, melena or hematochezia. GU:  No urgency, frequency, dysuria, or hematuria. Musculoskeletal:  No back pain.  Arthritis pain.  No  muscle tenderness. Extremities:  No pain or swelling. Skin:  Melanoma s/p removal.  No rashes or skin changes. Neuro:  No headache, numbness or weakness, balance or coordination issues. Endocrine:  No diabetes, thyroid issues, hot flashes or night sweats. Psych:  No mood changes, depression or anxiety. Pain:  No focal pain. Review of systems:  All other systems reviewed and found to be negative.   Physical Exam: 163 Blood pressure (!) 150/60, pulse 67, temperature 97.7 F (36.5 C), temperature source Tympanic, resp. rate 18, weight 164 lb 10.9 oz (74.7 kg). GENERAL:  Well developed, well nourished, elderly gentleman sitting comfortably in the exam room in no acute distress. MENTAL STATUS:  Alert and oriented to person, place and time. HEAD:  Wearing a white cap.    White hair.  Male pattern baldness.  Normocephalic, atraumatic, face symmetric, no Cushingoid features. EYES:  Glasses.  Hazel/brown eyes.  Pupils equal round and reactive to light and accomodation.  No conjunctivitis or scleral icterus. ENT:  Hearing aides.  Oropharynx clear without lesion.  Tongue normal. Mucous membranes moist.  RESPIRATORY:  Clear to auscultation without rales, wheezes or rhonchi. CARDIOVASCULAR:  Regular rate and rhythm without murmur, rub or gallop. ABDOMEN:  Ventral hernia.  Soft, non-tender, with active bowel sounds, and no hepatosplenomegaly.  No masses. SKIN:  Changes due to sun exposure.  No rashes, ulcers or lesions. EXTREMITIES: Trace ankle edema.  No skin discoloration or tenderness.  No palpable cords. LYMPH NODES: No palpable cervical, supraclavicular, axillary or inguinal adenopathy  NEUROLOGICAL: Unremarkable. PSYCH:  Appropriate.    Appointment on 01/27/2016  Component Date Value Ref Range Status  . WBC 01/27/2016 5.5  3.8 - 10.6 K/uL Final  . RBC 01/27/2016 2.95* 4.40 - 5.90 MIL/uL Final  . Hemoglobin 01/27/2016 9.3* 13.0 - 18.0 g/dL Final  . HCT 01/27/2016 28.2* 40.0 - 52.0 % Final  .  MCV 01/27/2016 95.6  80.0 - 100.0 fL Final  . MCH 01/27/2016 31.4  26.0 - 34.0 pg Final  . MCHC 01/27/2016 32.9  32.0 - 36.0 g/dL Final  . RDW 01/27/2016 14.7* 11.5 - 14.5 % Final  . Platelets 01/27/2016 72* 150 - 440 K/uL Final  . Neutrophils Relative % 01/27/2016 75  % Final  . Neutro Abs 01/27/2016 4.1  1.4 - 6.5 K/uL Final  . Lymphocytes Relative 01/27/2016 15  % Final  . Lymphs Abs 01/27/2016 0.8* 1.0 - 3.6 K/uL Final  . Monocytes Relative 01/27/2016 8  % Final  . Monocytes Absolute 01/27/2016 0.5  0.2 - 1.0 K/uL Final  . Eosinophils Relative 01/27/2016 2  % Final  . Eosinophils Absolute 01/27/2016 0.1  0 - 0.7 K/uL Final  . Basophils Relative 01/27/2016 0  % Final  . Basophils Absolute 01/27/2016 0.0  0 - 0.1 K/uL Final  . Sodium 01/27/2016 135  135 - 145 mmol/L Final  . Potassium 01/27/2016 4.1  3.5 - 5.1 mmol/L Final  . Chloride 01/27/2016 105  101 - 111 mmol/L Final  . CO2 01/27/2016 20* 22 - 32 mmol/L Final  . Glucose, Bld 01/27/2016 110* 65 - 99 mg/dL Final  . BUN 01/27/2016 58* 6 - 20 mg/dL Final  . Creatinine, Ser 01/27/2016 1.97* 0.61 - 1.24 mg/dL Final  . Calcium 01/27/2016 8.4* 8.9 - 10.3 mg/dL Final  . GFR calc non Af Amer 01/27/2016 28* >60 mL/min Final  . GFR calc Af Amer 01/27/2016 33* >60 mL/min Final   Comment: (NOTE) The eGFR has been calculated using the CKD EPI equation. This calculation has not been validated in all clinical situations. eGFR's persistently <60 mL/min signify possible Chronic Kidney Disease.   . Anion gap 01/27/2016 10  5 - 15 Final    Assessment:  Charles Rodgers is an 82 y.o. male with mild cytopenias likely multi-factorial in nature (renal insufficiency and anemia of chronic disease).  He has a 2 year history of a normocytic anemia, thrombocytopenia and a normal white count.    He has a monoclonal gammopathy of unknown significance (MGUS).  Serum protein electrophoresis (SPEP) was 0.3 gm/dL on 08/15/2014 and 0.4 gm.dL on  09/11/2014. Kappa free light chains were 147.45, lambda free light chains 32.32, with a kappa lambda ratio of 4.56 (0.26-1.65).  Immunoglobulin levels were normal.  24-hour urine revealed kappa   free light chains of 30.6, lambda free light chains 2.25 and a ratio 13.6 (2.04-10.37). 24 hour urine protein electrophoresis revealed no monoclonal protein.  He has chronic renal insufficiency (Cr 1.7-2.03) over the past 2 years without trend.  He denies any new medications or herbal products.  Ferritin is 38.  Peripheral smear on 08/27/2014 revealed no abnormal white blood cells, thrombocytopenia without clumping and some large platelets. There were elliptocytes, teardrop red blood cells, and schistocytes. Teardrop RBCs and elliptocytes suggest possible extramedullary hematopoiesis (myelofibrosis).  Myelodysplastic syndrome with deletion 20q is a consideration.  Work-up on 09/11/2014 included CBC with a hematocrit 30.4, hemoglobin 9.7, MCV 95.1, platelets 80,000, white count 5300 with an [place] of 3800. Differential was unremarkable.  B12, folate, TSH, ANA, PT, PTT, LDH.  Reticulocyte count was low at 3.19%. Ferritin was 47 with a iron saturation of 20% and a TIBC of 265 (normal).  Bone survey on 10/03/2014 revealed subtle lucencies within the calvarium, left femoral head, and proximal left tibia which could reflect myelomatous involvement.  Bone survey on 10/03/2015 revealed no definite radiolucent lesions. There was diffuse osteopenia.  There were multiple old healed right lateral rib fractures.  Bone marrow aspirate and biopsy on 10/15/2015 revealed no significant dysplasia or abnormal or overt neoplasia. Marrow was slightly hypercellular for age (40%) with maturing trilineage hematopoiesis, relatively diminished erythroid precursors, and adequate megakaryocytes. There was marginally increased polytypic plasma cells (5%). There was diffuse mild increase in reticulin. There were trace iron stores.  Flow cytometry  revealed a low level monoclonal B population (1%) with nonspecific immunophenotype.  Cytogenetics were normal (46, XY) with a normal SNP microarray.   He rarely eats meat.  He denies any melena or hematochezia.  He has never had a colonoscopy.  Guaiac cards were negative 2 on 09/16/2014.   Symptomatically, he denies any complaint.  Exam reveals no adenopathy or hepatosplenomegaly.  Hematocrit is 28.2 (today).    Plan: 1.  Labs today:  CBC with diff, BMP, ferritin, epo level. 2.  Confirm preauth for Procrit. 3.  RTC in 3 months for MD assess and labs (CBC with diff, CMP, ferritin, SPEP, free light chains).   Melissa C Corcoran, MD  01/27/2016, 4:04 PM  

## 2016-01-28 LAB — ERYTHROPOIETIN: Erythropoietin: 10 m[IU]/mL (ref 2.6–18.5)

## 2016-01-30 ENCOUNTER — Other Ambulatory Visit: Payer: Self-pay | Admitting: Hematology and Oncology

## 2016-02-18 ENCOUNTER — Other Ambulatory Visit: Payer: Self-pay | Admitting: Family Medicine

## 2016-02-19 ENCOUNTER — Other Ambulatory Visit: Payer: Self-pay | Admitting: Family Medicine

## 2016-02-24 ENCOUNTER — Other Ambulatory Visit: Payer: Self-pay | Admitting: *Deleted

## 2016-02-24 ENCOUNTER — Encounter: Payer: Self-pay | Admitting: Family Medicine

## 2016-02-24 ENCOUNTER — Ambulatory Visit (INDEPENDENT_AMBULATORY_CARE_PROVIDER_SITE_OTHER): Payer: Medicare Other | Admitting: Family Medicine

## 2016-02-24 VITALS — BP 150/60 | HR 75 | Temp 98.2°F | Resp 16 | Ht 65.0 in | Wt 165.5 lb

## 2016-02-24 DIAGNOSIS — R011 Cardiac murmur, unspecified: Secondary | ICD-10-CM

## 2016-02-24 DIAGNOSIS — I4891 Unspecified atrial fibrillation: Secondary | ICD-10-CM | POA: Diagnosis not present

## 2016-02-24 DIAGNOSIS — E1142 Type 2 diabetes mellitus with diabetic polyneuropathy: Secondary | ICD-10-CM

## 2016-02-24 DIAGNOSIS — N183 Chronic kidney disease, stage 3 unspecified: Secondary | ICD-10-CM

## 2016-02-24 DIAGNOSIS — I10 Essential (primary) hypertension: Secondary | ICD-10-CM

## 2016-02-24 DIAGNOSIS — R600 Localized edema: Secondary | ICD-10-CM

## 2016-02-24 MED ORDER — LATANOPROST 0.005 % OP SOLN: 1.0000 [drp] | Freq: Every day | OPHTHALMIC | Status: AC

## 2016-02-24 MED ORDER — DORZOLAMIDE HCL-TIMOLOL MAL 2-0.5 % OP SOLN: 1.0000 [drp] | Freq: Two times a day (BID) | OPHTHALMIC | Status: AC

## 2016-02-24 NOTE — Assessment & Plan Note (Signed)
Chronic, diet controlled and stable. No changes indicated. A1c next visit.

## 2016-02-24 NOTE — Progress Notes (Signed)
BP (!) 150/60 (BP Location: Right Arm, Cuff Size: Normal)   Pulse 75   Temp 98.2 F (36.8 C) (Oral)   Resp 16   Ht 5' 5"  (1.651 m)   Wt 165 lb 8 oz (75.1 kg)   SpO2 99%   BMI 27.54 kg/m    CC: 26mof/u visit Subjective:    Patient ID: BPeng Thorstenson male    DOB: 704/26/1926 82y.o.   MRN: [id number] HPI: BJaymes Hangis a 82y.o. male presenting on 02/24/2016 for Follow-up (3-Mth: DM II; HTN; HLD; BPH; CKD III)   Atrial fibrillation - pt declined cardiology referral, we started on eliquis 2.552mbid. Pt worried about affordability of medication. Denies bruising/bleeding.  Followed by Dr CoMike Gipor multifactorial cytopenias. Next appt 03/2016.  DM - regularly does check sugars fasting 100-130. Compliant with antihyperglycemic regimen which includes: diet controlled. Denies low sugars or hypoglycemic symptoms. Denies paresthesias. Last diabetic eye exam 02/2016 appt scheduled.  Pneumovax: 2013.  Prevnar: 2015. Lab Results  Component Value Date   HGBA1C 6.7 (H) 08/27/2015   Diabetic Foot Exam - Simple   Simple Foot Form Diabetic Foot exam was performed with the following findings:  Yes 02/24/2016 12:50 PM  Visual Inspection No deformities, no ulcerations, no other skin breakdown bilaterally:  Yes Sensation Testing See comments:  Yes Pulse Check See comments:  Yes Comments 1+ DP bilaterally Diminished sensation to monofilament testing      Relevant past medical, surgical, family and social history reviewed and updated as indicated. Interim medical history since our last visit reviewed. Allergies and medications reviewed and updated. Current Outpatient Prescriptions on File Prior to Visit  Medication Sig  . amLODipine (NORVASC) 5 MG tablet TAKE 1 TABLET BY MOUTH ONCE A DAY  . Blood Glucose Monitoring Suppl (ONE TOUCH ULTRA 2) W/DEVICE KIT   . Casanthranol-Docusate Sodium 30-100 MG CAPS Take by mouth daily.  . Marland Kitchenoxazosin (CARDURA) 8 MG tablet TAKE 1  TABLET BY MOUTH DAILY AT BEDTIME  . ELIQUIS 2.5 MG TABS tablet TAKE 1 TABLET BY MOUTH TWICE A DAY  . ferrous sulfate 325 (65 FE) MG tablet TAKE 1 TABLET BY MOUTH ONCE A DAY WITH BREAKFAST.  . furosemide (LASIX) 20 MG tablet TAKE 1 TABLET BY MOUTH DAILY  . glucose blood (ONE TOUCH ULTRA TEST) test strip USE TO CHECK BLOOD SUGAR 3 TIMES DAILY Dx: E11.42  . lisinopril-hydrochlorothiazide (PRINZIDE,ZESTORETIC) 20-25 MG tablet TAKE ONE TABLET BY MOUTH EVERY DAY  . Multiple Vitamins-Minerals (PRESERVISION AREDS) CAPS Take 2 capsules by mouth daily.  . Glory RosebushELICA LANCETS MISC Use as directed  . potassium chloride (K-DUR) 10 MEQ tablet TAKE 1 TABLET BY MOUTH DAILY AS NEEDED  . pravastatin (PRAVACHOL) 40 MG tablet TAKE 1 TABLET BY MOUTH DAILY AT BEDTIME  . Vitamin D, Ergocalciferol, (DRISDOL) 50000 units CAPS capsule Take 50,000 Units by mouth every 30 (thirty) days.   No current facility-administered medications on file prior to visit.     Review of Systems Per HPI unless specifically indicated in ROS section     Objective:    BP (!) 150/60 (BP Location: Right Arm, Cuff Size: Normal)   Pulse 75   Temp 98.2 F (36.8 C) (Oral)   Resp 16   Ht 5' 5"  (1.651 m)   Wt 165 lb 8 oz (75.1 kg)   SpO2 99%   BMI 27.54 kg/m   Wt Readings from Last 3 Encounters:  02/24/16 165 lb 8 oz (75.1 kg)  01/27/16 164 lb 10.9 oz (74.7 kg)  11/21/15 163 lb 8 oz (74.2 kg)    Physical Exam  Constitutional: He appears well-developed and well-nourished. No distress.  HENT:  Head: Normocephalic and atraumatic.  Right Ear: External ear normal.  Left Ear: External ear normal.  Nose: Nose normal.  Mouth/Throat: Oropharynx is clear and moist. No oropharyngeal exudate.  Eyes: Conjunctivae and EOM are normal. Pupils are equal, round, and reactive to light. No scleral icterus.  Neck: Normal range of motion. Neck supple.  Cardiovascular: Normal rate, normal heart sounds and intact distal pulses.  An irregularly  irregular rhythm present.  No murmur heard. Pulmonary/Chest: Effort normal and breath sounds normal. No respiratory distress. He has no wheezes. He has no rales.  Musculoskeletal: He exhibits edema (1+ ankle edema).  See HPI for foot exam if done  Lymphadenopathy:    He has no cervical adenopathy.  Skin: Skin is warm and dry. No rash noted.  Psychiatric: He has a normal mood and affect.  Nursing note and vitals reviewed.  Results for orders placed or performed in visit on 01/27/16  CBC with Differential/Platelet  Result Value Ref Range   WBC 5.5 3.8 - 10.6 K/uL   RBC 2.95 (L) 4.40 - 5.90 MIL/uL   Hemoglobin 9.3 (L) 13.0 - 18.0 g/dL   HCT 28.2 (L) 40.0 - 52.0 %   MCV 95.6 80.0 - 100.0 fL   MCH 31.4 26.0 - 34.0 pg   MCHC 32.9 32.0 - 36.0 g/dL   RDW 14.7 (H) 11.5 - 14.5 %   Platelets 72 (L) 150 - 440 K/uL   Neutrophils Relative % 75 %   Neutro Abs 4.1 1.4 - 6.5 K/uL   Lymphocytes Relative 15 %   Lymphs Abs 0.8 (L) 1.0 - 3.6 K/uL   Monocytes Relative 8 %   Monocytes Absolute 0.5 0.2 - 1.0 K/uL   Eosinophils Relative 2 %   Eosinophils Absolute 0.1 0 - 0.7 K/uL   Basophils Relative 0 %   Basophils Absolute 0.0 0 - 0.1 K/uL  Basic metabolic panel  Result Value Ref Range   Sodium 135 135 - 145 mmol/L   Potassium 4.1 3.5 - 5.1 mmol/L   Chloride 105 101 - 111 mmol/L   CO2 20 (L) 22 - 32 mmol/L   Glucose, Bld 110 (H) 65 - 99 mg/dL   BUN 58 (H) 6 - 20 mg/dL   Creatinine, Ser 1.97 (H) 0.61 - 1.24 mg/dL   Calcium 8.4 (L) 8.9 - 10.3 mg/dL   GFR calc non Af Amer 28 (L) >60 mL/min   GFR calc Af Amer 33 (L) >60 mL/min   Anion gap 10 5 - 15  Ferritin  Result Value Ref Range   Ferritin 38 24 - 336 ng/mL  Erythropoietin  Result Value Ref Range   Erythropoietin 10.0 2.6 - 18.5 mIU/mL   EKG - afib, LAD, septal q waves    Assessment & Plan:   Problem List Items Addressed This Visit    Atrial fibrillation with slow ventricular response ([place]) - Primary    Ongoing. Continue low dose  eliquis. CHADSVASC2 score = 4 HASBLED = 1      Relevant Orders   EKG 12-Lead (Completed)   Cardiac murmur    Not appreciated today.       CKD (chronic kidney disease) stage 3, GFR 30-59 ml/min    Stable period. Labs next visit.       HTN (hypertension)    Chronic.  Mildly elevated today. No indication for additional medication at this time.       Pedal edema    At ankle. ?amlodipine related. Continue 44m daily at this time.      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 No changes indicated. A1c next visit.           Follow up plan: Return in about 4 months (around 06/23/2016) for follow up visit.  JRia Bush MD

## 2016-02-24 NOTE — Patient Instructions (Addendum)
Have eye doctor send me his report (diabetes eye exam) EKG today Return in 4 months for follow up visit

## 2016-02-24 NOTE — Assessment & Plan Note (Signed)
At ankle. ?amlodipine related. Continue 5mg  daily at this time.

## 2016-02-24 NOTE — Progress Notes (Signed)
Pre visit review using our clinic review tool, if applicable. No additional management support is needed unless otherwise documented below in the visit note/SLS  

## 2016-02-24 NOTE — Assessment & Plan Note (Signed)
Not appreciated today.  

## 2016-02-24 NOTE — Progress Notes (Unsigned)
Pre visit review using our clinic review tool, if applicable. No additional management support is needed unless otherwise documented below in the visit note/SLS  

## 2016-02-24 NOTE — Assessment & Plan Note (Signed)
Stable period. Labs next visit.

## 2016-02-24 NOTE — Assessment & Plan Note (Signed)
Ongoing. Continue low dose eliquis. CHADSVASC2 score = 4 HASBLED = 1

## 2016-02-24 NOTE — Assessment & Plan Note (Signed)
Chronic. Mildly elevated today. No indication for additional medication at this time.

## 2016-02-25 ENCOUNTER — Other Ambulatory Visit: Payer: Self-pay | Admitting: Family Medicine

## 2016-03-11 ENCOUNTER — Encounter: Payer: Self-pay | Admitting: Hematology and Oncology

## 2016-03-27 DIAGNOSIS — I4891 Unspecified atrial fibrillation: Secondary | ICD-10-CM | POA: Diagnosis not present

## 2016-03-27 DIAGNOSIS — H6981 Other specified disorders of Eustachian tube, right ear: Secondary | ICD-10-CM | POA: Diagnosis not present

## 2016-03-31 ENCOUNTER — Ambulatory Visit: Payer: Medicare Other | Admitting: Internal Medicine

## 2016-04-14 ENCOUNTER — Other Ambulatory Visit: Payer: Self-pay | Admitting: Family Medicine

## 2016-04-20 DIAGNOSIS — H353132 Nonexudative age-related macular degeneration, bilateral, intermediate dry stage: Secondary | ICD-10-CM | POA: Diagnosis not present

## 2016-04-27 ENCOUNTER — Other Ambulatory Visit: Payer: Medicare Other

## 2016-04-27 ENCOUNTER — Ambulatory Visit: Payer: Medicare Other | Admitting: Hematology and Oncology

## 2016-04-27 ENCOUNTER — Other Ambulatory Visit: Payer: Self-pay | Admitting: Family Medicine

## 2016-04-27 ENCOUNTER — Other Ambulatory Visit: Payer: Self-pay | Admitting: Hematology and Oncology

## 2016-05-05 ENCOUNTER — Other Ambulatory Visit: Payer: Self-pay | Admitting: *Deleted

## 2016-05-05 DIAGNOSIS — D472 Monoclonal gammopathy: Secondary | ICD-10-CM

## 2016-05-06 ENCOUNTER — Encounter: Payer: Self-pay | Admitting: Internal Medicine

## 2016-05-06 ENCOUNTER — Ambulatory Visit (INDEPENDENT_AMBULATORY_CARE_PROVIDER_SITE_OTHER): Payer: Medicare Other | Admitting: Internal Medicine

## 2016-05-06 VITALS — BP 130/68 | HR 68 | Temp 98.2°F | Wt 164.0 lb

## 2016-05-06 DIAGNOSIS — H6981 Other specified disorders of Eustachian tube, right ear: Secondary | ICD-10-CM

## 2016-05-06 NOTE — Progress Notes (Signed)
Subjective:    Patient ID: Charles Rodgers, male    DOB: [birth year], 83 y.o.   MRN: [id number]  HPI  Pt presents to the clinic today with c/o a "buzzing/ticking" sound in his right ear. He reports this started 6 weeks ago. It is intermittent, last occurred last night. He went to a walk in clinic for the same 03/27/16. He was given Amoxil, which he took. He reports it did help temporarily. He denies runny nose, nasal congestion, ear pain or sore throat. He does wear hearing aids, but denies increased loss of hearing. He has not taken anything OTC for this.   Review of Systems      Past Medical History:  Diagnosis Date  . Arthritis   . BPH (benign prostatic hypertrophy)    with elevated PSA, s/p benign biopsy, followed yearly by [facility] (last seen 01/16/2013)  . CKD (chronic kidney disease) stage 3, GFR 30-59 ml/min    [place] -> 2017 requested PCP start following  . COPD (chronic obstructive pulmonary disease) ([place]) 2016   by xray  . Diabetes mellitus type 2, controlled ([place])   . Glaucoma   . Hearing loss   . HLD (hyperlipidemia)   . HTN (hypertension)   . IgM monoclonal gammopathy of uncertain significance 08/27/2014   07/2014 suggest rpt 6 mo.  . Malignant melanoma of left upper arm ([place]) 11/14/2014   s/p excision complicated by infected axillary seroma (Dr Carmin Muskrat)  . Melanoma ([place]) 2014   nose and left hand  . Secondary hyperparathyroidism ([place])   . Subclavian artery stenosis, right ([place]) 2014   found on carotid US, seen [facility], recommended recheck 1 yr at vascular lab with Dr. Delana Meyer  . Thyroid nodule 02/2013   right upper lobe - s/p biopsy - benign follicular nodule  . Urine incontinence     Current Outpatient Prescriptions  Medication Sig Dispense Refill  . amLODipine (NORVASC) 5 MG tablet TAKE 1 TABLET BY MOUTH ONCE A DAY 90 tablet 1  . Blood Glucose Monitoring Suppl (ONE TOUCH ULTRA 2) W/DEVICE KIT     . Casanthranol-Docusate Sodium 30-100 MG CAPS Take by  mouth daily.    . dorzolamide-timolol (COSOPT) 22.3-6.8 MG/ML ophthalmic solution Place 1 drop into both eyes 2 (two) times daily. 10 mL   . doxazosin (CARDURA) 8 MG tablet TAKE 1 TABLET BY MOUTH DAILY AT BEDTIME 90 tablet 0  . ELIQUIS 2.5 MG TABS tablet TAKE 1 TABLET BY MOUTH TWICE (2) DAILY 180 tablet 1  . ferrous sulfate 325 (65 FE) MG tablet TAKE 1 TABLET BY MOUTH ONCE A DAY WITH BREAKFAST. 90 tablet 1  . furosemide (LASIX) 20 MG tablet TAKE 1 TABLET BY MOUTH DAILY 30 tablet 6  . glucose blood (ONE TOUCH ULTRA TEST) test strip USE TO CHECK BLOOD SUGAR 3 TIMES DAILY Dx: E11.42 300 each 2  . latanoprost (XALATAN) 0.005 % ophthalmic solution Place 1 drop into both eyes at bedtime. 2.5 mL   . lisinopril-hydrochlorothiazide (PRINZIDE,ZESTORETIC) 20-25 MG tablet TAKE 1 TABLET BY MOUTH ONCE A DAY 90 tablet 1  . Multiple Vitamins-Minerals (PRESERVISION AREDS) CAPS Take 2 capsules by mouth daily.    Glory Rosebush DELICA LANCETS MISC Use as directed    . potassium chloride (K-DUR) 10 MEQ tablet TAKE 1 TABLET BY MOUTH DAILY AS NEEDED 30 tablet 3  . pravastatin (PRAVACHOL) 40 MG tablet TAKE 1 TABLET BY MOUTH DAILY AT BEDTIME 90 tablet 0  . Vitamin D, Ergocalciferol, (DRISDOL) 50000 units CAPS  capsule Take 50,000 Units by mouth every 30 (thirty) days.     No current facility-administered medications for this visit.     No Known Allergies  Family History  Problem Relation Age of Onset  . Cancer Mother     stomach  . Cancer Brother     colon  . Cancer Brother     colon  . Cancer Sister     hodgkin's lymphoma  . Cancer Brother     prostate  . Diabetes Brother   . Coronary artery disease Neg Hx   . Stroke Neg Hx     Social History   Social History  . Marital status: Married    Spouse name: N/A  . Number of children: N/A  . Years of education: N/A   Occupational History  . Not on file.   Social History Main Topics  . Smoking status: Former Smoker    Packs/day: 0.50    Years: 15.00      Types: Cigarettes    Quit date: 03/31/1967  . Smokeless tobacco: Never Used  . Alcohol use No  . Drug use: No  . Sexual activity: Not on file   Other Topics Concern  . Not on file   Social History Narrative   Caffeine: rare   Lives with wife, 1 dog (grown children)   Occupation: retired, worked Tourist information centre manager at [employer]   Edu: 5th grade   Activity: works Haematologist   Diet: some water, seldom fruits/vegetables     Constitutional: Denies fever, malaise, fatigue, headache or abrupt weight changes.  HEENT: Pt reports hearing disturbance. Denies eye pain, eye redness, ear pain, ringing in the ears, wax buildup, runny nose, nasal congestion, bloody nose, or sore throat. Respiratory: Denies difficulty breathing, shortness of breath, cough or sputum production.    No other specific complaints in a complete review of systems (except as listed in HPI above).  Objective:   Physical Exam   BP 130/68   Pulse 68   Temp 98.2 F (36.8 C) (Oral)   Wt 164 lb (74.4 kg)   SpO2 98%   BMI 27.29 kg/m  Wt Readings from Last 3 Encounters:  05/06/16 164 lb (74.4 kg)  02/24/16 165 lb 8 oz (75.1 kg)  01/27/16 164 lb 10.9 oz (74.7 kg)    General: Appears his stated age, in NAD. HEENT: Right Ear: Tm's gray and intact, cloudy but normal light reflex.   BMET    Component Value Date/Time   NA 135 01/27/2016 1508   NA 140 06/15/2014   NA 136 06/26/2011 0358   K 4.1 01/27/2016 1508   K 3.9 12/21/2014   CL 105 01/27/2016 1508   CL 107 06/26/2011 0358   CO2 20 (L) 01/27/2016 1508   CO2 17 (L) 06/26/2011 0358   GLUCOSE 110 (H) 01/27/2016 1508   GLUCOSE 197 (H) 06/26/2011 0358   BUN 58 (H) 01/27/2016 1508   BUN 43 (A) 06/15/2014   BUN 33 (H) 06/26/2011 0358   CREATININE 1.97 (H) 01/27/2016 1508   CREATININE 1.61 12/21/2014   CALCIUM 8.4 (L) 01/27/2016 1508   CALCIUM 9.0 09/08/2012   CALCIUM 9.2 05/10/2012 0822   GFRNONAA 28 (L) 01/27/2016 1508   GFRNONAA 38 06/15/2014   GFRAA 33  (L) 01/27/2016 1508   GFRAA 48 (L) 06/26/2011 0358    Lipid Panel     Component Value Date/Time   CHOL 100 08/27/2015 1114   CHOL 111 07/13/2011   TRIG 74.0 08/27/2015 1114  TRIG 78 09/08/2012   HDL 31.00 (L) 08/27/2015 1114   CHOLHDL 3 08/27/2015 1114   VLDL 14.8 08/27/2015 1114   LDLCALC 55 08/27/2015 1114    CBC    Component Value Date/Time   WBC 5.5 01/27/2016 1508   RBC 2.95 (L) 01/27/2016 1508   HGB 9.3 (L) 01/27/2016 1508   HGB 9.3 12/21/2014   HCT 28.2 (L) 01/27/2016 1508   HCT 29.7 (L) 06/26/2011 0358   PLT 72 (L) 01/27/2016 1508   PLT 78 (L) 06/26/2011 0358   MCV 95.6 01/27/2016 1508   MCV 96 06/26/2011 0358   MCH 31.4 01/27/2016 1508   MCHC 32.9 01/27/2016 1508   RDW 14.7 (H) 01/27/2016 1508   RDW 82 09/08/2012   LYMPHSABS 0.8 (L) 01/27/2016 1508   LYMPHSABS 1.0 06/26/2011 0358   MONOABS 0.5 01/27/2016 1508   MONOABS 0.8 (H) 06/26/2011 0358   EOSABS 0.1 01/27/2016 1508   EOSABS 0.1 06/26/2011 0358   BASOSABS 0.0 01/27/2016 1508   BASOSABS 0.0 06/26/2011 0358    Hgb A1C Lab Results  Component Value Date   HGBA1C 6.7 (H) 08/27/2015           Assessment & Plan:   ETD, right:  No indication for repeat abx Will try Flonase OTC, instructed on how to use If no improvement, can refer to Allergy vs ENT  RTC as needed or if symptoms persist or worsen Keani Gotcher, NP

## 2016-05-06 NOTE — Patient Instructions (Signed)

## 2016-05-07 ENCOUNTER — Other Ambulatory Visit: Payer: Self-pay | Admitting: Hematology and Oncology

## 2016-05-07 ENCOUNTER — Encounter: Payer: Self-pay | Admitting: Hematology and Oncology

## 2016-05-07 ENCOUNTER — Inpatient Hospital Stay: Payer: Medicare Other | Attending: Hematology and Oncology

## 2016-05-07 ENCOUNTER — Inpatient Hospital Stay (HOSPITAL_BASED_OUTPATIENT_CLINIC_OR_DEPARTMENT_OTHER): Payer: Medicare Other | Admitting: Hematology and Oncology

## 2016-05-07 VITALS — BP 144/76 | HR 71 | Temp 96.1°F | Resp 18 | Wt 162.0 lb

## 2016-05-07 DIAGNOSIS — Z87891 Personal history of nicotine dependence: Secondary | ICD-10-CM | POA: Insufficient documentation

## 2016-05-07 DIAGNOSIS — K439 Ventral hernia without obstruction or gangrene: Secondary | ICD-10-CM | POA: Insufficient documentation

## 2016-05-07 DIAGNOSIS — N400 Enlarged prostate without lower urinary tract symptoms: Secondary | ICD-10-CM | POA: Insufficient documentation

## 2016-05-07 DIAGNOSIS — Z85820 Personal history of malignant melanoma of skin: Secondary | ICD-10-CM

## 2016-05-07 DIAGNOSIS — Z79899 Other long term (current) drug therapy: Secondary | ICD-10-CM | POA: Insufficient documentation

## 2016-05-07 DIAGNOSIS — Z800 Family history of malignant neoplasm of digestive organs: Secondary | ICD-10-CM | POA: Insufficient documentation

## 2016-05-07 DIAGNOSIS — Z807 Family history of other malignant neoplasms of lymphoid, hematopoietic and related tissues: Secondary | ICD-10-CM

## 2016-05-07 DIAGNOSIS — Z8042 Family history of malignant neoplasm of prostate: Secondary | ICD-10-CM | POA: Insufficient documentation

## 2016-05-07 DIAGNOSIS — E1122 Type 2 diabetes mellitus with diabetic chronic kidney disease: Secondary | ICD-10-CM

## 2016-05-07 DIAGNOSIS — M8580 Other specified disorders of bone density and structure, unspecified site: Secondary | ICD-10-CM | POA: Diagnosis not present

## 2016-05-07 DIAGNOSIS — N2581 Secondary hyperparathyroidism of renal origin: Secondary | ICD-10-CM | POA: Insufficient documentation

## 2016-05-07 DIAGNOSIS — D4621 Refractory anemia with excess of blasts 1: Secondary | ICD-10-CM | POA: Diagnosis not present

## 2016-05-07 DIAGNOSIS — N183 Chronic kidney disease, stage 3 (moderate): Secondary | ICD-10-CM | POA: Insufficient documentation

## 2016-05-07 DIAGNOSIS — D696 Thrombocytopenia, unspecified: Secondary | ICD-10-CM | POA: Diagnosis not present

## 2016-05-07 DIAGNOSIS — N184 Chronic kidney disease, stage 4 (severe): Principal | ICD-10-CM

## 2016-05-07 DIAGNOSIS — D472 Monoclonal gammopathy: Secondary | ICD-10-CM

## 2016-05-07 DIAGNOSIS — D631 Anemia in chronic kidney disease: Secondary | ICD-10-CM | POA: Diagnosis not present

## 2016-05-07 DIAGNOSIS — E785 Hyperlipidemia, unspecified: Secondary | ICD-10-CM | POA: Diagnosis not present

## 2016-05-07 DIAGNOSIS — H409 Unspecified glaucoma: Secondary | ICD-10-CM | POA: Diagnosis not present

## 2016-05-07 DIAGNOSIS — H9190 Unspecified hearing loss, unspecified ear: Secondary | ICD-10-CM

## 2016-05-07 DIAGNOSIS — I129 Hypertensive chronic kidney disease with stage 1 through stage 4 chronic kidney disease, or unspecified chronic kidney disease: Secondary | ICD-10-CM

## 2016-05-07 DIAGNOSIS — J449 Chronic obstructive pulmonary disease, unspecified: Secondary | ICD-10-CM | POA: Insufficient documentation

## 2016-05-07 DIAGNOSIS — Z7901 Long term (current) use of anticoagulants: Secondary | ICD-10-CM

## 2016-05-07 LAB — CBC WITH DIFFERENTIAL/PLATELET
Basophils Absolute: 0 10*3/uL (ref 0–0.1)
Basophils Relative: 0 %
Eosinophils Absolute: 0.1 10*3/uL (ref 0–0.7)
Eosinophils Relative: 2 %
HCT: 29.7 % — ABNORMAL LOW (ref 40.0–52.0)
Hemoglobin: 9.9 g/dL — ABNORMAL LOW (ref 13.0–18.0)
Lymphocytes Relative: 17 %
Lymphs Abs: 0.8 10*3/uL — ABNORMAL LOW (ref 1.0–3.6)
MCH: 31.6 pg (ref 26.0–34.0)
MCHC: 33.3 g/dL (ref 32.0–36.0)
MCV: 94.8 fL (ref 80.0–100.0)
Monocytes Absolute: 0.4 10*3/uL (ref 0.2–1.0)
Monocytes Relative: 8 %
Neutro Abs: 3.4 10*3/uL (ref 1.4–6.5)
Neutrophils Relative %: 73 %
Platelets: 86 10*3/uL — ABNORMAL LOW (ref 150–440)
RBC: 3.14 MIL/uL — ABNORMAL LOW (ref 4.40–5.90)
RDW: 14.3 % (ref 11.5–14.5)
WBC: 4.6 10*3/uL (ref 3.8–10.6)

## 2016-05-07 LAB — COMPREHENSIVE METABOLIC PANEL
ALT: 18 U/L (ref 17–63)
AST: 20 U/L (ref 15–41)
Albumin: 3.7 g/dL (ref 3.5–5.0)
Alkaline Phosphatase: 64 U/L (ref 38–126)
Anion gap: 8 (ref 5–15)
BUN: 56 mg/dL — ABNORMAL HIGH (ref 6–20)
CO2: 21 mmol/L — ABNORMAL LOW (ref 22–32)
Calcium: 8.8 mg/dL — ABNORMAL LOW (ref 8.9–10.3)
Chloride: 109 mmol/L (ref 101–111)
Creatinine, Ser: 1.96 mg/dL — ABNORMAL HIGH (ref 0.61–1.24)
GFR calc Af Amer: 33 mL/min — ABNORMAL LOW (ref >60)
GFR calc non Af Amer: 29 mL/min — ABNORMAL LOW (ref >60)
Glucose, Bld: 117 mg/dL — ABNORMAL HIGH (ref 65–99)
Potassium: 4.2 mmol/L (ref 3.5–5.1)
Sodium: 138 mmol/L (ref 135–145)
Total Bilirubin: 0.5 mg/dL (ref 0.3–1.2)
Total Protein: 7.1 g/dL (ref 6.5–8.1)

## 2016-05-07 LAB — FERRITIN: Ferritin: 51 ng/mL (ref 24–336)

## 2016-05-07 NOTE — Progress Notes (Signed)
[facility] day:   05/07/2016   Chief Complaint: Alazar Cherian is an 83 y.o. male with anemia, thrombocytopenia, chronic renal insufficiency, and a monoclonal gammopathy who is seen for 3 month assessment on oral iron.  HPI: The patient was last seen in the medical oncology clinic on 01/27/2016.  At that time, he denied any complaint.  Exam revealed no adenopathy or hepatosplenomegaly. CBC revealed a hematocrit of 28.2, hemoglobin 9.3, MCV 95.6, platelets 72,000, WBC 5500 with an ANC of 4100.  Creatinine was 1.97 (CrCl 28 ml/min).  Epo level was 10.  We discussed consideration of initiation of Procrit.  During the interim, he states that he has felt well. His only complaint is "crickets in his ears". His brother states that he was taken to [facility] clinic and given nasal spray that seems to be helping with his ears. He describes intermittent "noise" with a loud "pop". This happens a few time per day. Patient will be referred to ENT by PCP if the nasal spray does not continue to help.   He denies any excess bruising or bleeding.  He denies any fevers or infections.   Past Medical History:  Diagnosis Date  . Arthritis   . BPH (benign prostatic hypertrophy)    with elevated PSA, s/p benign biopsy, followed yearly by [facility] (last seen 01/16/2013)  . CKD (chronic kidney disease) stage 3, GFR 30-59 ml/min    [place] -> 2017 requested PCP start following  . COPD (chronic obstructive pulmonary disease) ([place]) 2016   by xray  . Diabetes mellitus type 2, controlled ([place])   . Glaucoma   . Hearing loss   . HLD (hyperlipidemia)   . HTN (hypertension)   . IgM monoclonal gammopathy of uncertain significance 08/27/2014   07/2014 suggest rpt 6 mo.  . Malignant melanoma of left upper arm ([place]) 11/14/2014   s/p excision complicated by infected axillary seroma (Dr Carmin Muskrat)  . Melanoma ([place]) 2014   nose and left hand  . Secondary hyperparathyroidism  ([place])   . Subclavian artery stenosis, right ([place]) 2014   found on carotid US, seen [facility], recommended recheck 1 yr at vascular lab with Dr. Delana Meyer  . Thyroid nodule 02/2013   right upper lobe - s/p biopsy - benign follicular nodule  . Urine incontinence     Past Surgical History:  Procedure Laterality Date  . APPENDECTOMY  1938  . arm surgery     Right after trauma  . BIOPSY THYROID Right [id number]   benign follicular nodule  . carotid US  07/2007   WNL  . 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 US ECHOCARDIOGRAPHY  06/2007   EF 55%, mod dil LA, mild LVH,mild pulm HTN    Family History  Problem Relation Age of Onset  . Cancer Mother     stomach  . Cancer Brother     colon  . Cancer Brother     colon  . Cancer Sister     hodgkin's lymphoma  . Cancer Brother     prostate  . Diabetes Brother   . Coronary artery disease Neg Hx   . Stroke Neg Hx     Social History:  reports that he quit smoking about 49 years ago. His smoking use included Cigarettes. He has a 7.50 pack-year smoking history. He has never used smokeless tobacco. He reports that he does not drink alcohol or use drugs.  He lives in [place].  The patient is accompanied by his brother, [name].  Allergies: No Known Allergies  Current Medications: Current Outpatient Prescriptions  Medication Sig Dispense Refill  . amLODipine (NORVASC) 5 MG tablet TAKE 1 TABLET BY MOUTH ONCE A DAY 90 tablet 1  . Blood Glucose Monitoring Suppl (ONE TOUCH ULTRA 2) W/DEVICE KIT     . Casanthranol-Docusate Sodium 30-100 MG CAPS Take by mouth daily.    . dorzolamide-timolol (COSOPT) 22.3-6.8 MG/ML ophthalmic solution Place 1 drop into both eyes 2 (two) times daily. 10 mL   . doxazosin (CARDURA) 8 MG tablet TAKE 1 TABLET BY MOUTH DAILY AT BEDTIME 90 tablet 0  . ELIQUIS 2.5 MG TABS tablet TAKE 1 TABLET BY MOUTH TWICE (2) DAILY 180 tablet 1  . ferrous sulfate 325 (65 FE) MG tablet TAKE 1  TABLET BY MOUTH ONCE A DAY WITH BREAKFAST. 90 tablet 1  . furosemide (LASIX) 20 MG tablet TAKE 1 TABLET BY MOUTH DAILY 30 tablet 6  . glucose blood (ONE TOUCH ULTRA TEST) test strip USE TO CHECK BLOOD SUGAR 3 TIMES DAILY Dx: E11.42 300 each 2  . latanoprost (XALATAN) 0.005 % ophthalmic solution Place 1 drop into both eyes at bedtime. 2.5 mL   . lisinopril-hydrochlorothiazide (PRINZIDE,ZESTORETIC) 20-25 MG tablet TAKE 1 TABLET BY MOUTH ONCE A DAY 90 tablet 1  . Multiple Vitamins-Minerals (PRESERVISION AREDS) CAPS Take 2 capsules by mouth daily.    Glory Rosebush DELICA LANCETS MISC Use as directed    . potassium chloride (K-DUR) 10 MEQ tablet TAKE 1 TABLET BY MOUTH DAILY AS NEEDED 30 tablet 3  . pravastatin (PRAVACHOL) 40 MG tablet TAKE 1 TABLET BY MOUTH DAILY AT BEDTIME 90 tablet 0  . Vitamin D, Ergocalciferol, (DRISDOL) 50000 units CAPS capsule Take 50,000 Units by mouth every 30 (thirty) days.     No current facility-administered medications for this visit.     Review of Systems:  GENERAL:  Feels well.  No fevers or sweats .  Weight down 2 pounds. PERFORMANCE STATUS (ECOG):  1 HEENT:  Hard of hearing.  "Crickets in ears".  Nose drips.  No visual changes, runny nose, sore throat, mouth sores or tenderness. Lungs: No shortness of breath or cough.  No hemoptysis. Cardiac:  No chest pain, palpitations, orthopnea, or PND. GI:  No nausea, vomiting, diarrhea, constipation, melena or hematochezia. GU:  No urgency, frequency, dysuria, or hematuria. Musculoskeletal:  No back pain.  Arthritis pain.  No muscle tenderness. Extremities:  No pain or swelling. Skin:  Melanoma s/p removal.  No rashes or skin changes. Neuro:  No headache, numbness or weakness, balance or coordination issues. Endocrine:  No diabetes, thyroid issues, hot flashes or night sweats. Psych:  No mood changes, depression or anxiety. Pain:  No focal pain. Review of systems:  All other systems reviewed and found to be negative.    Physical Exam: 163 Blood pressure (!) 144/76, pulse 71, temperature (!) 96.1 F (35.6 C), temperature source Tympanic, resp. rate 18, weight 162 lb 0.6 oz (73.5 kg). GENERAL:  Well developed, well nourished, elderly gentleman sitting comfortably in the exam room in no acute distress. MENTAL STATUS:  Alert and oriented to person, place and time. HEAD:  Wearing acap.  Gray hair.  Male pattern baldness.  Normocephalic, atraumatic, face symmetric, no Cushingoid features. EYES:  Glasses.  Hazel/brown eyes.  Pupils equal round and reactive to light and accomodation.  No conjunctivitis or scleral icterus. ENT:  Hearing aides.  Oropharynx clear without lesion.  Tongue normal. Mucous membranes moist.  RESPIRATORY:  Clear to auscultation without rales, wheezes or rhonchi. CARDIOVASCULAR:  Regular rate and rhythm without murmur, rub or gallop. ABDOMEN:  Ventral hernia.  Soft, non-tender, with active bowel sounds, and no hepatosplenomegaly.  No masses. SKIN:  Changes due to sun exposure.  No rashes, ulcers or lesions. EXTREMITIES: Trace ankle edema.  No skin discoloration or tenderness.  No palpable cords. LYMPH NODES: No palpable cervical, supraclavicular, axillary or inguinal adenopathy  NEUROLOGICAL: Unremarkable. PSYCH:  Appropriate.    Appointment on 05/07/2016  Component Date Value Ref Range Status  . WBC 05/07/2016 4.6  3.8 - 10.6 K/uL Final  . RBC 05/07/2016 3.14* 4.40 - 5.90 MIL/uL Final  . Hemoglobin 05/07/2016 9.9* 13.0 - 18.0 g/dL Final  . HCT 05/07/2016 29.7* 40.0 - 52.0 % Final  . MCV 05/07/2016 94.8  80.0 - 100.0 fL Final  . MCH 05/07/2016 31.6  26.0 - 34.0 pg Final  . MCHC 05/07/2016 33.3  32.0 - 36.0 g/dL Final  . RDW 05/07/2016 14.3  11.5 - 14.5 % Final  . Platelets 05/07/2016 86* 150 - 440 K/uL Final  . Neutrophils Relative % 05/07/2016 73  % Final  . Neutro Abs 05/07/2016 3.4  1.4 - 6.5 K/uL Final  . Lymphocytes Relative 05/07/2016 17  % Final  . Lymphs Abs 05/07/2016 0.8*  1.0 - 3.6 K/uL Final  . Monocytes Relative 05/07/2016 8  % Final  . Monocytes Absolute 05/07/2016 0.4  0.2 - 1.0 K/uL Final  . Eosinophils Relative 05/07/2016 2  % Final  . Eosinophils Absolute 05/07/2016 0.1  0 - 0.7 K/uL Final  . Basophils Relative 05/07/2016 0  % Final  . Basophils Absolute 05/07/2016 0.0  0 - 0.1 K/uL Final  . Sodium 05/07/2016 138  135 - 145 mmol/L Final  . Potassium 05/07/2016 4.2  3.5 - 5.1 mmol/L Final  . Chloride 05/07/2016 109  101 - 111 mmol/L Final  . CO2 05/07/2016 21* 22 - 32 mmol/L Final  . Glucose, Bld 05/07/2016 117* 65 - 99 mg/dL Final  . BUN 05/07/2016 56* 6 - 20 mg/dL Final  . Creatinine, Ser 05/07/2016 1.96* 0.61 - 1.24 mg/dL Final  . Calcium 05/07/2016 8.8* 8.9 - 10.3 mg/dL Final  . Total Protein 05/07/2016 7.1  6.5 - 8.1 g/dL Final  . Albumin 05/07/2016 3.7  3.5 - 5.0 g/dL Final  . AST 05/07/2016 20  15 - 41 U/L Final  . ALT 05/07/2016 18  17 - 63 U/L Final  . Alkaline Phosphatase 05/07/2016 64  38 - 126 U/L Final  . Total Bilirubin 05/07/2016 0.5  0.3 - 1.2 mg/dL Final  . GFR calc non Af Amer 05/07/2016 29* >60 mL/min Final  . GFR calc Af Amer 05/07/2016 33* >60 mL/min Final   Comment: (NOTE) The eGFR has been calculated using the CKD EPI equation. This calculation has not been validated in all clinical situations. eGFR's persistently <60 mL/min signify possible Chronic Kidney Disease.   . Anion gap 05/07/2016 8  5 - 15 Final  . Ferritin 05/07/2016 51  24 - 336 ng/mL Final    Assessment:  Lowen Barringer is an 83 y.o. male with mild cytopenias likely multi-factorial in nature (renal insufficiency and anemia of chronic disease).  He has a 5 year history of a normocytic anemia and thrombocytopenia.  Hematocrit has ranged between 28-32.  Platelet count has ranged between 75,000-85,000.  He has a monoclonal gammopathy of unknown significance (MGUS).  Kappa free  light chains were 147.45, lambda free light chains 32.32, with a kappa lambda  ratio of 4.56 (0.26-1.65).  Immunoglobulin levels were normal.  24 hour UPEP revealed no monoclonal protein and a slightly elevated kappa/lambda ratio (13.6).  SPEP has been followed:  0.3 gm/dL on 08/15/2014, 0.4 gm/dL on 09/11/2014, and 0.3 gm/dL on 05/07/2016.  He has chronic renal insufficiency (Cr 1.7-2.03) over the past 2 years without trend.  He denies any new medications or herbal products.   Peripheral smear on 08/27/2014 revealed no abnormal white blood cells, thrombocytopenia without clumping and some large platelets. There were elliptocytes, teardrop red blood cells, and schistocytes. Teardrop RBCs and elliptocytes suggest possible extramedullary hematopoiesis (myelofibrosis).  Myelodysplastic syndrome with deletion 20q is a consideration.  Work-up on 09/11/2014 included CBC with a hematocrit 30.4, hemoglobin 9.7, MCV 95.1, platelets 80,000, white count 5300 with an [place] of 3800. Differential was unremarkable.  B12, folate, TSH, ANA, PT, PTT, LDH.  Reticulocyte count was low at 3.19%. Ferritin was 47 with a iron saturation of 20% and a TIBC of 265 (normal).  Bone survey on 10/03/2014 revealed subtle lucencies within the calvarium, left femoral head, and proximal left tibia which could reflect myelomatous involvement.  Bone survey on 10/03/2015 revealed no definite radiolucent lesions. There was diffuse osteopenia.  There were multiple old healed right lateral rib fractures.  Bone marrow aspirate and biopsy on 10/15/2015 revealed no significant dysplasia or abnormal or overt neoplasia. Marrow was slightly hypercellular for age (40%) with maturing trilineage hematopoiesis, relatively diminished erythroid precursors, and adequate megakaryocytes. There was marginally increased polytypic plasma cells (5%). There was diffuse mild increase in reticulin. There were trace iron stores.  Flow cytometry revealed a low level monoclonal B population (1%) with nonspecific immunophenotype.  Cytogenetics were  normal (46, XY) with a normal SNP microarray.   He rarely eats meat.  He denies any melena or hematochezia.  He has never had a colonoscopy.  Guaiac cards were negative 2 on 09/16/2014.   Symptomatically, he complains only of "crickets in his ears".  Exam reveals no adenopathy or hepatosplenomegaly.  Counts are stable.  Hematocrit is 29.7 and hemoglobin 9.9.  Ferritin is 51.    Plan: 1.  Labs today:  CBC with diff, CMP, ferritin, SPEP, free light chains. 2.  RTC in 3 months for labs (CBC with diff, ferritin). 3.  RTC in 6 months for MD assessment and labs (CBC with diff, CMP, ferritin, SPEP, free light chains).  The patient was seen and examined.  The assessment and plan was discussed with the patient and his brother.  A few questions were asked and answered.   Faythe Casa, NP  Lequita Asal, MD  05/07/2016, 4:49 PM

## 2016-05-07 NOTE — Progress Notes (Signed)
Patient offers no complaints today. 

## 2016-05-08 LAB — KAPPA/LAMBDA LIGHT CHAINS
Kappa free light chain: 153.6 mg/L — ABNORMAL HIGH (ref 3.3–19.4)
Kappa, lambda light chain ratio: 3.35 — ABNORMAL HIGH (ref 0.26–1.65)
Lambda free light chains: 45.9 mg/L — ABNORMAL HIGH (ref 5.7–26.3)

## 2016-05-11 LAB — PROTEIN ELECTROPHORESIS, SERUM
A/G Ratio: 1.2 (ref 0.7–1.7)
Albumin ELP: 3.7 g/dL (ref 2.9–4.4)
Alpha-1-Globulin: 0.2 g/dL (ref 0.0–0.4)
Alpha-2-Globulin: 0.7 g/dL (ref 0.4–1.0)
Beta Globulin: 0.9 g/dL (ref 0.7–1.3)
Gamma Globulin: 1.2 g/dL (ref 0.4–1.8)
Globulin, Total: 3 g/dL (ref 2.2–3.9)
M-Spike, %: 0.3 g/dL — ABNORMAL HIGH
Total Protein ELP: 6.7 g/dL (ref 6.0–8.5)

## 2016-05-14 ENCOUNTER — Other Ambulatory Visit: Payer: Self-pay | Admitting: Family Medicine

## 2016-05-18 DIAGNOSIS — I129 Hypertensive chronic kidney disease with stage 1 through stage 4 chronic kidney disease, or unspecified chronic kidney disease: Secondary | ICD-10-CM | POA: Diagnosis not present

## 2016-05-18 DIAGNOSIS — N183 Chronic kidney disease, stage 3 (moderate): Secondary | ICD-10-CM | POA: Diagnosis not present

## 2016-05-18 DIAGNOSIS — E1122 Type 2 diabetes mellitus with diabetic chronic kidney disease: Secondary | ICD-10-CM | POA: Diagnosis not present

## 2016-05-18 DIAGNOSIS — N2581 Secondary hyperparathyroidism of renal origin: Secondary | ICD-10-CM | POA: Diagnosis not present

## 2016-05-19 ENCOUNTER — Telehealth: Payer: Self-pay | Admitting: Internal Medicine

## 2016-05-19 DIAGNOSIS — H9201 Otalgia, right ear: Secondary | ICD-10-CM

## 2016-05-19 NOTE — Telephone Encounter (Signed)
Patient was seen a couple weeks ago by Rollene Fare for his ear.  Patient was told to call back, if his ear wasn't better.  Patient said his ear hasn't improved at all.  He would like to be referred to an ENT.  Patient prefers [facility] and he said he can go anytime.

## 2016-05-19 NOTE — Telephone Encounter (Signed)
Referral placed.

## 2016-05-19 NOTE — Addendum Note (Signed)
Addended by: Jearld Fenton on: 05/19/2016 03:47 PM   Modules accepted: Orders

## 2016-05-21 ENCOUNTER — Other Ambulatory Visit: Payer: Self-pay | Admitting: Family Medicine

## 2016-05-25 DIAGNOSIS — H6123 Impacted cerumen, bilateral: Secondary | ICD-10-CM | POA: Diagnosis not present

## 2016-05-25 DIAGNOSIS — H6501 Acute serous otitis media, right ear: Secondary | ICD-10-CM | POA: Diagnosis not present

## 2016-05-25 DIAGNOSIS — H9311 Tinnitus, right ear: Secondary | ICD-10-CM | POA: Diagnosis not present

## 2016-05-25 DIAGNOSIS — H6981 Other specified disorders of Eustachian tube, right ear: Secondary | ICD-10-CM | POA: Diagnosis not present

## 2016-06-09 DIAGNOSIS — C4362 Malignant melanoma of left upper limb, including shoulder: Secondary | ICD-10-CM | POA: Diagnosis not present

## 2016-06-23 ENCOUNTER — Ambulatory Visit (INDEPENDENT_AMBULATORY_CARE_PROVIDER_SITE_OTHER): Payer: Medicare Other | Admitting: Family Medicine

## 2016-06-23 ENCOUNTER — Encounter: Payer: Self-pay | Admitting: Family Medicine

## 2016-06-23 ENCOUNTER — Encounter (INDEPENDENT_AMBULATORY_CARE_PROVIDER_SITE_OTHER): Payer: Self-pay

## 2016-06-23 VITALS — BP 112/64 | HR 64 | Temp 97.9°F | Wt 164.0 lb

## 2016-06-23 DIAGNOSIS — H9193 Unspecified hearing loss, bilateral: Secondary | ICD-10-CM | POA: Diagnosis not present

## 2016-06-23 DIAGNOSIS — I4891 Unspecified atrial fibrillation: Secondary | ICD-10-CM | POA: Diagnosis not present

## 2016-06-23 DIAGNOSIS — E1142 Type 2 diabetes mellitus with diabetic polyneuropathy: Secondary | ICD-10-CM | POA: Diagnosis not present

## 2016-06-23 NOTE — Progress Notes (Signed)
Pre visit review using our clinic review tool, if applicable. No additional management support is needed unless otherwise documented below in the visit note. 

## 2016-06-23 NOTE — Patient Instructions (Addendum)
Continue eliquis 2.5mg  twice daily.  Good to see you today. Continue current medicines. Return in 3 months for medicare wellness with Katha Cabal and physical with me.

## 2016-06-23 NOTE — Progress Notes (Signed)
BP 112/64   Pulse 64   Temp 97.9 F (36.6 C) (Oral)   Wt 164 lb (74.4 kg)   BMI 27.29 kg/m    CC: 3 mo f/u visit Subjective:    Patient ID: Charles Rodgers, male    DOB: 03-08-1925, 83 y.o.   MRN: [id number]  HPI: Charles Rodgers is a 83 y.o. male presenting on 06/23/2016 for Follow-up   See prior note for details. Newly found atrial fibrillation with slowed ventricular response, found during CT bone marrow aspiration/biopsy 09/2015, at that time declined cardiology referral and was started on eliquis 2.61m bid. CHADSVASC2 score = 4. Denies chest pain, tightness, dyspnea, palpitations, dizziness.   Followed by Dr CMike Gipfor multifactorial cytopenias.   Sugars doing well - 125 this morning fasting.  Recent cerumen removal by ENT this past month.   Relevant past medical, surgical, family and social history reviewed and updated as indicated. Interim medical history since our last visit reviewed. Allergies and medications reviewed and updated. Outpatient Medications Prior to Visit  Medication Sig Dispense Refill  . amLODipine (NORVASC) 5 MG tablet TAKE 1 TABLET BY MOUTH ONCE A DAY 90 tablet 1  . Blood Glucose Monitoring Suppl (ONE TOUCH ULTRA 2) W/DEVICE KIT     . Casanthranol-Docusate Sodium 30-100 MG CAPS Take by mouth daily.    . dorzolamide-timolol (COSOPT) 22.3-6.8 MG/ML ophthalmic solution Place 1 drop into both eyes 2 (two) times daily. 10 mL   . doxazosin (CARDURA) 8 MG tablet TAKE 1 TABLET BY MOUTH DAILY AT BEDTIME 90 tablet 1  . ELIQUIS 2.5 MG TABS tablet TAKE 1 TABLET BY MOUTH TWICE (2) DAILY 180 tablet 1  . furosemide (LASIX) 20 MG tablet TAKE 1 TABLET BY MOUTH DAILY 30 tablet 6  . glucose blood (ONE TOUCH ULTRA TEST) test strip USE TO CHECK BLOOD SUGAR 3 TIMES DAILY Dx: E11.42 300 each 2  . latanoprost (XALATAN) 0.005 % ophthalmic solution Place 1 drop into both eyes at bedtime. 2.5 mL   . lisinopril-hydrochlorothiazide (PRINZIDE,ZESTORETIC) 20-25 MG  tablet TAKE 1 TABLET BY MOUTH ONCE A DAY 90 tablet 1  . Multiple Vitamins-Minerals (PRESERVISION AREDS) CAPS Take 2 capsules by mouth daily.    .Glory RosebushDELICA LANCETS MISC Use as directed    . potassium chloride (K-DUR) 10 MEQ tablet TAKE 1 TABLET BY MOUTH DAILY AS NEEDED 30 tablet 3  . pravastatin (PRAVACHOL) 40 MG tablet TAKE 1 TABLET BY MOUTH AT BEDTIME 90 tablet 0  . Vitamin D, Ergocalciferol, (DRISDOL) 50000 units CAPS capsule Take 50,000 Units by mouth every 30 (thirty) days.    . ferrous sulfate 325 (65 FE) MG tablet TAKE 1 TABLET BY MOUTH ONCE A DAY WITH BREAKFAST. (Patient not taking: Reported on 06/23/2016) 90 tablet 1   No facility-administered medications prior to visit.      Per HPI unless specifically indicated in ROS section below Review of Systems     Objective:    BP 112/64   Pulse 64   Temp 97.9 F (36.6 C) (Oral)   Wt 164 lb (74.4 kg)   BMI 27.29 kg/m   Wt Readings from Last 3 Encounters:  06/23/16 164 lb (74.4 kg)  05/07/16 162 lb 0.6 oz (73.5 kg)  05/06/16 164 lb (74.4 kg)    Physical Exam  Constitutional: He appears well-developed and well-nourished. No distress.  HENT:  Mouth/Throat: Oropharynx is clear and moist. No oropharyngeal exudate.  Cardiovascular: Normal rate, normal heart sounds and intact distal pulses.  An irregular rhythm present.  No murmur heard. Pulmonary/Chest: Effort normal and breath sounds normal. No respiratory distress. He has no wheezes. He has no rales.  Musculoskeletal: He exhibits edema (tr).  Skin: Skin is warm and dry. No rash noted.  Psychiatric: He has a normal mood and affect.  Nursing note and vitals reviewed.  Results for orders placed or performed in visit on 05/07/16  CBC with Differential  Result Value Ref Range   WBC 4.6 3.8 - 10.6 K/uL   RBC 3.14 (L) 4.40 - 5.90 MIL/uL   Hemoglobin 9.9 (L) 13.0 - 18.0 g/dL   HCT 29.7 (L) 40.0 - 52.0 %   MCV 94.8 80.0 - 100.0 fL   MCH 31.6 26.0 - 34.0 pg   MCHC 33.3 32.0 -  36.0 g/dL   RDW 14.3 11.5 - 14.5 %   Platelets 86 (L) 150 - 440 K/uL   Neutrophils Relative % 73 %   Neutro Abs 3.4 1.4 - 6.5 K/uL   Lymphocytes Relative 17 %   Lymphs Abs 0.8 (L) 1.0 - 3.6 K/uL   Monocytes Relative 8 %   Monocytes Absolute 0.4 0.2 - 1.0 K/uL   Eosinophils Relative 2 %   Eosinophils Absolute 0.1 0 - 0.7 K/uL   Basophils Relative 0 %   Basophils Absolute 0.0 0 - 0.1 K/uL  Comprehensive metabolic panel  Result Value Ref Range   Sodium 138 135 - 145 mmol/L   Potassium 4.2 3.5 - 5.1 mmol/L   Chloride 109 101 - 111 mmol/L   CO2 21 (L) 22 - 32 mmol/L   Glucose, Bld 117 (H) 65 - 99 mg/dL   BUN 56 (H) 6 - 20 mg/dL   Creatinine, Ser 1.96 (H) 0.61 - 1.24 mg/dL   Calcium 8.8 (L) 8.9 - 10.3 mg/dL   Total Protein 7.1 6.5 - 8.1 g/dL   Albumin 3.7 3.5 - 5.0 g/dL   AST 20 15 - 41 U/L   ALT 18 17 - 63 U/L   Alkaline Phosphatase 64 38 - 126 U/L   Total Bilirubin 0.5 0.3 - 1.2 mg/dL   GFR calc non Af Amer 29 (L) >60 mL/min   GFR calc Af Amer 33 (L) >60 mL/min   Anion gap 8 5 - 15  Ferritin  Result Value Ref Range   Ferritin 51 24 - 336 ng/mL  Protein electrophoresis, serum  Result Value Ref Range   Total Protein ELP 6.7 6.0 - 8.5 g/dL   Albumin ELP 3.7 2.9 - 4.4 g/dL   Alpha-1-Globulin 0.2 0.0 - 0.4 g/dL   Alpha-2-Globulin 0.7 0.4 - 1.0 g/dL   Beta Globulin 0.9 0.7 - 1.3 g/dL   Gamma Globulin 1.2 0.4 - 1.8 g/dL   M-Spike, % 0.3 (H) Not Observed g/dL   SPE Interp. Comment    Comment Comment    GLOBULIN, TOTAL 3.0 2.2 - 3.9 g/dL   A/G Ratio 1.2 0.7 - 1.7  Kappa/lambda light chains  Result Value Ref Range   Kappa free light chain 153.6 (H) 3.3 - 19.4 mg/L   Lamda free light chains 45.9 (H) 5.7 - 26.3 mg/L   Kappa, lamda light chain ratio 3.35 (H) 0.26 - 1.65   Lab Results  Component Value Date   HGBA1C 6.7 (H) 08/27/2015       Assessment & Plan:   Problem List Items Addressed This Visit    Atrial fibrillation with slow ventricular response ([place]) - Primary     Mildly irregular today.  Continue eliquis as tolerating well. Discussed reasoning behind starting eliquis.  $45/month. Worried about donut hole - at which point med will cost him close to $500/mo. May need to transition to coumadin at that time.       Hearing loss    Ongoing difficulty despite L hearing aide. He did have ears cleared by ENT last week.       Well controlled type 2 diabetes mellitus with peripheral neuropathy ([place])    Endorses good readings recently. Remains diet controlled. Lab Results  Component Value Date   HGBA1C 6.7 (H) 08/27/2015            Follow up plan: Return in about 3 months (around 09/23/2016) for medicare wellness visit, annual exam, prior fasting for blood work.  [name], MD

## 2016-06-24 NOTE — Assessment & Plan Note (Signed)
Endorses good readings recently. Remains diet controlled. Lab Results  Component Value Date   HGBA1C 6.7 (H) 08/27/2015

## 2016-06-24 NOTE — Assessment & Plan Note (Addendum)
Mildly irregular today. Continue eliquis as tolerating well. Discussed reasoning behind starting eliquis.  $45/month. Worried about donut hole - at which point med will cost him close to $500/mo. May need to transition to coumadin at that time.

## 2016-06-24 NOTE — Assessment & Plan Note (Signed)
Ongoing difficulty despite L hearing aide. He did have ears cleared by ENT last week.

## 2016-07-13 ENCOUNTER — Other Ambulatory Visit: Payer: Self-pay | Admitting: Family Medicine

## 2016-08-06 ENCOUNTER — Inpatient Hospital Stay: Payer: Medicare Other | Attending: Hematology and Oncology

## 2016-08-06 DIAGNOSIS — I129 Hypertensive chronic kidney disease with stage 1 through stage 4 chronic kidney disease, or unspecified chronic kidney disease: Secondary | ICD-10-CM | POA: Insufficient documentation

## 2016-08-06 DIAGNOSIS — Z87891 Personal history of nicotine dependence: Secondary | ICD-10-CM | POA: Diagnosis not present

## 2016-08-06 DIAGNOSIS — N2581 Secondary hyperparathyroidism of renal origin: Secondary | ICD-10-CM | POA: Diagnosis not present

## 2016-08-06 DIAGNOSIS — R634 Abnormal weight loss: Secondary | ICD-10-CM | POA: Insufficient documentation

## 2016-08-06 DIAGNOSIS — Z8042 Family history of malignant neoplasm of prostate: Secondary | ICD-10-CM | POA: Diagnosis not present

## 2016-08-06 DIAGNOSIS — E1122 Type 2 diabetes mellitus with diabetic chronic kidney disease: Secondary | ICD-10-CM | POA: Diagnosis not present

## 2016-08-06 DIAGNOSIS — Z85820 Personal history of malignant melanoma of skin: Secondary | ICD-10-CM | POA: Diagnosis not present

## 2016-08-06 DIAGNOSIS — H9190 Unspecified hearing loss, unspecified ear: Secondary | ICD-10-CM | POA: Diagnosis not present

## 2016-08-06 DIAGNOSIS — J449 Chronic obstructive pulmonary disease, unspecified: Secondary | ICD-10-CM | POA: Diagnosis not present

## 2016-08-06 DIAGNOSIS — E785 Hyperlipidemia, unspecified: Secondary | ICD-10-CM | POA: Diagnosis not present

## 2016-08-06 DIAGNOSIS — Z7901 Long term (current) use of anticoagulants: Secondary | ICD-10-CM | POA: Diagnosis not present

## 2016-08-06 DIAGNOSIS — Z800 Family history of malignant neoplasm of digestive organs: Secondary | ICD-10-CM | POA: Insufficient documentation

## 2016-08-06 DIAGNOSIS — Z79899 Other long term (current) drug therapy: Secondary | ICD-10-CM | POA: Diagnosis not present

## 2016-08-06 DIAGNOSIS — N183 Chronic kidney disease, stage 3 (moderate): Secondary | ICD-10-CM | POA: Diagnosis not present

## 2016-08-06 DIAGNOSIS — R5383 Other fatigue: Secondary | ICD-10-CM | POA: Insufficient documentation

## 2016-08-06 DIAGNOSIS — D696 Thrombocytopenia, unspecified: Secondary | ICD-10-CM

## 2016-08-06 DIAGNOSIS — H409 Unspecified glaucoma: Secondary | ICD-10-CM | POA: Insufficient documentation

## 2016-08-06 DIAGNOSIS — D472 Monoclonal gammopathy: Secondary | ICD-10-CM | POA: Insufficient documentation

## 2016-08-06 DIAGNOSIS — N184 Chronic kidney disease, stage 4 (severe): Secondary | ICD-10-CM

## 2016-08-06 DIAGNOSIS — D631 Anemia in chronic kidney disease: Secondary | ICD-10-CM | POA: Diagnosis not present

## 2016-08-06 LAB — CBC WITH DIFFERENTIAL/PLATELET
Basophils Absolute: 0 10*3/uL (ref 0–0.1)
Basophils Relative: 0 %
Eosinophils Absolute: 0.1 10*3/uL (ref 0–0.7)
Eosinophils Relative: 1 %
HCT: 26.8 % — ABNORMAL LOW (ref 40.0–52.0)
Hemoglobin: 9 g/dL — ABNORMAL LOW (ref 13.0–18.0)
Lymphocytes Relative: 17 %
Lymphs Abs: 0.9 10*3/uL — ABNORMAL LOW (ref 1.0–3.6)
MCH: 31.9 pg (ref 26.0–34.0)
MCHC: 33.6 g/dL (ref 32.0–36.0)
MCV: 95.1 fL (ref 80.0–100.0)
Monocytes Absolute: 0.5 10*3/uL (ref 0.2–1.0)
Monocytes Relative: 9 %
Neutro Abs: 3.7 10*3/uL (ref 1.4–6.5)
Neutrophils Relative %: 73 %
Platelets: 69 10*3/uL — ABNORMAL LOW (ref 150–440)
RBC: 2.82 MIL/uL — ABNORMAL LOW (ref 4.40–5.90)
RDW: 14.5 % (ref 11.5–14.5)
WBC: 5.1 10*3/uL (ref 3.8–10.6)

## 2016-08-06 LAB — FERRITIN: Ferritin: 48 ng/mL (ref 24–336)

## 2016-08-07 ENCOUNTER — Other Ambulatory Visit: Payer: Self-pay | Admitting: *Deleted

## 2016-08-07 ENCOUNTER — Telehealth: Payer: Self-pay | Admitting: *Deleted

## 2016-08-07 DIAGNOSIS — D472 Monoclonal gammopathy: Secondary | ICD-10-CM

## 2016-08-07 NOTE — Telephone Encounter (Signed)
Called patient to inform him that his counts have dropped.  MD wants to do repeat labs and see him in one week.  We will have a scheduler call him with appointment date/time. Patient also may need a bone marrow.  MD will discuss with him at appointment next week.    Sent scheduler a message to add patient for lab/MD.

## 2016-08-07 NOTE — Telephone Encounter (Signed)
-----   Message from Lequita Asal, MD sent at 08/07/2016  3:44 AM EDT ----- Regarding: Please call patient  Counts have dropped.  Let's see him in 1 week plus additional labs:  CBC, iron studies, B12, folate, TSH epo level, retic, ANA.  He may need a bone marrow  M   ----- Message ----- From: Interface, Lab In [place] Sent: 08/06/2016   1:51 PM To: Lequita Asal, MD

## 2016-08-12 ENCOUNTER — Other Ambulatory Visit: Payer: Self-pay | Admitting: Family Medicine

## 2016-08-17 ENCOUNTER — Inpatient Hospital Stay (HOSPITAL_BASED_OUTPATIENT_CLINIC_OR_DEPARTMENT_OTHER): Payer: Medicare Other | Admitting: Hematology and Oncology

## 2016-08-17 ENCOUNTER — Inpatient Hospital Stay: Payer: Medicare Other | Admitting: *Deleted

## 2016-08-17 VITALS — BP 132/67 | HR 70 | Temp 99.7°F | Resp 18 | Wt 157.0 lb

## 2016-08-17 DIAGNOSIS — E1122 Type 2 diabetes mellitus with diabetic chronic kidney disease: Secondary | ICD-10-CM

## 2016-08-17 DIAGNOSIS — E785 Hyperlipidemia, unspecified: Secondary | ICD-10-CM | POA: Diagnosis not present

## 2016-08-17 DIAGNOSIS — D631 Anemia in chronic kidney disease: Secondary | ICD-10-CM | POA: Diagnosis not present

## 2016-08-17 DIAGNOSIS — N183 Chronic kidney disease, stage 3 (moderate): Secondary | ICD-10-CM | POA: Diagnosis not present

## 2016-08-17 DIAGNOSIS — Z85820 Personal history of malignant melanoma of skin: Secondary | ICD-10-CM | POA: Diagnosis not present

## 2016-08-17 DIAGNOSIS — R634 Abnormal weight loss: Secondary | ICD-10-CM | POA: Diagnosis not present

## 2016-08-17 DIAGNOSIS — D696 Thrombocytopenia, unspecified: Secondary | ICD-10-CM

## 2016-08-17 DIAGNOSIS — Z87891 Personal history of nicotine dependence: Secondary | ICD-10-CM

## 2016-08-17 DIAGNOSIS — R5383 Other fatigue: Secondary | ICD-10-CM

## 2016-08-17 DIAGNOSIS — Z7901 Long term (current) use of anticoagulants: Secondary | ICD-10-CM | POA: Diagnosis not present

## 2016-08-17 DIAGNOSIS — J449 Chronic obstructive pulmonary disease, unspecified: Secondary | ICD-10-CM

## 2016-08-17 DIAGNOSIS — N2581 Secondary hyperparathyroidism of renal origin: Secondary | ICD-10-CM | POA: Diagnosis not present

## 2016-08-17 DIAGNOSIS — Z8042 Family history of malignant neoplasm of prostate: Secondary | ICD-10-CM | POA: Diagnosis not present

## 2016-08-17 DIAGNOSIS — D472 Monoclonal gammopathy: Secondary | ICD-10-CM | POA: Diagnosis not present

## 2016-08-17 DIAGNOSIS — I129 Hypertensive chronic kidney disease with stage 1 through stage 4 chronic kidney disease, or unspecified chronic kidney disease: Secondary | ICD-10-CM

## 2016-08-17 DIAGNOSIS — H409 Unspecified glaucoma: Secondary | ICD-10-CM

## 2016-08-17 DIAGNOSIS — Z800 Family history of malignant neoplasm of digestive organs: Secondary | ICD-10-CM

## 2016-08-17 DIAGNOSIS — Z79899 Other long term (current) drug therapy: Secondary | ICD-10-CM | POA: Diagnosis not present

## 2016-08-17 DIAGNOSIS — H9190 Unspecified hearing loss, unspecified ear: Secondary | ICD-10-CM

## 2016-08-17 DIAGNOSIS — N184 Chronic kidney disease, stage 4 (severe): Secondary | ICD-10-CM

## 2016-08-17 LAB — CBC WITH DIFFERENTIAL/PLATELET
Basophils Absolute: 0 10*3/uL (ref 0–0.1)
Basophils Relative: 0 %
Eosinophils Absolute: 0 10*3/uL (ref 0–0.7)
Eosinophils Relative: 1 %
HCT: 28.8 % — ABNORMAL LOW (ref 40.0–52.0)
Hemoglobin: 9.5 g/dL — ABNORMAL LOW (ref 13.0–18.0)
Lymphocytes Relative: 14 %
Lymphs Abs: 0.8 10*3/uL — ABNORMAL LOW (ref 1.0–3.6)
MCH: 32 pg (ref 26.0–34.0)
MCHC: 33 g/dL (ref 32.0–36.0)
MCV: 96.9 fL (ref 80.0–100.0)
Monocytes Absolute: 0.4 10*3/uL (ref 0.2–1.0)
Monocytes Relative: 7 %
Neutro Abs: 4.6 10*3/uL (ref 1.4–6.5)
Neutrophils Relative %: 78 %
Platelets: 71 10*3/uL — ABNORMAL LOW (ref 150–440)
RBC: 2.98 MIL/uL — ABNORMAL LOW (ref 4.40–5.90)
RDW: 14.2 % (ref 11.5–14.5)
WBC: 5.9 10*3/uL (ref 3.8–10.6)

## 2016-08-17 LAB — FOLATE: Folate: 27 ng/mL (ref >5.9)

## 2016-08-17 LAB — VITAMIN B12: Vitamin B-12: 626 pg/mL (ref 180–914)

## 2016-08-17 LAB — RETICULOCYTES
RBC.: 3.02 MIL/uL — ABNORMAL LOW (ref 4.40–5.90)
Retic Count, Absolute: 24.2 10*3/uL (ref 19.0–183.0)
Retic Ct Pct: 0.8 % (ref 0.4–3.1)

## 2016-08-17 NOTE — Progress Notes (Signed)
Patient offers no complaints today. 

## 2016-08-17 NOTE — Progress Notes (Signed)
[facility] day:   08/17/2016   Chief Complaint: Charles Rodgers is an 83 y.o. male with anemia, thrombocytopenia, chronic renal insufficiency, and a monoclonal gammopathy who is seen for 3 month assessment on oral iron.  HPI: The patient was last seen in the medical oncology clinic on 05/07/2016.  At that time, CBC revealed a hematocrit of 29.7, hemoglobin 9.9, platelets 86,000, WBC 4600 with an ANC of 3400.  SPEP revealed an M-spike of 0.3 gm/dL.  Kappa free light chains were 153.6, lambda free light chains 45.9, ratio of 3.35.  Creatinine was 1.96.  Ferritin was 51.  Symptomatically, he notes "slow energy".  He does very little, but keeps going.  He denies any chest pain or shortness of breath.  He lost his hearing aide.  He stopped Meals on Wheels.  His wife has Alzheimer's and doesn't cook anymore.  He eats soup.  His daughter, Charles Rodgers, brings food over.  He denies any melena or hematochezia.  CBC on 08/06/2016 revealed a hematocrit of 26.8, hemoglobin 9.0, platelets 69,000, WBC 51000 with an [place] of 3700.  Ferritin was 49.   Past Medical History:  Diagnosis Date  . Arthritis   . BPH (benign prostatic hypertrophy)    with elevated PSA, s/p benign biopsy, followed yearly by [facility] (last seen 01/16/2013)  . CKD (chronic kidney disease) stage 3, GFR 30-59 ml/min    [place] -> 2017 requested PCP start following  . COPD (chronic obstructive pulmonary disease) ([place]) 2016   by xray  . Diabetes mellitus type 2, controlled ([place])   . Glaucoma   . Hearing loss   . HLD (hyperlipidemia)   . HTN (hypertension)   . IgM monoclonal gammopathy of uncertain significance 08/27/2014   07/2014 suggest rpt 6 mo.  . Malignant melanoma of left upper arm ([place]) 11/14/2014   s/p excision complicated by infected axillary seroma (Dr Carmin Muskrat)  . Melanoma ([place]) 2014   nose and left hand  . Secondary hyperparathyroidism ([place])   . Subclavian artery stenosis,  right ([place]) 2014   found on carotid US, seen [facility], recommended recheck 1 yr at vascular lab with Dr. Delana Meyer  . Thyroid nodule 02/2013   right upper lobe - s/p biopsy - benign follicular nodule  . Urine incontinence     Past Surgical History:  Procedure Laterality Date  . APPENDECTOMY  1938  . arm surgery     Right after trauma  . BIOPSY THYROID Right [id number]/8887   benign follicular nodule  . carotid US  07/2007   WNL  . 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 US ECHOCARDIOGRAPHY  06/2007   EF 55%, mod dil LA, mild LVH,mild pulm HTN    Family History  Problem Relation Age of Onset  . Cancer Mother        stomach  . Cancer Brother        colon  . Cancer Brother        colon  . Cancer Sister        hodgkin's lymphoma  . Cancer Brother        prostate  . Diabetes Brother   . Coronary artery disease Neg Hx   . Stroke Neg Hx     Social History:  reports that he quit smoking about 49 years ago. His smoking use included Cigarettes. He has a 7.50 pack-year smoking history. He has never used smokeless tobacco.  He reports that he does not drink alcohol or use drugs.  His wife has Alzheimer's.  His daughter, Charles Rodgers, brings food to the house.  His brother's phone number is:  [phone].  The patient is accompanied by his brother, Charles Rodgers.  Allergies: No Known Allergies  Current Medications: Current Outpatient Prescriptions  Medication Sig Dispense Refill  . amLODipine (NORVASC) 5 MG tablet TAKE 1 TABLET BY MOUTH ONCE A DAY 90 tablet 1  . Blood Glucose Monitoring Suppl (ONE TOUCH ULTRA 2) W/DEVICE KIT     . Casanthranol-Docusate Sodium 30-100 MG CAPS Take by mouth daily.    . dorzolamide-timolol (COSOPT) 22.3-6.8 MG/ML ophthalmic solution Place 1 drop into both eyes 2 (two) times daily. 10 mL   . doxazosin (CARDURA) 8 MG tablet TAKE 1 TABLET BY MOUTH DAILY AT BEDTIME 90 tablet 1  . ELIQUIS 2.5 MG TABS tablet TAKE 1 TABLET BY MOUTH TWICE (2)  DAILY 180 tablet 1  . ferrous sulfate 325 (65 FE) MG tablet TAKE 1 TABLET BY MOUTH ONCE A DAY WITH BREAKFAST. 90 tablet 1  . furosemide (LASIX) 20 MG tablet TAKE 1 TABLET BY MOUTH DAILY 30 tablet 6  . glucose blood (ONE TOUCH ULTRA TEST) test strip USE TO CHECK BLOOD SUGAR 3 TIMES DAILY Dx: E11.42 300 each 2  . latanoprost (XALATAN) 0.005 % ophthalmic solution Place 1 drop into both eyes at bedtime. 2.5 mL   . lisinopril-hydrochlorothiazide (PRINZIDE,ZESTORETIC) 20-25 MG tablet TAKE 1 TABLET BY MOUTH ONCE A DAY 90 tablet 2  . Multiple Vitamins-Minerals (PRESERVISION AREDS) CAPS Take 2 capsules by mouth daily.    Glory Rosebush DELICA LANCETS MISC Use as directed    . potassium chloride (K-DUR) 10 MEQ tablet TAKE 1 TABLET BY MOUTH DAILY AS NEEDED 30 tablet 3  . pravastatin (PRAVACHOL) 40 MG tablet TAKE 1 TABLET BY MOUTH AT BEDTIME 90 tablet 0  . Vitamin D, Ergocalciferol, (DRISDOL) 50000 units CAPS capsule Take 50,000 Units by mouth every 30 (thirty) days.     No current facility-administered medications for this visit.     Review of Systems:  GENERAL:  Energy level is low.  No fevers or sweats .  Weight down 5 pounds. PERFORMANCE STATUS (ECOG):  1 HEENT:  Hard of hearing (lost hearing aide).  No visual changes, runny nose, sore throat, mouth sores or tenderness. Lungs: No shortness of breath or cough.  No hemoptysis. Cardiac:  No chest pain, palpitations, orthopnea, or PND. GI:  No nausea, vomiting, diarrhea, constipation, melena or hematochezia. GU:  No urgency, frequency, dysuria, or hematuria. Musculoskeletal:  No back pain.  Arthritis pain.  No muscle tenderness. Extremities:  No pain or swelling. Skin:  Melanoma s/p removal.  No rashes or skin changes. Neuro:  No headache, numbness or weakness, balance or coordination issues. Endocrine:  No diabetes, thyroid issues, hot flashes or night sweats. Psych:  No mood changes, depression or anxiety. Pain:  No focal pain. Review of systems:   All other systems reviewed and found to be negative.   Physical Exam: 163 Blood pressure 132/67, pulse 70, temperature 99.7 F (37.6 C), temperature source Tympanic, resp. rate 18, weight 157 lb (71.2 kg). GENERAL:  Well developed, well nourished, elderly gentleman sitting comfortably in the exam room in no acute distress. MENTAL STATUS:  Alert and oriented to person, place and time. HEAD:  Wearing a red/blue cap.  White hair.  Male pattern baldness.  Normocephalic, atraumatic, face symmetric, no Cushingoid features. EYES:  Glasses.  Hazel/brown eyes.  Pupils equal round and reactive to light and accomodation.  No conjunctivitis or scleral icterus. ENT:  Oropharynx clear without lesion.  Tongue normal. Mucous membranes moist.  RESPIRATORY:  Clear to auscultation without rales, wheezes or rhonchi. CARDIOVASCULAR:  Regular rate and rhythm without murmur, rub or gallop. ABDOMEN:  Ventral hernia.  Soft, non-tender, with active bowel sounds, and no hepatosplenomegaly.  No masses. SKIN:  Skin changes due to sun exposure.  No rashes, ulcers or lesions. EXTREMITIES: Trace ankle edema.  No skin discoloration or tenderness.  No palpable cords. LYMPH NODES: No palpable cervical, supraclavicular, axillary or inguinal adenopathy  NEUROLOGICAL: Unremarkable. PSYCH:  Appropriate.    Clinical Support on 08/17/2016  Component Date Value Ref Range Status  . Retic Ct Pct 08/17/2016 0.8  0.4 - 3.1 % Final  . RBC. 08/17/2016 3.02* 4.40 - 5.90 MIL/uL Final  . Retic Count, Manual 08/17/2016 24.2  19.0 - 183.0 K/uL Final  . WBC 08/17/2016 5.9  3.8 - 10.6 K/uL Final  . RBC 08/17/2016 2.98* 4.40 - 5.90 MIL/uL Final  . Hemoglobin 08/17/2016 9.5* 13.0 - 18.0 g/dL Final  . HCT 08/17/2016 28.8* 40.0 - 52.0 % Final  . MCV 08/17/2016 96.9  80.0 - 100.0 fL Final  . MCH 08/17/2016 32.0  26.0 - 34.0 pg Final  . MCHC 08/17/2016 33.0  32.0 - 36.0 g/dL Final  . RDW 08/17/2016 14.2  11.5 - 14.5 % Final  . Platelets  08/17/2016 71* 150 - 440 K/uL Final  . Neutrophils Relative % 08/17/2016 78  % Final  . Neutro Abs 08/17/2016 4.6  1.4 - 6.5 K/uL Final  . Lymphocytes Relative 08/17/2016 14  % Final  . Lymphs Abs 08/17/2016 0.8* 1.0 - 3.6 K/uL Final  . Monocytes Relative 08/17/2016 7  % Final  . Monocytes Absolute 08/17/2016 0.4  0.2 - 1.0 K/uL Final  . Eosinophils Relative 08/17/2016 1  % Final  . Eosinophils Absolute 08/17/2016 0.0  0 - 0.7 K/uL Final  . Basophils Relative 08/17/2016 0  % Final  . Basophils Absolute 08/17/2016 0.0  0 - 0.1 K/uL Final    Assessment:  Genie Mirabal is an 83 y.o. male with mild cytopenias likely multi-factorial in nature (renal insufficiency and anemia of chronic disease).  He has a 2 year history of a normocytic anemia, thrombocytopenia and a normal white count.    He has a monoclonal gammopathy of unknown significance (MGUS).  24 hour UPEP revealed no monoclonal protein on 09/14/2014.  SPEP has been followed:  0.3 gm/dL on 08/15/2014, 0.4 gm.dL on 09/11/2014, and 0.3 gm/dL on 05/07/2016.  Kappa free light chains have been followed:  147.5 (ratio 4.56) on 09/11/2014 and 153.6 (ratio 3.35) on 05/07/2016.  Bone survey on 10/03/2014 revealed subtle lucencies within the calvarium, left femoral head, and proximal left tibia which could reflect myelomatous involvement.  Bone survey on 10/03/2015 revealed no definite radiolucent lesions. There was diffuse osteopenia.  There were multiple old healed right lateral rib fractures.  Bone marrow aspirate and biopsy on 10/15/2015 revealed no significant dysplasia or abnormal or overt neoplasia. Marrow was slightly hypercellular for age (40%) with maturing trilineage hematopoiesis, relatively diminished erythroid precursors, and adequate megakaryocytes. There was marginally increased polytypic plasma cells (5%). There was diffuse mild increase in reticulin. There were trace iron stores.  Flow cytometry revealed a low level monoclonal  B population (1%) with nonspecific immunophenotype.  Cytogenetics were normal (46, XY) with a normal SNP microarray.   He has chronic renal  insufficiency (Cr 1.7-2.03) over the past 2 years without trend.  He denies any new medications or herbal products.  Peripheral smear on 08/27/2014 revealed no abnormal white blood cells, thrombocytopenia without clumping and some large platelets. There were elliptocytes, teardrop red blood cells, and schistocytes. Teardrop RBCs and elliptocytes suggest possible extramedullary hematopoiesis (myelofibrosis).  Work-up on 09/11/2014 included CBC with a hematocrit 30.4, hemoglobin 9.7, MCV 95.1, platelets 80,000, white count 5300 with an [place] of 3800. Differential was unremarkable.  B12, folate, TSH, ANA, PT, PTT, LDH.  Reticulocyte count was low at 3.19%. Ferritin was 47 with a iron saturation of 20% and a TIBC of 265 (normal).  He rarely eats meat.  He denies any melena or hematochezia.  He has never had a colonoscopy.  Guaiac cards were negative 2 on 09/16/2014.   Symptomatically, he notes "slow energy".  He does not eat well.  He stopped Meals on Wheels.  He has lost weight.  Exam reveals no adenopathy or hepatosplenomegaly.  Hematocrit is 28.8 and hemoglobin 9.5.  Plan: 1.  Labs today:  CBC with diff, ANA with reflex, epo level, retic, TSH, B12, folate. 2.  Discuss importance of caloric intake. 3.  RTC in 1 month for MD assess and labs (CBC with diff, CMP, SPEP, free light chains, ferritin).   Lequita Asal, MD  08/17/2016 , 4:18 PM

## 2016-08-18 LAB — THYROID PANEL WITH TSH
Free Thyroxine Index: 2.5 (ref 1.2–4.9)
T3 Uptake Ratio: 45 % — ABNORMAL HIGH (ref 24–39)
T4, Total: 5.6 ug/dL (ref 4.5–12.0)
TSH: 1.8 u[IU]/mL (ref 0.450–4.500)

## 2016-08-18 LAB — ERYTHROPOIETIN: Erythropoietin: 12.9 m[IU]/mL (ref 2.6–18.5)

## 2016-08-18 LAB — ANA W/REFLEX: Anti Nuclear Antibody(ANA): NEGATIVE

## 2016-08-20 ENCOUNTER — Other Ambulatory Visit: Payer: Self-pay | Admitting: Family Medicine

## 2016-09-14 ENCOUNTER — Other Ambulatory Visit: Payer: Self-pay | Admitting: Family Medicine

## 2016-09-14 DIAGNOSIS — E1142 Type 2 diabetes mellitus with diabetic polyneuropathy: Secondary | ICD-10-CM

## 2016-09-14 DIAGNOSIS — N183 Chronic kidney disease, stage 3 unspecified: Secondary | ICD-10-CM

## 2016-09-14 DIAGNOSIS — E785 Hyperlipidemia, unspecified: Secondary | ICD-10-CM

## 2016-09-16 ENCOUNTER — Ambulatory Visit (INDEPENDENT_AMBULATORY_CARE_PROVIDER_SITE_OTHER): Payer: Medicare Other

## 2016-09-16 VITALS — BP 136/68 | HR 51 | Temp 98.0°F | Ht 67.0 in | Wt 164.0 lb

## 2016-09-16 DIAGNOSIS — Z0000 Encounter for general adult medical examination without abnormal findings: Secondary | ICD-10-CM | POA: Diagnosis not present

## 2016-09-16 DIAGNOSIS — N183 Chronic kidney disease, stage 3 unspecified: Secondary | ICD-10-CM

## 2016-09-16 DIAGNOSIS — E1142 Type 2 diabetes mellitus with diabetic polyneuropathy: Secondary | ICD-10-CM | POA: Diagnosis not present

## 2016-09-16 DIAGNOSIS — E785 Hyperlipidemia, unspecified: Secondary | ICD-10-CM | POA: Diagnosis not present

## 2016-09-16 LAB — RENAL FUNCTION PANEL
ALBUMIN: 3.6 g/dL (ref 3.5–5.2)
BUN: 81 mg/dL — AB (ref 6–23)
CO2: 20 mEq/L (ref 19–32)
Calcium: 8.9 mg/dL (ref 8.4–10.5)
Chloride: 111 mEq/L (ref 96–112)
Creatinine, Ser: 1.9 mg/dL — ABNORMAL HIGH (ref 0.40–1.50)
GFR: 35.58 mL/min — ABNORMAL LOW (ref >60.00)
GLUCOSE: 116 mg/dL — AB (ref 70–99)
PHOSPHORUS: 4.1 mg/dL (ref 2.3–4.6)
Potassium: 4.1 mEq/L (ref 3.5–5.1)
SODIUM: 140 meq/L (ref 135–145)

## 2016-09-16 LAB — VITAMIN D 25 HYDROXY (VIT D DEFICIENCY, FRACTURES): VITD: 27.06 ng/mL — AB (ref 30.00–100.00)

## 2016-09-16 LAB — LIPID PANEL
CHOLESTEROL: 77 mg/dL (ref 0–200)
HDL: 31.5 mg/dL — ABNORMAL LOW (ref >39.00)
LDL Cholesterol: 36 mg/dL (ref 0–99)
NonHDL: 45.41
TRIGLYCERIDES: 47 mg/dL (ref 0.0–149.0)
Total CHOL/HDL Ratio: 2
VLDL: 9.4 mg/dL (ref 0.0–40.0)

## 2016-09-16 LAB — HEMOGLOBIN A1C: HEMOGLOBIN A1C: 6.4 % (ref 4.6–6.5)

## 2016-09-16 NOTE — Progress Notes (Signed)
Subjective:   Charles Rodgers is a 83 y.o. male who presents for Medicare Annual/Subsequent preventive examination.  Review of Systems:  N/A Cardiac Risk Factors include: advanced age (>35mn, >>16women);male gender;diabetes mellitus     Objective:    Vitals: BP 136/68 (BP Location: Right Arm, Patient Position: Sitting, Cuff Size: Normal)   Pulse (!) 51   Temp 98 F (36.7 C) (Oral)   Ht 5' 7"  (1.702 m) Comment: shoes  Wt 164 lb (74.4 kg)   SpO2 97%   BMI 25.69 kg/m   Body mass index is 25.69 kg/m.  Tobacco History  Smoking Status  . Former Smoker  . Packs/day: 0.50  . Years: 15.00  . Types: Cigarettes  . Quit date: 03/31/1967  Smokeless Tobacco  . Never Used     Counseling given: No   Past Medical History:  Diagnosis Date  . Arthritis   . BPH (benign prostatic hypertrophy)    with elevated PSA, s/p benign biopsy[facility]  . CKD (chronic kidney disease) stage 3, GFR 30-59 ml/min    [place] -> 2017 requested PCP start following  . COPD (chronic obstructive pulmonary disease) ([place]   by xray  . Diabetes mellitus type 2, controlled ([place]   . Glaucoma   . Hearing loss   . HLD (hyperlipidemia)   . HTN (hypertension)   . IgM monoclonal gammopathy of uncertain significance 08/27/2014   07/2014 suggest rpt 6 mo.  . Malignant [place] 11/14/2014   s/p excision complicated by infected axillary seroma (Dr OCarmin Muskrat  . Melanoma ([place] 2014   nose and left hand  . Secondary hyperparathyroidism ([place]   . Subclavian artery stenosis, right ([place] 2014   found on carotid [place] seen [facility], recommended recheck 1 yr at vascular lab with Dr. SDelana Meyer . Thyroid nodule 02/2013   right upper lobe - s/p biopsy - benign follicular nodule  . Urine incontinence    Past Surgical History:  Procedure Laterality Date  . APPENDECTOMY  1938  . arm surgery     Right after trauma  . BIOPSY THYROID Right 171/0626  benign follicular nodule  . carotid [place] 07/2007   WNL  . MOHS SURGERY Left 07/2012   L dorsal hand for basosquamous carcinoma (Link Snuffer  . [place]  06/2007   EF 55%, mod dil LA, mild LVH,mild pulm HTN   Family History  Problem Relation Age of Onset  . Cancer Mother        stomach  . Cancer Brother        colon  . Cancer Brother        colon  . Cancer Sister        hodgkin's lymphoma  . Cancer Brother        prostate  . Diabetes Brother   . Coronary artery disease Neg Hx   . Stroke Neg Hx    History  Sexual Activity  . Sexual activity: Not on file    Outpatient Encounter Prescriptions as of 09/16/2016  Medication Sig  . amLODipine (NORVASC) 5 MG tablet TAKE 1 TABLET BY MOUTH ONCE DAILY  . Blood Glucose Monitoring Suppl (ONE TOUCH ULTRA 2) W/DEVICE KIT   . Casanthranol-Docusate Sodium 30-100 MG CAPS Take by mouth daily.  . dorzolamide-timolol (COSOPT) 22.3-6.8 MG/ML ophthalmic solution Place 1 drop into both eyes 2 (two) times daily.  .Marland Kitchendoxazosin (CARDURA) 8 MG tablet TAKE 1 TABLET  BY MOUTH DAILY AT BEDTIME  . ELIQUIS 2.5 MG TABS tablet TAKE 1 TABLET BY MOUTH TWICE (2) DAILY  . ferrous sulfate 325 (65 FE) MG tablet TAKE 1 TABLET BY MOUTH ONCE A DAY WITH BREAKFAST.  . furosemide (LASIX) 20 MG tablet TAKE 1 TABLET BY MOUTH DAILY  . glucose blood (ONE TOUCH ULTRA TEST) test strip USE TO CHECK BLOOD SUGAR 3 TIMES DAILY Dx: Q98.26  . latanoprost (XALATAN) 0.005 % ophthalmic solution Place 1 drop into both eyes at bedtime.  Marland Kitchen lisinopril-hydrochlorothiazide (PRINZIDE,ZESTORETIC) 20-25 MG tablet TAKE 1 TABLET BY MOUTH ONCE A DAY  . Multiple Vitamins-Minerals (PRESERVISION AREDS) CAPS Take 2 capsules by mouth daily.  Glory Rosebush DELICA LANCETS MISC Use as directed  . potassium chloride (K-DUR) 10 MEQ tablet TAKE 1 TABLET BY MOUTH DAILY AS NEEDED  . pravastatin (PRAVACHOL) 40 MG tablet TAKE 1 TABLET BY MOUTH AT BEDTIME  . Vitamin D, Ergocalciferol, (DRISDOL) 50000 units  CAPS capsule Take 50,000 Units by mouth every 30 (thirty) days.   No facility-administered encounter medications on file as of 09/16/2016.     Activities of Daily Living In your present state of health, do you have any difficulty performing the following activities: 09/16/2016  Hearing? Y  Vision? N  Difficulty concentrating or making decisions? Y  Walking or climbing stairs? N  Dressing or bathing? N  Doing errands, shopping? N  Preparing Food and eating ? N  Using the Toilet? N  In the past six months, have you accidently leaked urine? N  Do you have problems with loss of bowel control? N  Managing your Medications? N  Managing your Finances? N  Housekeeping or managing your Housekeeping? N  Some recent data might be hidden    Patient Care Team: [name], MD as PCP - General (Family Medicine) Lavonia Dana, MD as Consulting Physician (Internal Medicine) Dingeldein, Remo Lipps, MD as Consulting Physician (Ophthalmology)   Assessment:     Exercise Activities and Dietary recommendations Current Exercise Habits: Home exercise routine, Type of exercise: walking, Time (Minutes): 15, Frequency (Times/Week): 7, Weekly Exercise (Minutes/Week): 105, Intensity: Mild, Exercise limited by: None identified  Goals    . Increase physical activity          Starting 09/16/2016, I will continue to walk at least 10 min daily.       Fall Risk Fall Risk  09/16/2016 09/09/2015 02/09/2014 05/17/2012  Falls in the past year? Yes Yes No No  Number falls in past yr: 1 1 - -  Injury with Fall? Yes No - -  Risk for fall due to : - Impaired balance/gait - -   Depression Screen PHQ 2/9 Scores 09/16/2016 09/09/2015 02/09/2014 05/17/2012  PHQ - 2 Score 0 0 0 0    Cognitive Function MMSE - Mini Mental State Exam 09/16/2016  Orientation to time 5  Orientation to Place 5  Registration 3  Attention/ Calculation 0  Recall 3  Language- name 2 objects 0  Language- repeat 1  Language- follow 3  step command 2  Language- follow 3 step command-comments pt was unable to follow 1 step of 3 step command  Language- read & follow direction 0  Write a sentence 0  Copy design 0  Total score 19     PLEASE NOTE: A Mini-Cog screen was completed. Maximum score is 20. A value of 0 denotes this part of [place] MMSE was not completed or the patient failed this part of the Mini-Cog screening.   Mini-Cog  Screening Orientation to Time - Max 5 pts Orientation to Place - Max 5 pts Registration - Max 3 pts Recall - Max 3 pts Language Repeat - Max 1 pts Language Follow 3 Step Command - Max 3 pts     Immunization History  Administered Date(s) Administered  . Influenza Split 01/15/2012  . Influenza,inj,Quad PF,36+ Mos 02/14/2013, 12/20/2013, 12/25/2014, 12/18/2015  . Pneumococcal Conjugate-13 02/09/2014  . Pneumococcal Polysaccharide-23 10/13/2011  . Td 01/15/2012   Screening Tests Health Maintenance  Topic Date Due  . DTaP/Tdap/Td (1 - Tdap) 01/14/2022 (Originally 01/16/2012)  . INFLUENZA VACCINE  10/28/2016  . FOOT EXAM  02/23/2017  . OPHTHALMOLOGY EXAM  02/27/2017  . HEMOGLOBIN A1C  03/18/2017  . TETANUS/TDAP  01/14/2022  . PNA vac Low Risk Adult  Completed      Plan:     I have personally reviewed and addressed the Medicare Annual Wellness questionnaire and have noted the following in the patient's chart:  A. Medical and social history B. Use of alcohol, tobacco or illicit drugs  C. Current medications and supplements D. Functional ability and status E.  Nutritional status F.  Physical activity G. Advance directives H. List of other physicians I.  Hospitalizations, surgeries, and ER visits in previous 12 months J.  [place] to include hearing, vision, cognitive, depression L. Referrals and appointments - none  In addition, I have reviewed and discussed with patient certain preventive protocols, quality metrics, and best practice recommendations. A written  personalized care plan for preventive services as well as general preventive health recommendations were provided to patient.  See attached scanned questionnaire for additional information.   Signed,   Lindell Noe, MHA, BS, LPN Health Coach

## 2016-09-16 NOTE — Progress Notes (Signed)
I reviewed health advisor's note, was available for consultation, and agree with documentation and plan.  

## 2016-09-16 NOTE — Progress Notes (Signed)
PCP notes:   Health maintenance:  A1C - completed  Abnormal screenings:   Fall risk -hx of fall with injury and no medical treatment Mini-Cog score: 19/20  Patient concerns:   Pt fell backwards when attempting to sit down in a chair. While trying to prevent falling, pt hit arm on side of wall. Pt has a healing wound distal to right elbow. Wound is 5.5 cm X 1.5 cm X 0 cm.  Wound is oblong in shape. No drainage or odor present. Scabbing present to wound bed. Skin surrounding wound is dry, pink, intact, and swollen. There is no bandage present. Pt states he has been applying alcohol to wound.   PCP was notified. After assessing wound, pt was advised by PCP to discontinue using alcohol, keep wound dry, apply TAO, and cover with bandage.  Prior to pt leaving office, wound was cleaned with NS. TAO and bandage applied. Pt tolerated procedure well.   Nurse concerns:  None  Next PCP appt:   09/23/16 @ 1130

## 2016-09-16 NOTE — Patient Instructions (Signed)
Charles Rodgers , Thank you for taking time to come for your Medicare Wellness Visit. I appreciate your ongoing commitment to your health goals. Please review the following plan we discussed and let me know if I can assist you in the future.   These are the goals we discussed: Goals    . Increase physical activity          Starting 09/16/2016, I will continue to walk at least 10 min daily.        This is a list of the screening recommended for you and due dates:  Health Maintenance  Topic Date Due  . DTaP/Tdap/Td vaccine (1 - Tdap) 01/14/2022*  . Flu Shot  10/28/2016  . Complete foot exam   02/23/2017  . Eye exam for diabetics  02/27/2017  . Hemoglobin A1C  03/18/2017  . Tetanus Vaccine  01/14/2022  . Pneumonia vaccines  Completed  *Topic was postponed. The date shown is not the original due date.   Preventive Care for Adults  A healthy lifestyle and preventive care can promote health and wellness. Preventive health guidelines for adults include the following key practices.  . A routine yearly physical is a good way to check with your health care provider about your health and preventive screening. It is a chance to share any concerns and updates on your health and to receive a thorough exam.  . Visit your dentist for a routine exam and preventive care every 6 months. Brush your teeth twice a day and floss once a day. Good oral hygiene prevents tooth decay and gum disease.  . The frequency of eye exams is based on your age, health, family medical history, use  of contact lenses, and other factors. Follow your health care provider's ecommendations for frequency of eye exams.  . Eat a healthy diet. Foods like vegetables, fruits, whole grains, low-fat dairy products, and lean protein foods contain the nutrients you need without too many calories. Decrease your intake of foods high in solid fats, added sugars, and salt. Eat the right amount of calories for you. Get information about a  proper diet from your health care provider, if necessary.  . Regular physical exercise is one of the most important things you can do for your health. Most adults should get at least 150 minutes of moderate-intensity exercise (any activity that increases your heart rate and causes you to sweat) each week. In addition, most adults need muscle-strengthening exercises on 2 or more days a week.  Silver Sneakers may be a benefit available to you. To determine eligibility, you may visit the website: www.silversneakers.com or contact program at 505-841-4121 Mon-Fri between 8AM-8PM.   . Maintain a healthy weight. The body mass index (BMI) is a screening tool to identify possible weight problems. It provides an estimate of body fat based on height and weight. Your health care provider can find your BMI and can help you achieve or maintain a healthy weight.   For adults 20 years and older: ? A BMI below 18.5 is considered underweight. ? A BMI of 18.5 to 24.9 is normal. ? A BMI of 25 to 29.9 is considered overweight. ? A BMI of 30 and above is considered obese.   . Maintain normal blood lipids and cholesterol levels by exercising and minimizing your intake of saturated fat. Eat a balanced diet with plenty of fruit and vegetables. Blood tests for lipids and cholesterol should begin at age 52 and be repeated every 5 years. If your lipid  or cholesterol levels are high, you are over 50, or you are at high risk for heart disease, you may need your cholesterol levels checked more frequently. Ongoing high lipid and cholesterol levels should be treated with medicines if diet and exercise are not working.  . If you smoke, find out from your health care provider how to quit. If you do not use tobacco, please do not start.  . If you choose to drink alcohol, please do not consume more than 2 drinks per day. One drink is considered to be 12 ounces (355 mL) of beer, 5 ounces (148 mL) of wine, or 1.5 ounces (44 mL) of  liquor.  . If you are 1-65 years old, ask your health care provider if you should take aspirin to prevent strokes.  . Use sunscreen. Apply sunscreen liberally and repeatedly throughout the day. You should seek shade when your shadow is shorter than you. Protect yourself by wearing long sleeves, pants, a wide-brimmed hat, and sunglasses year round, whenever you are outdoors.  . Once a month, do a whole body skin exam, using a mirror to look at the skin on your back. Tell your health care provider of new moles, moles that have irregular borders, moles that are larger than a pencil eraser, or moles that have changed in shape or color.

## 2016-09-16 NOTE — Progress Notes (Signed)
Pre visit review using our clinic review tool, if applicable. No additional management support is needed unless otherwise documented below in the visit note. 

## 2016-09-17 ENCOUNTER — Inpatient Hospital Stay: Payer: Medicare Other

## 2016-09-17 ENCOUNTER — Inpatient Hospital Stay: Payer: Medicare Other | Admitting: Hematology and Oncology

## 2016-09-17 ENCOUNTER — Other Ambulatory Visit: Payer: Self-pay | Admitting: Hematology and Oncology

## 2016-09-17 NOTE — Progress Notes (Deleted)
[facility] day:   09/17/2016   Chief Complaint: Charles Rodgers is an 83 y.o. male with anemia, thrombocytopenia, chronic renal insufficiency, and a monoclonal gammopathy who is seen for 1 month assessment on oral iron.  HPI: The patient was last seen in the medical oncology clinic on 08/17/2016.  At that time, energy level was low.  He had stopped Meals on Wheels.  He was dependent on his daughter to bring meals as his wife has Alzheimer's and was no longer cooking.  Labs at last visit included a hematocrit 28.8, hemoglobin 9.5, MCV 96.9, platelets 71,000, white count 5900 with an [place] of 4600.  B12 was 626. Folate was 27.0.  TSH was 1.8.  Retic was 0.8%.  Epo level was 12.9.  ANA was negative.  Creatinine was 1.90 (CrCl 24.6 ml/min) on 09/16/2016.  Ferritin was 48 on 08/06/2016.  SPEP was 0.3 gm/dL on 05/07/2016.  Symptomatically,   Past Medical History:  Diagnosis Date  . Arthritis   . BPH (benign prostatic hypertrophy)    with elevated PSA, s/p benign biopsy, followed yearly by [facility] (last seen 01/16/2013)  . CKD (chronic kidney disease) stage 3, GFR 30-59 ml/min    [place] -> 2017 requested PCP start following  . COPD (chronic obstructive pulmonary disease) ([place]) 2016   by xray  . Diabetes mellitus type 2, controlled ([place])   . Glaucoma   . Hearing loss   . HLD (hyperlipidemia)   . HTN (hypertension)   . IgM monoclonal gammopathy of uncertain significance 08/27/2014   07/2014 suggest rpt 6 mo.  . Malignant melanoma of left upper arm ([place]) 11/14/2014   s/p excision complicated by infected axillary seroma (Dr Carmin Muskrat)  . Melanoma ([place]) 2014   nose and left hand  . Secondary hyperparathyroidism ([place])   . Subclavian artery stenosis, right ([place]) 2014   found on carotid US, seen [facility], recommended recheck 1 yr at vascular lab with Dr. Delana Meyer  . Thyroid nodule 02/2013   right upper lobe - s/p biopsy - benign follicular nodule  .  Urine incontinence     Past Surgical History:  Procedure Laterality Date  . APPENDECTOMY  1938  . arm surgery     Right after trauma  . BIOPSY THYROID Right 31/5400   benign follicular nodule  . carotid US  07/2007   WNL  . 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 US ECHOCARDIOGRAPHY  06/2007   EF 55%, mod dil LA, mild LVH,mild pulm HTN    Family History  Problem Relation Age of Onset  . Cancer Mother        stomach  . Cancer Brother        colon  . Cancer Brother        colon  . Cancer Sister        hodgkin's lymphoma  . Cancer Brother        prostate  . Diabetes Brother   . Coronary artery disease Neg Hx   . Stroke Neg Hx     Social History:  reports that he quit smoking about 49 years ago. His smoking use included Cigarettes. He has a 7.50 pack-year smoking history. He has never used smokeless tobacco. He reports that he does not drink alcohol or use drugs.  His wife has Alzheimer's.  His daughter, Loletha Carrow, brings food to the house.  His brother's phone number is:  [phone].  The patient is accompanied by his brother, [name].  Allergies: No Known Allergies  Current Medications: Current Outpatient Prescriptions  Medication Sig Dispense Refill  . amLODipine (NORVASC) 5 MG tablet TAKE 1 TABLET BY MOUTH ONCE DAILY 90 tablet 2  . Blood Glucose Monitoring Suppl (ONE TOUCH ULTRA 2) W/DEVICE KIT     . Casanthranol-Docusate Sodium 30-100 MG CAPS Take by mouth daily.    . dorzolamide-timolol (COSOPT) 22.3-6.8 MG/ML ophthalmic solution Place 1 drop into both eyes 2 (two) times daily. 10 mL   . doxazosin (CARDURA) 8 MG tablet TAKE 1 TABLET BY MOUTH DAILY AT BEDTIME 90 tablet 1  . ELIQUIS 2.5 MG TABS tablet TAKE 1 TABLET BY MOUTH TWICE (2) DAILY 180 tablet 1  . ferrous sulfate 325 (65 FE) MG tablet TAKE 1 TABLET BY MOUTH ONCE A DAY WITH BREAKFAST. 90 tablet 1  . furosemide (LASIX) 20 MG tablet TAKE 1 TABLET BY MOUTH DAILY 30 tablet 6   . glucose blood (ONE TOUCH ULTRA TEST) test strip USE TO CHECK BLOOD SUGAR 3 TIMES DAILY Dx: E11.42 300 each 2  . latanoprost (XALATAN) 0.005 % ophthalmic solution Place 1 drop into both eyes at bedtime. 2.5 mL   . lisinopril-hydrochlorothiazide (PRINZIDE,ZESTORETIC) 20-25 MG tablet TAKE 1 TABLET BY MOUTH ONCE A DAY 90 tablet 2  . Multiple Vitamins-Minerals (PRESERVISION AREDS) CAPS Take 2 capsules by mouth daily.    Glory Rosebush DELICA LANCETS MISC Use as directed    . potassium chloride (K-DUR) 10 MEQ tablet TAKE 1 TABLET BY MOUTH DAILY AS NEEDED 30 tablet 3  . pravastatin (PRAVACHOL) 40 MG tablet TAKE 1 TABLET BY MOUTH AT BEDTIME 90 tablet 0  . Vitamin D, Ergocalciferol, (DRISDOL) 50000 units CAPS capsule Take 50,000 Units by mouth every 30 (thirty) days.     No current facility-administered medications for this visit.     Review of Systems:  GENERAL:  Energy level is low.  No fevers or sweats .  Weight up 1 pound. PERFORMANCE STATUS (ECOG):  1 HEENT:  Hard of hearing.  No visual changes, runny nose, sore throat, mouth sores or tenderness. Lungs: No shortness of breath or cough.  No hemoptysis. Cardiac:  No chest pain, palpitations, orthopnea, or PND. GI:  No nausea, vomiting, diarrhea, constipation, melena or hematochezia. GU:  No urgency, frequency, dysuria, or hematuria. Musculoskeletal:  No back pain.  Arthritis pain.  No muscle tenderness. Extremities:  No pain or swelling. Skin:  Melanoma s/p removal.  No rashes or skin changes. Neuro:  No headache, numbness or weakness, balance or coordination issues. Endocrine:  No diabetes, thyroid issues, hot flashes or night sweats. Psych:  No mood changes, depression or anxiety. Pain:  No focal pain. Review of systems:  All other systems reviewed and found to be negative.   Physical Exam: 163 There were no vitals taken for this visit. GENERAL:  Well developed, well nourished, elderly gentleman sitting comfortably in the exam room in no  acute distress. MENTAL STATUS:  Alert and oriented to person, place and time. HEAD:  Wearing a white cap.  White hair.  Male pattern baldness.  Normocephalic, atraumatic, face symmetric, no Cushingoid features. EYES:  Glasses.  Hazel/brown eyes.  Pupils equal round and reactive to light and accomodation.  No conjunctivitis or scleral icterus. ENT:  Hearing aides.  Oropharynx clear without lesion.  Tongue normal. Mucous membranes moist.  RESPIRATORY:  Clear to auscultation without rales, wheezes or rhonchi. CARDIOVASCULAR:  Regular rate and rhythm without murmur, rub or  gallop. ABDOMEN:  Ventral hernia.  Soft, non-tender, with active bowel sounds, and no hepatosplenomegaly.  No masses. SKIN:  Changes due to sun exposure.  No rashes, ulcers or lesions. EXTREMITIES: Trace ankle edema.  No skin discoloration or tenderness.  No palpable cords. LYMPH NODES: No palpable cervical, supraclavicular, axillary or inguinal adenopathy  NEUROLOGICAL: Unremarkable. PSYCH:  Appropriate.    Clinical Support on 09/16/2016  Component Date Value Ref Range Status  . Sodium 09/16/2016 140  135 - 145 mEq/L Final  . Potassium 09/16/2016 4.1  3.5 - 5.1 mEq/L Final  . Chloride 09/16/2016 111  96 - 112 mEq/L Final  . CO2 09/16/2016 20  19 - 32 mEq/L Final  . Calcium 09/16/2016 8.9  8.4 - 10.5 mg/dL Final  . Albumin 09/16/2016 3.6  3.5 - 5.2 g/dL Final  . BUN 09/16/2016 81* 6 - 23 mg/dL Final  . Creatinine, Ser 09/16/2016 1.90* 0.40 - 1.50 mg/dL Final  . Glucose, Bld 09/16/2016 116* 70 - 99 mg/dL Final  . Phosphorus 09/16/2016 4.1  2.3 - 4.6 mg/dL Final  . GFR 09/16/2016 35.58* >60.00 mL/min Final  . Cholesterol 09/16/2016 77  0 - 200 mg/dL Final   ATP III Classification       Desirable:  < 200 mg/dL               Borderline High:  200 - 239 mg/dL          High:  > = 240 mg/dL  . Triglycerides 09/16/2016 47.0  0.0 - 149.0 mg/dL Final   Normal:  <150 mg/dLBorderline High:  150 - 199 mg/dL  . HDL 09/16/2016 31.50*  >39.00 mg/dL Final  . VLDL 09/16/2016 9.4  0.0 - 40.0 mg/dL Final  . LDL Cholesterol 09/16/2016 36  0 - 99 mg/dL Final  . Total CHOL/HDL Ratio 09/16/2016 2   Final                  Men          Women1/2 Average Risk     3.4          3.3Average Risk          5.0          4.42X Average Risk          9.6          7.13X Average Risk          15.0          11.0                      . NonHDL 09/16/2016 45.41   Final   NOTE:  Non-HDL goal should be 30 mg/dL higher than patient's LDL goal (i.e. LDL goal of < 70 mg/dL, would have non-HDL goal of < 100 mg/dL)  . Hgb A1c MFr Bld 09/16/2016 6.4  4.6 - 6.5 % Final   Glycemic Control Guidelines for People with Diabetes:Non Diabetic:  <6%Goal of Therapy: <7%Additional Action Suggested:  >8%   . VITD 09/16/2016 27.06* 30.00 - 100.00 ng/mL Final    Assessment:  Charles Rodgers is an 83 y.o. male with mild cytopenias likely multi-factorial in nature (renal insufficiency and anemia of chronic disease).  He has a 2 year history of a normocytic anemia, thrombocytopenia and a normal white count.    He has a monoclonal gammopathy of unknown significance (MGUS).  Serum protein electrophoresis (SPEP) was 0.3 gm/dL on 08/15/2014 and 0.4  gm.dL on 09/11/2014. Kappa free light chains were 147.45, lambda free light chains 32.32, with a kappa lambda ratio of 4.56 (0.26-1.65).  Immunoglobulin levels were normal.  24-hour urine revealed kappa free light chains of 30.6, lambda free light chains 2.25 and a ratio 13.6 (2.04-10.37). 24 hour urine protein electrophoresis revealed no monoclonal protein.  He has chronic renal insufficiency (Cr 1.7-2.03) over the past 2 years without trend.  He denies any new medications or herbal products.  Ferritin is 38.  Peripheral smear on 08/27/2014 revealed no abnormal white blood cells, thrombocytopenia without clumping and some large platelets. There were elliptocytes, teardrop red blood cells, and schistocytes. Teardrop RBCs and  elliptocytes suggest possible extramedullary hematopoiesis (myelofibrosis).  Myelodysplastic syndrome with deletion 20q is a consideration.  Work-up on 09/11/2014 included CBC with a hematocrit 30.4, hemoglobin 9.7, MCV 95.1, platelets 80,000, white count 5300 with an [place] of 3800. Differential was unremarkable.  B12, folate, TSH, ANA, PT, PTT, LDH.  Reticulocyte count was low at 3.19%. Ferritin was 47 with a iron saturation of 20% and a TIBC of 265 (normal).  Bone survey on 10/03/2014 revealed subtle lucencies within the calvarium, left femoral head, and proximal left tibia which could reflect myelomatous involvement.  Bone survey on 10/03/2015 revealed no definite radiolucent lesions. There was diffuse osteopenia.  There were multiple old healed right lateral rib fractures.  Bone marrow aspirate and biopsy on 10/15/2015 revealed no significant dysplasia or abnormal or overt neoplasia. Marrow was slightly hypercellular for age (40%) with maturing trilineage hematopoiesis, relatively diminished erythroid precursors, and adequate megakaryocytes. There was marginally increased polytypic plasma cells (5%). There was diffuse mild increase in reticulin. There were trace iron stores.  Flow cytometry revealed a low level monoclonal B population (1%) with nonspecific immunophenotype.  Cytogenetics were normal (46, XY) with a normal SNP microarray.   He rarely eats meat.  He denies any melena or hematochezia.  He has never had a colonoscopy.  Guaiac cards were negative 2 on 09/16/2014.   Symptomatically, he denies any complaint.  Exam reveals no adenopathy or hepatosplenomegaly.  Hematocrit is 28.2 (today).    Plan: 1.  Labs today:  CBC with diff. 2.  Discuss anemia.     Lequita Asal, MD  09/17/2016 , 6:17 AM

## 2016-09-23 ENCOUNTER — Encounter: Payer: Self-pay | Admitting: Family Medicine

## 2016-09-23 ENCOUNTER — Ambulatory Visit (INDEPENDENT_AMBULATORY_CARE_PROVIDER_SITE_OTHER): Payer: Medicare Other | Admitting: Family Medicine

## 2016-09-23 VITALS — BP 134/70 | HR 65 | Temp 98.0°F | Ht 67.0 in | Wt 164.2 lb

## 2016-09-23 DIAGNOSIS — C4362 Malignant melanoma of left upper limb, including shoulder: Secondary | ICD-10-CM | POA: Diagnosis not present

## 2016-09-23 DIAGNOSIS — E1142 Type 2 diabetes mellitus with diabetic polyneuropathy: Secondary | ICD-10-CM | POA: Diagnosis not present

## 2016-09-23 DIAGNOSIS — E559 Vitamin D deficiency, unspecified: Secondary | ICD-10-CM

## 2016-09-23 DIAGNOSIS — H9193 Unspecified hearing loss, bilateral: Secondary | ICD-10-CM

## 2016-09-23 DIAGNOSIS — Z0000 Encounter for general adult medical examination without abnormal findings: Secondary | ICD-10-CM | POA: Diagnosis not present

## 2016-09-23 DIAGNOSIS — I4891 Unspecified atrial fibrillation: Secondary | ICD-10-CM

## 2016-09-23 DIAGNOSIS — D472 Monoclonal gammopathy: Secondary | ICD-10-CM | POA: Diagnosis not present

## 2016-09-23 DIAGNOSIS — I10 Essential (primary) hypertension: Secondary | ICD-10-CM | POA: Diagnosis not present

## 2016-09-23 DIAGNOSIS — Z7189 Other specified counseling: Secondary | ICD-10-CM

## 2016-09-23 DIAGNOSIS — E785 Hyperlipidemia, unspecified: Secondary | ICD-10-CM | POA: Diagnosis not present

## 2016-09-23 DIAGNOSIS — N183 Chronic kidney disease, stage 3 unspecified: Secondary | ICD-10-CM

## 2016-09-23 DIAGNOSIS — Z9181 History of falling: Secondary | ICD-10-CM

## 2016-09-23 MED ORDER — VITAMIN D 50 MCG (2000 UT) PO CAPS: 1.0000 | ORAL_CAPSULE | Freq: Every day | ORAL | Status: DC

## 2016-09-23 NOTE — Assessment & Plan Note (Signed)
Preventative protocols reviewed and updated unless pt declined. Discussed healthy diet and lifestyle.  

## 2016-09-23 NOTE — Assessment & Plan Note (Signed)
Continues regularly using cane. He has not had any falls but has had a recent near fall - see HPI. Arm wound continues healing well.

## 2016-09-23 NOTE — Assessment & Plan Note (Signed)
Chronic, stable. Continue current regimen. 

## 2016-09-23 NOTE — Assessment & Plan Note (Signed)
rec he start 2000 IU daily.

## 2016-09-23 NOTE — Assessment & Plan Note (Signed)
Persistent afib on EKG today. Rate controlled. Will continue eliquis at this time. I asked pt to let us know if he gets into [facility] hole to discuss transition to coumadin.

## 2016-09-23 NOTE — Assessment & Plan Note (Signed)
Close f/u with derm/onc.

## 2016-09-23 NOTE — Assessment & Plan Note (Signed)
Appreciate heme/onc care.  

## 2016-09-23 NOTE — Progress Notes (Signed)
BP 134/70   Pulse 65   Temp 98 F (36.7 C) (Oral)   Ht _0  (1.702 m)   Wt 164 lb 4 oz (74.5 kg)   SpO2 98%   BMI 25.73 kg/m    CC: CPE Subjective:    Patient ID: Charles Rodgers, male    DOB: 1925/03/03, 83 y.o.   MRN: [id number]  HPI: Charles Rodgers is a 83 y.o. male presenting on 09/23/2016 for Annual Exam (Medicare pt 2)   89th birthday next month!  Saw Lesia last week for medicare wellness visit. Note reviewed. He did have prior fall at home, wound evaluated and dressed during AMW visit. Very hard of hearing - lost hearing aides. He continues to drive despite my recommendations against this.   Cares for wife with dementia.   Late last year he had newly found atrial fibrillation with slowed ventricular response. Declined cardiology evaluation and we started eliquis 2.49m bid. Concern for donut hole financial cost, may need to transition to coumadin in the future.   [facility]US followed by hematology/oncology (Mike Gip. H/o melanoma as well followed by dermatology. Missed latest appt due to time confusion.   Preventative: Colon cancer screening - aged out. Denies bm changes or blood in stool. Prostate cancer screening - discussed, will stop. Stopped seeing urologist (Dr SBernardo Heater. Prior followed for BPH. consider checking PSA next lab draw. Flu shot yearly Td 12/2011 Pneumovax 09/2011, prevnar 2015 Shingles shot - declines.  Advanced directives - has not set up yet. Would want brother RDelfino Lovettto help make medical decisions.With with memory issues. Declines prolonged life support. Wouldn't want to be kept alive if <50% chance would survive. Packet provided previously.  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 Interim medical history since our last visit reviewed. Allergies and medications reviewed and updated. Outpatient Medications Prior to Visit  Medication Sig Dispense Refill  . amLODipine (NORVASC) 5 MG tablet TAKE 1 TABLET BY MOUTH ONCE DAILY 90 tablet 2  . Blood Glucose Monitoring Suppl (ONE TOUCH ULTRA 2) W/DEVICE KIT     . Casanthranol-Docusate Sodium 30-100 MG CAPS Take by mouth daily.    . dorzolamide-timolol (COSOPT) 22.3-6.8 MG/ML ophthalmic solution Place 1 drop into both eyes 2 (two) times daily. 10 mL   . doxazosin (CARDURA) 8 MG tablet TAKE 1 TABLET BY MOUTH DAILY AT BEDTIME 90 tablet 1  . ELIQUIS 2.5 MG TABS tablet TAKE 1 TABLET BY MOUTH TWICE (2) DAILY 180 tablet 1  . ferrous sulfate 325 (65 FE) MG tablet TAKE 1 TABLET BY MOUTH ONCE A DAY WITH BREAKFAST. 90 tablet 1  . furosemide (LASIX) 20 MG tablet TAKE 1 TABLET BY MOUTH DAILY 30 tablet 6  . glucose blood (ONE TOUCH ULTRA TEST) test strip USE TO CHECK BLOOD SUGAR 3 TIMES DAILY Dx: E11.42 300 each 2  . latanoprost (XALATAN) 0.005 % ophthalmic solution Place 1 drop into both eyes at bedtime. 2.5 mL   . lisinopril-hydrochlorothiazide (PRINZIDE,ZESTORETIC) 20-25 MG tablet TAKE 1 TABLET BY MOUTH ONCE A DAY 90 tablet 2  . Multiple Vitamins-Minerals (PRESERVISION AREDS) CAPS Take 2 capsules by mouth daily.    .Glory RosebushDELICA LANCETS MISC Use as directed    .  potassium chloride (K-DUR) 10 MEQ tablet TAKE 1 TABLET BY MOUTH DAILY AS NEEDED 30 tablet 3  . pravastatin (PRAVACHOL) 40 MG tablet TAKE 1 TABLET BY MOUTH AT BEDTIME 90 tablet 0  . Vitamin D, Ergocalciferol, (DRISDOL) 50000 units CAPS capsule Take 50,000 Units by mouth every 30 (thirty) days.     No facility-administered medications prior to visit.      Per HPI unless specifically indicated in ROS section below Review of Systems  Constitutional: Negative for  activity change, appetite change, chills, fatigue, fever and unexpected weight change.  HENT: Negative for hearing loss.   Eyes: Negative for visual disturbance.  Respiratory: Negative for cough, chest tightness, shortness of breath and wheezing.   Cardiovascular: Negative for chest pain, palpitations and leg swelling.  Gastrointestinal: Negative for abdominal distention, abdominal pain, blood in stool, constipation, diarrhea, nausea and vomiting.  Genitourinary: Negative for difficulty urinating and hematuria.  Musculoskeletal: Negative for arthralgias, myalgias and neck pain.  Skin: Negative for rash.  Neurological: Negative for dizziness, seizures, syncope and headaches.  Hematological: Negative for adenopathy. Does not bruise/bleed easily.  Psychiatric/Behavioral: Negative for dysphoric mood. The patient is not nervous/anxious.        Objective:    BP 134/70   Pulse 65   Temp 98 F (36.7 C) (Oral)   Ht _0  (1.702 m)   Wt 164 lb 4 oz (74.5 kg)   SpO2 98%   BMI 25.73 kg/m   Wt Readings from Last 3 Encounters:  09/23/16 164 lb 4 oz (74.5 kg)  09/16/16 164 lb (74.4 kg)  08/17/16 157 lb (71.2 kg)    Physical Exam  Constitutional: He is oriented to person, place, and time. He appears well-developed and well-nourished. No distress.  HENT:  Head: Normocephalic and atraumatic.  Right Ear: Hearing, tympanic membrane, external ear and ear canal normal.  Left Ear: Hearing, tympanic membrane, external ear and ear canal normal.  Nose: Nose normal.  Mouth/Throat: Uvula is midline, oropharynx is clear and moist and mucous membranes are normal. No oropharyngeal exudate, posterior oropharyngeal edema or posterior oropharyngeal erythema.  Eyes: Conjunctivae and EOM are normal. Pupils are equal, round, and reactive to light. No scleral icterus.  Neck: Normal range of motion. Neck supple. No thyromegaly present.  Cardiovascular: Normal rate, normal heart sounds and intact distal pulses.  An  irregular rhythm present.  No murmur heard. Pulses:      Radial pulses are 2+ on the right side, and 2+ on the left side.  Slightly irregular  Pulmonary/Chest: Effort normal and breath sounds normal. No respiratory distress. He has no wheezes. He has no rales.  Abdominal: Soft. Bowel sounds are normal. He exhibits no distension and no mass. There is no tenderness. There is no rebound and no guarding.  Musculoskeletal: Normal range of motion. He exhibits no edema.  Lymphadenopathy:    He has no cervical adenopathy.  Neurological: He is alert and oriented to person, place, and time.  CN grossly intact, station and gait intact  Skin: Skin is warm and dry. No rash noted.  Psychiatric: He has a normal mood and affect. His behavior is normal. Judgment and thought content normal.  Nursing note and vitals reviewed.  Results for orders placed or performed in visit on 09/16/16  Renal function panel  Result Value Ref Range   Sodium 140 135 - 145 mEq/L   Potassium 4.1 3.5 - 5.1 mEq/L   Chloride 111 96 - 112 mEq/L   CO2 20 19 -  32 mEq/L   Calcium 8.9 8.4 - 10.5 mg/dL   Albumin 3.6 3.5 - 5.2 g/dL   BUN 81 (H) 6 - 23 mg/dL   Creatinine, Ser 1.90 (H) 0.40 - 1.50 mg/dL   Glucose, Bld 116 (H) 70 - 99 mg/dL   Phosphorus 4.1 2.3 - 4.6 mg/dL   GFR 35.58 (L) >60.00 mL/min  Lipid panel  Result Value Ref Range   Cholesterol 77 0 - 200 mg/dL   Triglycerides 47.0 0.0 - 149.0 mg/dL   HDL 31.50 (L) >39.00 mg/dL   VLDL 9.4 0.0 - 40.0 mg/dL   LDL Cholesterol 36 0 - 99 mg/dL   Total CHOL/HDL Ratio 2    NonHDL 45.41   Hemoglobin A1c  Result Value Ref Range   Hgb A1c MFr Bld 6.4 4.6 - 6.5 %  VITAMIN D 25 Hydroxy (Vit-D Deficiency, Fractures)  Result Value Ref Range   VITD 27.06 (L) 30.00 - 100.00 ng/mL   Lab Results  Component Value Date   HGB 9.5 (L) 08/17/2016    EKG - atrial fibrillation (no obvious p waves) rate 60s, normal axis, Q waves inferioseptally with delayed R wave progression unchanged  from prior, no acute ST/T changes    Assessment & Plan:   Problem List Items Addressed This Visit    Advanced care planning/counseling discussion    Advanced directives - has not set up yet. Would want brother [name] to help make medical decisions.With with memory issues. Declines prolonged life support. Wouldn't want to be kept alive if <50% chance would survive. Packet provided previously.       At risk for falls    Continues regularly using cane. He has not had any falls but has had a recent near fall - see HPI. Arm wound continues healing well.       Atrial fibrillation with slow ventricular response (HCC)    Persistent afib on EKG today. Rate controlled. Will continue eliquis at this time. I asked pt to let us know if he gets into donut hole to discuss transition to coumadin.       Relevant Orders   EKG 12-Lead (Completed)   CKD (chronic kidney disease) stage 3, GFR 30-59 ml/min    Chronic, stable. GFR remains low 30s. Discussed importance of good hydration status.       Health maintenance examination - Primary    Preventative protocols reviewed and updated unless pt declined. Discussed healthy diet and lifestyle.       Hearing loss    Lost L hearing aid. Ongoing marked difficulty with hearing. I encouraged he see audiologist for new set of hearing aides, discussed my concerns with his ability to safely drive due to hearing loss.      HLD (hyperlipidemia)    Chronic, stable. Continue current regimen.       HTN (hypertension)    Chronic, stable. Continue current regimen.       Malignant melanoma of left upper arm ([place])    Close f/u with derm/onc.      Monoclonal gammopathy of unknown significance ([facility])    Appreciate heme onc care.       Vitamin D deficiency    rec he start 2000 IU daily.       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 Continue diet control. Actually in prediabetes range this visit.            Follow up plan: Return in about 6  months (around 03/25/2017) for follow up visit.  [name], MD

## 2016-09-23 NOTE — Assessment & Plan Note (Signed)
Chronic, stable. GFR remains low 30s. Discussed importance of good hydration status.

## 2016-09-23 NOTE — Assessment & Plan Note (Signed)
Lost L hearing aid. Ongoing marked difficulty with hearing. I encouraged he see audiologist for new set of hearing aides, discussed my concerns with his ability to safely drive due to hearing loss.

## 2016-09-23 NOTE — Assessment & Plan Note (Addendum)
Chronic, stable. Continue diet control. Actually in prediabetes range this visit.

## 2016-09-23 NOTE — Patient Instructions (Addendum)
Update EKG today.  Return to see hearing doctor - I'm worried about safety driving with trouble hearing.  Think about setting up living will - packet provided today.  Start vitamin D over the counter 2000 units daily Sugars are doing well. Return as needed or in 6 months for follow up. Continue eliquis blood thinner, let us know when you go into donut hole to consider changing medicine to coumadin (more frequent blood checks).    Health Maintenance, Male A healthy lifestyle and preventive care is important for your health and wellness. Ask your health care provider about what schedule of regular examinations is right for you. What should I know about weight and diet? Eat a Healthy Diet  Eat plenty of vegetables, fruits, whole grains, low-fat dairy products, and lean protein.  Do not eat a lot of foods high in solid fats, added sugars, or salt.  Maintain a Healthy Weight Regular exercise can help you achieve or maintain a healthy weight. You should:  Do at least 150 minutes of exercise each week. The exercise should increase your heart rate and make you sweat (moderate-intensity exercise).  Do strength-training exercises at least twice a week.  Watch Your Levels of Cholesterol and Blood Lipids  Have your blood tested for lipids and cholesterol every 5 years starting at 81 years of age. If you are at high risk for heart disease, you should start having your blood tested when you are 81 years old. You may need to have your cholesterol levels checked more often if: ? Your lipid or cholesterol levels are high. ? You are older than 81 years of age. ? You are at high risk for heart disease.  What should I know about cancer screening? Many types of cancers can be detected early and may often be prevented. Lung Cancer  You should be screened every year for lung cancer if: ? You are a current smoker who has smoked for at least 30 years. ? You are a former smoker who has quit within the  past 15 years.  Talk to your health care provider about your screening options, when you should start screening, and how often you should be screened.  Colorectal Cancer  Routine colorectal cancer screening usually begins at 81 years of age and should be repeated every 5-10 years until you are 81 years old. You may need to be screened more often if early forms of precancerous polyps or small growths are found. Your health care provider may recommend screening at an earlier age if you have risk factors for colon cancer.  Your health care provider may recommend using home test kits to check for hidden blood in the stool.  A small camera at the end of a tube can be used to examine your colon (sigmoidoscopy or colonoscopy). This checks for the earliest forms of colorectal cancer.  Prostate and Testicular Cancer  Depending on your age and overall health, your health care provider may do certain tests to screen for prostate and testicular cancer.  Talk to your health care provider about any symptoms or concerns you have about testicular or prostate cancer.  Skin Cancer  Check your skin from head to toe regularly.  Tell your health care provider about any new moles or changes in moles, especially if: ? There is a change in a mole's size, shape, or color. ? You have a mole that is larger than a pencil eraser.  Always use sunscreen. Apply sunscreen liberally and repeat throughout the day.  Protect yourself by wearing long sleeves, pants, a wide-brimmed hat, and sunglasses when outside.  What should I know about heart disease, diabetes, and high blood pressure?  If you are 58-27 years of age, have your blood pressure checked every 3-5 years. If you are 66 years of age or older, have your blood pressure checked every year. You should have your blood pressure measured twice-once when you are at a hospital or clinic, and once when you are not at a hospital or clinic. Record the average of the two  measurements. To check your blood pressure when you are not at a hospital or clinic, you can use: ? An automated blood pressure machine at a pharmacy. ? A home blood pressure monitor.  Talk to your health care provider about your target blood pressure.  If you are between 28-77 years old, ask your health care provider if you should take aspirin to prevent heart disease.  Have regular diabetes screenings by checking your fasting blood sugar level. ? If you are at a normal weight and have a low risk for diabetes, have this test once every three years after the age of 50. ? If you are overweight and have a high risk for diabetes, consider being tested at a younger age or more often.  A one-time screening for abdominal aortic aneurysm (AAA) by ultrasound is recommended for men aged 76-75 years who are current or former smokers. What should I know about preventing infection? Hepatitis B If you have a higher risk for hepatitis B, you should be screened for this virus. Talk with your health care provider to find out if you are at risk for hepatitis B infection. Hepatitis C Blood testing is recommended for:  Everyone born from [birth year] through 1963.  Anyone with known risk factors for hepatitis C.  Sexually Transmitted Diseases (STDs)  You should be screened each year for STDs including gonorrhea and chlamydia if: ? You are sexually active and are younger than 81 years of age. ? You are older than 81 years of age and your health care provider tells you that you are at risk for this type of infection. ? Your sexual activity has changed since you were last screened and you are at an increased risk for chlamydia or gonorrhea. Ask your health care provider if you are at risk.  Talk with your health care provider about whether you are at high risk of being infected with HIV. Your health care provider may recommend a prescription medicine to help prevent HIV infection.  What else can I do?  Schedule  regular health, dental, and eye exams.  Stay current with your vaccines (immunizations).  Do not use any tobacco products, such as cigarettes, chewing tobacco, and e-cigarettes. If you need help quitting, ask your health care provider.  Limit alcohol intake to no more than 2 drinks per day. One drink equals 12 ounces of beer, 5 ounces of wine, or 1 ounces of hard liquor.  Do not use street drugs.  Do not share needles.  Ask your health care provider for help if you need support or information about quitting drugs.  Tell your health care provider if you often feel depressed.  Tell your health care provider if you have ever been abused or do not feel safe at home. This information is not intended to replace advice given to you by your health care provider. Make sure you discuss any questions you have with your health care provider. Document Released: 09/12/2007 Document  Revised: 11/13/2015 Document Reviewed: 12/18/2014 Elsevier Interactive Patient Education  [employer].

## 2016-09-23 NOTE — Assessment & Plan Note (Addendum)
Advanced directives - has not set up yet. Would want brother [name] to help make medical decisions.With with memory issues. Declines prolonged life support. Wouldn't want to be kept alive if <50% chance would survive. Packet provided previously.

## 2016-10-07 DIAGNOSIS — L821 Other seborrheic keratosis: Secondary | ICD-10-CM | POA: Diagnosis not present

## 2016-10-07 DIAGNOSIS — L570 Actinic keratosis: Secondary | ICD-10-CM | POA: Diagnosis not present

## 2016-10-07 DIAGNOSIS — Z85820 Personal history of malignant melanoma of skin: Secondary | ICD-10-CM | POA: Diagnosis not present

## 2016-10-07 DIAGNOSIS — Z859 Personal history of malignant neoplasm, unspecified: Secondary | ICD-10-CM | POA: Diagnosis not present

## 2016-10-07 DIAGNOSIS — Z85828 Personal history of other malignant neoplasm of skin: Secondary | ICD-10-CM | POA: Diagnosis not present

## 2016-10-09 ENCOUNTER — Other Ambulatory Visit: Payer: Self-pay | Admitting: Hematology and Oncology

## 2016-10-09 ENCOUNTER — Encounter: Payer: Self-pay | Admitting: Hematology and Oncology

## 2016-10-09 ENCOUNTER — Inpatient Hospital Stay (HOSPITAL_BASED_OUTPATIENT_CLINIC_OR_DEPARTMENT_OTHER): Payer: Medicare Other | Admitting: Hematology and Oncology

## 2016-10-09 ENCOUNTER — Inpatient Hospital Stay: Payer: Medicare Other | Attending: Hematology and Oncology

## 2016-10-09 VITALS — BP 135/60 | HR 69 | Temp 97.2°F | Resp 20 | Wt 164.2 lb

## 2016-10-09 DIAGNOSIS — H409 Unspecified glaucoma: Secondary | ICD-10-CM

## 2016-10-09 DIAGNOSIS — M1990 Unspecified osteoarthritis, unspecified site: Secondary | ICD-10-CM | POA: Diagnosis not present

## 2016-10-09 DIAGNOSIS — N2581 Secondary hyperparathyroidism of renal origin: Secondary | ICD-10-CM

## 2016-10-09 DIAGNOSIS — E785 Hyperlipidemia, unspecified: Secondary | ICD-10-CM

## 2016-10-09 DIAGNOSIS — Z880 Allergy status to penicillin: Secondary | ICD-10-CM

## 2016-10-09 DIAGNOSIS — D472 Monoclonal gammopathy: Secondary | ICD-10-CM | POA: Insufficient documentation

## 2016-10-09 DIAGNOSIS — Z87891 Personal history of nicotine dependence: Secondary | ICD-10-CM | POA: Insufficient documentation

## 2016-10-09 DIAGNOSIS — I129 Hypertensive chronic kidney disease with stage 1 through stage 4 chronic kidney disease, or unspecified chronic kidney disease: Secondary | ICD-10-CM | POA: Insufficient documentation

## 2016-10-09 DIAGNOSIS — Z800 Family history of malignant neoplasm of digestive organs: Secondary | ICD-10-CM | POA: Insufficient documentation

## 2016-10-09 DIAGNOSIS — N183 Chronic kidney disease, stage 3 (moderate): Secondary | ICD-10-CM | POA: Diagnosis not present

## 2016-10-09 DIAGNOSIS — D631 Anemia in chronic kidney disease: Secondary | ICD-10-CM

## 2016-10-09 DIAGNOSIS — N400 Enlarged prostate without lower urinary tract symptoms: Secondary | ICD-10-CM | POA: Insufficient documentation

## 2016-10-09 DIAGNOSIS — E1122 Type 2 diabetes mellitus with diabetic chronic kidney disease: Secondary | ICD-10-CM

## 2016-10-09 DIAGNOSIS — D696 Thrombocytopenia, unspecified: Secondary | ICD-10-CM | POA: Diagnosis not present

## 2016-10-09 DIAGNOSIS — Z79899 Other long term (current) drug therapy: Secondary | ICD-10-CM

## 2016-10-09 DIAGNOSIS — Z7901 Long term (current) use of anticoagulants: Secondary | ICD-10-CM | POA: Insufficient documentation

## 2016-10-09 DIAGNOSIS — D649 Anemia, unspecified: Secondary | ICD-10-CM | POA: Diagnosis not present

## 2016-10-09 DIAGNOSIS — D4621 Refractory anemia with excess of blasts 1: Secondary | ICD-10-CM | POA: Diagnosis not present

## 2016-10-09 DIAGNOSIS — J449 Chronic obstructive pulmonary disease, unspecified: Secondary | ICD-10-CM

## 2016-10-09 DIAGNOSIS — N184 Chronic kidney disease, stage 4 (severe): Principal | ICD-10-CM

## 2016-10-09 DIAGNOSIS — Z85820 Personal history of malignant melanoma of skin: Secondary | ICD-10-CM | POA: Insufficient documentation

## 2016-10-09 LAB — COMPREHENSIVE METABOLIC PANEL
ALT: 16 U/L — ABNORMAL LOW (ref 17–63)
AST: 21 U/L (ref 15–41)
Albumin: 3.5 g/dL (ref 3.5–5.0)
Alkaline Phosphatase: 81 U/L (ref 38–126)
Anion gap: 7 (ref 5–15)
BUN: 72 mg/dL — ABNORMAL HIGH (ref 6–20)
CO2: 18 mmol/L — ABNORMAL LOW (ref 22–32)
Calcium: 8.7 mg/dL — ABNORMAL LOW (ref 8.9–10.3)
Chloride: 111 mmol/L (ref 101–111)
Creatinine, Ser: 1.88 mg/dL — ABNORMAL HIGH (ref 0.61–1.24)
GFR calc Af Amer: 35 mL/min — ABNORMAL LOW (ref >60)
GFR calc non Af Amer: 30 mL/min — ABNORMAL LOW (ref >60)
Glucose, Bld: 116 mg/dL — ABNORMAL HIGH (ref 65–99)
Potassium: 3.8 mmol/L (ref 3.5–5.1)
Sodium: 136 mmol/L (ref 135–145)
Total Bilirubin: 0.4 mg/dL (ref 0.3–1.2)
Total Protein: 6.6 g/dL (ref 6.5–8.1)

## 2016-10-09 LAB — CBC WITH DIFFERENTIAL/PLATELET
Basophils Absolute: 0 10*3/uL (ref 0–0.1)
Basophils Relative: 0 %
Eosinophils Absolute: 0.1 10*3/uL (ref 0–0.7)
Eosinophils Relative: 1 %
HCT: 25.5 % — ABNORMAL LOW (ref 40.0–52.0)
Hemoglobin: 8.6 g/dL — ABNORMAL LOW (ref 13.0–18.0)
Lymphocytes Relative: 9 %
Lymphs Abs: 0.9 10*3/uL — ABNORMAL LOW (ref 1.0–3.6)
MCH: 32.1 pg (ref 26.0–34.0)
MCHC: 33.6 g/dL (ref 32.0–36.0)
MCV: 95.3 fL (ref 80.0–100.0)
Monocytes Absolute: 0.7 10*3/uL (ref 0.2–1.0)
Monocytes Relative: 7 %
Neutro Abs: 8.4 10*3/uL — ABNORMAL HIGH (ref 1.4–6.5)
Neutrophils Relative %: 83 %
Platelets: 79 10*3/uL — ABNORMAL LOW (ref 150–440)
RBC: 2.67 MIL/uL — ABNORMAL LOW (ref 4.40–5.90)
RDW: 14.4 % (ref 11.5–14.5)
WBC: 10 10*3/uL (ref 3.8–10.6)

## 2016-10-09 LAB — FERRITIN: Ferritin: 45 ng/mL (ref 24–336)

## 2016-10-09 NOTE — Progress Notes (Signed)
[facility] day:   10/09/2016   Chief Complaint: Charles Rodgers is an 83 y.o. male with anemia, thrombocytopenia, chronic renal insufficiency, and a monoclonal gammopathy who is seen for 2 month assessment.  HPI: The patient was last seen in the medical oncology clinic on 08/17/2016.  At that time, energy level was low.  He had stopped Meals on Wheels.  He was dependent on his daughter to bring meals as his wife has Alzheimer's and was no longer cooking.  Labs at last visit included a hematocrit 28.8, hemoglobin 9.5, MCV 96.9, platelets 71,000, white count 5900 with an [place] of 4600.  B12 was 626. Folate was 27.0.  TSH was 1.8.  Retic was 0.8%.  Epo level was 12.9.  ANA was negative.  Creatinine was 1.90 (CrCl 24.6 ml/min) on 09/16/2016.  Ferritin was 48 on 08/06/2016.  SPEP was 0.3 gm/dL on 05/07/2016.  Symptomatically, he feels good.  He is eating better.  He is eating some meals from restaurants.  He has gained 7 pounds.  He denies any nausea or vomiting.   Past Medical History:  Diagnosis Date  . Arthritis   . BPH (benign prostatic hypertrophy)    with elevated PSA, s/p benign biopsy, followed yearly by [facility] (last seen 01/16/2013)  . CKD (chronic kidney disease) stage 3, GFR 30-59 ml/min    [place] -> 2017 requested PCP start following  . COPD (chronic obstructive pulmonary disease) ([place]) 2016   by xray  . Diabetes mellitus type 2, controlled ([place])   . Glaucoma   . Hearing loss   . HLD (hyperlipidemia)   . HTN (hypertension)   . IgM monoclonal gammopathy of uncertain significance 08/27/2014   07/2014 suggest rpt 6 mo.  . Malignant melanoma of left upper arm ([place]) 11/14/2014   s/p excision complicated by infected axillary seroma (Dr Carmin Muskrat)  . Melanoma ([place]) 2014   nose and left hand  . Secondary hyperparathyroidism ([place])   . Subclavian artery stenosis, right ([place]) 2014   found on carotid US, seen [facility], recommended recheck 1  yr at vascular lab with Dr. Delana Meyer  . Thyroid nodule 02/2013   right upper lobe - s/p biopsy - benign follicular nodule  . Urine incontinence     Past Surgical History:  Procedure Laterality Date  . APPENDECTOMY  1938  . arm surgery     Right after trauma  . BIOPSY THYROID Right 08/2374   benign follicular nodule  . carotid US  07/2007   WNL  . 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 US ECHOCARDIOGRAPHY  06/2007   EF 55%, mod dil LA, mild LVH,mild pulm HTN    Family History  Problem Relation Age of Onset  . Cancer Mother        stomach  . Cancer Brother        colon  . Cancer Brother        colon  . Cancer Sister        hodgkin's lymphoma  . Cancer Brother        prostate  . Diabetes Brother   . Coronary artery disease Neg Hx   . Stroke Neg Hx     Social History:  reports that he quit smoking about 49 years ago. His smoking use included Cigarettes. He has a 7.50 pack-year smoking history. He has never used smokeless tobacco. He reports that he does not drink  alcohol or use drugs.  His wife has Alzheimer's.  His daughter, Loletha Carrow, brings food to the house.  His brother's phone number is:  [phone].  The patient is accompanied by his brother, [name].  Allergies: No Known Allergies  Current Medications: Current Outpatient Prescriptions  Medication Sig Dispense Refill  . amLODipine (NORVASC) 5 MG tablet TAKE 1 TABLET BY MOUTH ONCE DAILY 90 tablet 2  . Blood Glucose Monitoring Suppl (ONE TOUCH ULTRA 2) W/DEVICE KIT     . Casanthranol-Docusate Sodium 30-100 MG CAPS Take by mouth daily.    . Cholecalciferol (VITAMIN D) 2000 units CAPS Take 1 capsule (2,000 Units total) by mouth daily. 30 capsule   . dorzolamide-timolol (COSOPT) 22.3-6.8 MG/ML ophthalmic solution Place 1 drop into both eyes 2 (two) times daily. 10 mL   . doxazosin (CARDURA) 8 MG tablet TAKE 1 TABLET BY MOUTH DAILY AT BEDTIME 90 tablet 1  . ELIQUIS 2.5 MG TABS  tablet TAKE 1 TABLET BY MOUTH TWICE (2) DAILY 180 tablet 1  . ferrous sulfate 325 (65 FE) MG tablet TAKE 1 TABLET BY MOUTH ONCE A DAY WITH BREAKFAST. 90 tablet 1  . furosemide (LASIX) 20 MG tablet TAKE 1 TABLET BY MOUTH DAILY 30 tablet 6  . glucose blood (ONE TOUCH ULTRA TEST) test strip USE TO CHECK BLOOD SUGAR 3 TIMES DAILY Dx: E11.42 300 each 2  . latanoprost (XALATAN) 0.005 % ophthalmic solution Place 1 drop into both eyes at bedtime. 2.5 mL   . lisinopril-hydrochlorothiazide (PRINZIDE,ZESTORETIC) 20-25 MG tablet TAKE 1 TABLET BY MOUTH ONCE A DAY 90 tablet 2  . Multiple Vitamins-Minerals (PRESERVISION AREDS) CAPS Take 2 capsules by mouth daily.    Glory Rosebush DELICA LANCETS MISC Use as directed    . potassium chloride (K-DUR) 10 MEQ tablet TAKE 1 TABLET BY MOUTH DAILY AS NEEDED 30 tablet 3  . pravastatin (PRAVACHOL) 40 MG tablet TAKE 1 TABLET BY MOUTH AT BEDTIME 90 tablet 0   No current facility-administered medications for this visit.     Review of Systems:  GENERAL:  Feels better.  No fevers or sweats .  Weight up 7 pounds. PERFORMANCE STATUS (ECOG):  1 HEENT:  Hard of hearing.  No visual changes, runny nose, sore throat, mouth sores or tenderness. Lungs: No shortness of breath or cough.  No hemoptysis. Cardiac:  No chest pain, palpitations, orthopnea, or PND. GI:  No nausea, vomiting, diarrhea, constipation, melena or hematochezia. GU:  No urgency, frequency, dysuria, or hematuria. Musculoskeletal:  No back pain.  Arthritis pain.  No muscle tenderness. Extremities:  No pain or swelling. Skin:  No rashes or skin changes. Neuro:  No headache, numbness or weakness, balance or coordination issues. Endocrine:  No diabetes, thyroid issues, hot flashes or night sweats. Psych:  No mood changes, depression or anxiety. Pain:  No focal pain. Review of systems:  All other systems reviewed and found to be negative.   Physical Exam:  Blood pressure 135/60, pulse 69, temperature (!) 97.2 F  (36.2 C), temperature source Tympanic, resp. rate 20, weight 164 lb 3 oz (74.5 kg). GENERAL:  Well developed, well nourished, elderly gentleman sitting comfortably in the exam room in no acute distress. MENTAL STATUS:  Alert and oriented to person, place and time. HEAD:  Wearing a cap.  White hair.  Male pattern baldness.  Normocephalic, atraumatic, face symmetric, no Cushingoid features. EYES:  Glasses.  Hazel/brown eyes.  Pupils equal round and reactive to light and accomodation.  No conjunctivitis or scleral icterus. ENT:  Oropharynx clear without lesion.  Tongue normal. Mucous membranes moist.  RESPIRATORY:  Clear to auscultation without rales, wheezes or rhonchi. CARDIOVASCULAR:  Regular rate and rhythm without murmur, rub or gallop. ABDOMEN:  Ventral hernia.  Soft, non-tender, with active bowel sounds, and no hepatosplenomegaly.  No masses. SKIN:  Skin changes due to sun exposure.  No rashes, ulcers or lesions. EXTREMITIES: Trace ankle edema.  No skin discoloration or tenderness.  No palpable cords. LYMPH NODES: No palpable cervical, supraclavicular, axillary or inguinal adenopathy  NEUROLOGICAL: Unremarkable. PSYCH:  Appropriate.    Appointment on 10/09/2016  Component Date Value Ref Range Status  . Sodium 10/09/2016 136  135 - 145 mmol/L Final  . Potassium 10/09/2016 3.8  3.5 - 5.1 mmol/L Final  . Chloride 10/09/2016 111  101 - 111 mmol/L Final  . CO2 10/09/2016 18* 22 - 32 mmol/L Final  . Glucose, Bld 10/09/2016 116* 65 - 99 mg/dL Final  . BUN 10/09/2016 72* 6 - 20 mg/dL Final  . Creatinine, Ser 10/09/2016 1.88* 0.61 - 1.24 mg/dL Final  . Calcium 10/09/2016 8.7* 8.9 - 10.3 mg/dL Final  . Total Protein 10/09/2016 6.6  6.5 - 8.1 g/dL Final  . Albumin 10/09/2016 3.5  3.5 - 5.0 g/dL Final  . AST 10/09/2016 21  15 - 41 U/L Final  . ALT 10/09/2016 16* 17 - 63 U/L Final  . Alkaline Phosphatase 10/09/2016 81  38 - 126 U/L Final  . Total Bilirubin 10/09/2016 0.4  0.3 - 1.2 mg/dL Final   . GFR calc non Af Amer 10/09/2016 30* >60 mL/min Final  . GFR calc Af Amer 10/09/2016 35* >60 mL/min Final   Comment: (NOTE) The eGFR has been calculated using the CKD EPI equation. This calculation has not been validated in all clinical situations. eGFR's persistently <60 mL/min signify possible Chronic Kidney Disease.   . Anion gap 10/09/2016 7  5 - 15 Final  . WBC 10/09/2016 10.0  3.8 - 10.6 K/uL Final  . RBC 10/09/2016 2.67* 4.40 - 5.90 MIL/uL Final  . Hemoglobin 10/09/2016 8.6* 13.0 - 18.0 g/dL Final  . HCT 10/09/2016 25.5* 40.0 - 52.0 % Final  . MCV 10/09/2016 95.3  80.0 - 100.0 fL Final  . MCH 10/09/2016 32.1  26.0 - 34.0 pg Final  . MCHC 10/09/2016 33.6  32.0 - 36.0 g/dL Final  . RDW 10/09/2016 14.4  11.5 - 14.5 % Final  . Platelets 10/09/2016 79* 150 - 440 K/uL Final  . Neutrophils Relative % 10/09/2016 83  % Final  . Neutro Abs 10/09/2016 8.4* 1.4 - 6.5 K/uL Final  . Lymphocytes Relative 10/09/2016 9  % Final  . Lymphs Abs 10/09/2016 0.9* 1.0 - 3.6 K/uL Final  . Monocytes Relative 10/09/2016 7  % Final  . Monocytes Absolute 10/09/2016 0.7  0.2 - 1.0 K/uL Final  . Eosinophils Relative 10/09/2016 1  % Final  . Eosinophils Absolute 10/09/2016 0.1  0 - 0.7 K/uL Final  . Basophils Relative 10/09/2016 0  % Final  . Basophils Absolute 10/09/2016 0.0  0 - 0.1 K/uL Final    Assessment:  Decker Cogdell is an 83 y.o. male with mild cytopenias likely multi-factorial in nature (renal insufficiency and anemia of chronic disease).  He has a 2 year history of a normocytic anemia, thrombocytopenia and a normal white count.    He has a monoclonal gammopathy of unknown significance (MGUS).   Immunoglobulin levels were normal.  24 hour UPEP revealed no monoclonal protein on 09/14/2014  SPEP has been followed: 0.3 gm/dL on 08/15/2014, 0.4 gm.dL on 09/11/2014, 0.3 gm/dL on 05/07/2016, and 0.2 gm/dL on 10/09/2016.   Kappa free light chains have been followed:  147.5 (ratio 4.56) on  09/11/2014, 153.6 (ratio 3.35) on 05/07/2016, and 182 (ratio 3.28) on 10/09/2016.  Bone survey on 10/03/2014 revealed subtle lucencies within the calvarium, left femoral head, and proximal left tibia which could reflect myelomatous involvement.  Bone survey on 10/03/2015 revealed no definite radiolucent lesions. There was diffuse osteopenia.  There were multiple old healed right lateral rib fractures.  Bone marrow aspirate and biopsy on 10/15/2015 revealed no significant dysplasia or abnormal or overt neoplasia. Marrow was slightly hypercellular for age (40%) with maturing trilineage hematopoiesis, relatively diminished erythroid precursors, and adequate megakaryocytes. There was marginally increased polytypic plasma cells (5%). There was diffuse mild increase in reticulin. There were trace iron stores.  Flow cytometry revealed a low level monoclonal B population (1%) with nonspecific immunophenotype.  Cytogenetics were normal (46, XY) with a normal SNP microarray.   He has chronic renal insufficiency (Cr 1.7 - 2.03) over the past 2 years without trend.  Creatinine was 1.90 (CrCl 24.6 ml/min) on 09/16/2016.  He denies any new medications or herbal products.  Ferritin was 48 on 08/06/2016.  Peripheral smear on 08/27/2014 revealed no abnormal white blood cells, thrombocytopenia without clumping and some large platelets. There were elliptocytes, teardrop red blood cells, and schistocytes. Teardrop RBCs and elliptocytes suggest possible extramedullary hematopoiesis (myelofibrosis).  Myelodysplastic syndrome with deletion 20q is a consideration.  Work-up on 09/11/2014 included CBC with a hematocrit 30.4, hemoglobin 9.7, MCV 95.1, platelets 80,000, white count 5300 with an [place] of 3800. Differential was unremarkable.  B12, folate, TSH, ANA, PT, PTT, LDH.  Reticulocyte count was low at 3.19%. Ferritin was 47 with a iron saturation of 20% and a TIBC of 265 (normal).  B12 and folate were normal on 08/17/2016.  Retic  was 0.8% on 08/17/2016.  Epo level was 12.9 on 08/17/2016.   Diet has been poor until recently.  He is on ferrous sulfate.  He denies any melena or hematochezia.  He has never had a colonoscopy.  Guaiac cards were negative 2 on 09/16/2014.   Symptomatically, he feels better.  He is gaining weight.  Exam reveals no adenopathy or hepatosplenomegaly.  Hematocrit is 25.5 with a hemoglobin of 8.6.  Platelet count is 79,000.    Plan: 1.  Labs today:  CBC with diff, CMP, ferritin, SPEP, FLCA, retic. 2.  Discuss anemia and work-up at last visit.  Etiology of anemia likely secondary to renal insufficiency.  Epo level is low.  Discuss Procrit.  Side effects reviewed. 3.  RN or MD to call patient's brother [name]) with lab results. 4.  Confirm preauth Procrit. 5.  RTC in 1 week for Hgb +/- Procrit. 6.  RTC every 2 weeks starting next week for Hgb +/- Procrit. 7.  RTC in 6 weeks (coordinate with above) for MD assessment, labs (CBC with diff, ferritin), and +/- Procrit.   Lequita Asal, MD  10/09/2016 , 10:59 AM

## 2016-10-09 NOTE — Progress Notes (Signed)
Patient offers no complaints today.  Patient got in the floor a few days ago (did not fall) to get something he dropped.  States his stomach is sore from having to get up from the floor.  Otherwise, no complaints.

## 2016-10-12 LAB — KAPPA/LAMBDA LIGHT CHAINS
Kappa free light chain: 182.3 mg/L — ABNORMAL HIGH (ref 3.3–19.4)
Kappa, lambda light chain ratio: 3.28 — ABNORMAL HIGH (ref 0.26–1.65)
Lambda free light chains: 55.5 mg/L — ABNORMAL HIGH (ref 5.7–26.3)

## 2016-10-14 ENCOUNTER — Encounter: Payer: Self-pay | Admitting: Podiatry

## 2016-10-14 ENCOUNTER — Ambulatory Visit (INDEPENDENT_AMBULATORY_CARE_PROVIDER_SITE_OTHER): Payer: Medicare Other | Admitting: Podiatry

## 2016-10-14 DIAGNOSIS — M79676 Pain in unspecified toe(s): Secondary | ICD-10-CM | POA: Diagnosis not present

## 2016-10-14 DIAGNOSIS — B351 Tinea unguium: Secondary | ICD-10-CM

## 2016-10-14 NOTE — Progress Notes (Signed)
   Subjective:    Patient ID: Charles Rodgers, male    DOB: Jan 04, 1925, 83 y.o.   MRN: [id number]  HPI: He presents today after an injury to his right hallux nail. States that there was a lot of bleeding. His toenails are long and he would like to have it looked at.  Review of Systems  All other systems reviewed and are negative.      Objective:   Physical Exam pulses are palpable. Splint to the hallux nail right. Toenails are long thick yellow dystrophic mycotic.        Assessment & Plan:  Painful elongated nails with a splint in the hallux nail right. No signs of infection.  Plan: Debrided all of his nails bilateral. Follow up with [place] as needed.

## 2016-10-15 LAB — PROTEIN ELECTROPHORESIS, SERUM
A/G Ratio: 1.3 (ref 0.7–1.7)
Albumin ELP: 3.5 g/dL (ref 2.9–4.4)
Alpha-1-Globulin: 0.2 g/dL (ref 0.0–0.4)
Alpha-2-Globulin: 0.7 g/dL (ref 0.4–1.0)
Beta Globulin: 0.8 g/dL (ref 0.7–1.3)
Gamma Globulin: 1.1 g/dL (ref 0.4–1.8)
Globulin, Total: 2.7 g/dL (ref 2.2–3.9)
M-Spike, %: 0.2 g/dL — ABNORMAL HIGH
Total Protein ELP: 6.2 g/dL (ref 6.0–8.5)

## 2016-10-16 ENCOUNTER — Inpatient Hospital Stay: Payer: Medicare Other

## 2016-10-16 ENCOUNTER — Telehealth: Payer: Self-pay

## 2016-10-16 VITALS — BP 154/73

## 2016-10-16 DIAGNOSIS — I129 Hypertensive chronic kidney disease with stage 1 through stage 4 chronic kidney disease, or unspecified chronic kidney disease: Secondary | ICD-10-CM | POA: Diagnosis not present

## 2016-10-16 DIAGNOSIS — N184 Chronic kidney disease, stage 4 (severe): Principal | ICD-10-CM

## 2016-10-16 DIAGNOSIS — D4621 Refractory anemia with excess of blasts 1: Secondary | ICD-10-CM | POA: Diagnosis not present

## 2016-10-16 DIAGNOSIS — D631 Anemia in chronic kidney disease: Secondary | ICD-10-CM

## 2016-10-16 DIAGNOSIS — D696 Thrombocytopenia, unspecified: Secondary | ICD-10-CM

## 2016-10-16 DIAGNOSIS — D472 Monoclonal gammopathy: Secondary | ICD-10-CM

## 2016-10-16 DIAGNOSIS — N183 Chronic kidney disease, stage 3 (moderate): Secondary | ICD-10-CM | POA: Diagnosis not present

## 2016-10-16 DIAGNOSIS — N2581 Secondary hyperparathyroidism of renal origin: Secondary | ICD-10-CM | POA: Diagnosis not present

## 2016-10-16 DIAGNOSIS — E1122 Type 2 diabetes mellitus with diabetic chronic kidney disease: Secondary | ICD-10-CM | POA: Diagnosis not present

## 2016-10-16 LAB — HEMOGLOBIN: Hemoglobin: 8.3 g/dL — ABNORMAL LOW (ref 13.0–18.0)

## 2016-10-16 MED ORDER — EPOETIN ALFA 10000 UNIT/ML IJ SOLN
10000.0000 [IU] | Freq: Once | INTRAMUSCULAR | Status: AC
  Administered 2016-10-16: 10000 [IU] via SUBCUTANEOUS
  Filled 2016-10-16: qty 2

## 2016-10-16 NOTE — Telephone Encounter (Signed)
Patient's brother is requesting a call with lab results from 10/09/16.

## 2016-10-16 NOTE — Telephone Encounter (Signed)
  OK to call patient with lab results.  M

## 2016-10-19 DIAGNOSIS — E119 Type 2 diabetes mellitus without complications: Secondary | ICD-10-CM | POA: Diagnosis not present

## 2016-10-19 DIAGNOSIS — H401131 Primary open-angle glaucoma, bilateral, mild stage: Secondary | ICD-10-CM | POA: Diagnosis not present

## 2016-10-19 LAB — HM DIABETES EYE EXAM

## 2016-10-19 NOTE — Telephone Encounter (Signed)
Patient is unable to hear.  Labs printed and sent by mail to patient.

## 2016-10-23 ENCOUNTER — Other Ambulatory Visit: Payer: Self-pay | Admitting: Family Medicine

## 2016-10-23 ENCOUNTER — Encounter: Payer: Self-pay | Admitting: Family Medicine

## 2016-10-30 ENCOUNTER — Inpatient Hospital Stay: Payer: Medicare Other | Attending: Hematology and Oncology

## 2016-10-30 ENCOUNTER — Inpatient Hospital Stay: Payer: Medicare Other

## 2016-10-30 ENCOUNTER — Other Ambulatory Visit: Payer: Self-pay

## 2016-10-30 VITALS — BP 149/70 | HR 71

## 2016-10-30 DIAGNOSIS — J449 Chronic obstructive pulmonary disease, unspecified: Secondary | ICD-10-CM | POA: Insufficient documentation

## 2016-10-30 DIAGNOSIS — N400 Enlarged prostate without lower urinary tract symptoms: Secondary | ICD-10-CM | POA: Diagnosis not present

## 2016-10-30 DIAGNOSIS — Z87891 Personal history of nicotine dependence: Secondary | ICD-10-CM | POA: Insufficient documentation

## 2016-10-30 DIAGNOSIS — N183 Chronic kidney disease, stage 3 (moderate): Secondary | ICD-10-CM | POA: Diagnosis not present

## 2016-10-30 DIAGNOSIS — Z85820 Personal history of malignant melanoma of skin: Secondary | ICD-10-CM | POA: Diagnosis not present

## 2016-10-30 DIAGNOSIS — E785 Hyperlipidemia, unspecified: Secondary | ICD-10-CM | POA: Insufficient documentation

## 2016-10-30 DIAGNOSIS — D4621 Refractory anemia with excess of blasts 1: Secondary | ICD-10-CM | POA: Insufficient documentation

## 2016-10-30 DIAGNOSIS — D631 Anemia in chronic kidney disease: Secondary | ICD-10-CM

## 2016-10-30 DIAGNOSIS — D472 Monoclonal gammopathy: Secondary | ICD-10-CM | POA: Diagnosis not present

## 2016-10-30 DIAGNOSIS — D696 Thrombocytopenia, unspecified: Secondary | ICD-10-CM | POA: Diagnosis not present

## 2016-10-30 DIAGNOSIS — N184 Chronic kidney disease, stage 4 (severe): Principal | ICD-10-CM

## 2016-10-30 DIAGNOSIS — H409 Unspecified glaucoma: Secondary | ICD-10-CM | POA: Insufficient documentation

## 2016-10-30 DIAGNOSIS — Z7901 Long term (current) use of anticoagulants: Secondary | ICD-10-CM | POA: Diagnosis not present

## 2016-10-30 DIAGNOSIS — Z800 Family history of malignant neoplasm of digestive organs: Secondary | ICD-10-CM | POA: Insufficient documentation

## 2016-10-30 DIAGNOSIS — M1990 Unspecified osteoarthritis, unspecified site: Secondary | ICD-10-CM | POA: Diagnosis not present

## 2016-10-30 DIAGNOSIS — Z79899 Other long term (current) drug therapy: Secondary | ICD-10-CM | POA: Diagnosis not present

## 2016-10-30 DIAGNOSIS — I129 Hypertensive chronic kidney disease with stage 1 through stage 4 chronic kidney disease, or unspecified chronic kidney disease: Secondary | ICD-10-CM | POA: Insufficient documentation

## 2016-10-30 DIAGNOSIS — N2581 Secondary hyperparathyroidism of renal origin: Secondary | ICD-10-CM | POA: Diagnosis not present

## 2016-10-30 LAB — HEMOGLOBIN: Hemoglobin: 8.4 g/dL — ABNORMAL LOW (ref 13.0–18.0)

## 2016-10-30 MED ORDER — EPOETIN ALFA 10000 UNIT/ML IJ SOLN
10000.0000 [IU] | Freq: Once | INTRAMUSCULAR | Status: AC
  Administered 2016-10-30: 10000 [IU] via SUBCUTANEOUS
  Filled 2016-10-30: qty 2

## 2016-11-05 ENCOUNTER — Ambulatory Visit: Payer: Medicare Other | Admitting: Hematology and Oncology

## 2016-11-05 ENCOUNTER — Other Ambulatory Visit: Payer: Medicare Other

## 2016-11-08 ENCOUNTER — Encounter: Payer: Self-pay | Admitting: Hematology and Oncology

## 2016-11-10 DIAGNOSIS — H6123 Impacted cerumen, bilateral: Secondary | ICD-10-CM | POA: Diagnosis not present

## 2016-11-10 DIAGNOSIS — H6983 Other specified disorders of Eustachian tube, bilateral: Secondary | ICD-10-CM | POA: Diagnosis not present

## 2016-11-10 DIAGNOSIS — H903 Sensorineural hearing loss, bilateral: Secondary | ICD-10-CM | POA: Diagnosis not present

## 2016-11-11 ENCOUNTER — Other Ambulatory Visit: Payer: Self-pay | Admitting: Family Medicine

## 2016-11-13 ENCOUNTER — Inpatient Hospital Stay: Payer: Medicare Other

## 2016-11-13 ENCOUNTER — Encounter: Payer: Self-pay | Admitting: Hematology and Oncology

## 2016-11-13 ENCOUNTER — Inpatient Hospital Stay (HOSPITAL_BASED_OUTPATIENT_CLINIC_OR_DEPARTMENT_OTHER): Payer: Medicare Other | Admitting: Hematology and Oncology

## 2016-11-13 VITALS — BP 161/67 | HR 69 | Temp 97.7°F | Resp 16 | Ht 67.0 in | Wt 164.0 lb

## 2016-11-13 DIAGNOSIS — Z800 Family history of malignant neoplasm of digestive organs: Secondary | ICD-10-CM | POA: Diagnosis not present

## 2016-11-13 DIAGNOSIS — N184 Chronic kidney disease, stage 4 (severe): Secondary | ICD-10-CM

## 2016-11-13 DIAGNOSIS — Z87891 Personal history of nicotine dependence: Secondary | ICD-10-CM

## 2016-11-13 DIAGNOSIS — Z79899 Other long term (current) drug therapy: Secondary | ICD-10-CM | POA: Diagnosis not present

## 2016-11-13 DIAGNOSIS — Z85820 Personal history of malignant melanoma of skin: Secondary | ICD-10-CM | POA: Diagnosis not present

## 2016-11-13 DIAGNOSIS — D4621 Refractory anemia with excess of blasts 1: Secondary | ICD-10-CM

## 2016-11-13 DIAGNOSIS — Z7901 Long term (current) use of anticoagulants: Secondary | ICD-10-CM | POA: Diagnosis not present

## 2016-11-13 DIAGNOSIS — E785 Hyperlipidemia, unspecified: Secondary | ICD-10-CM | POA: Diagnosis not present

## 2016-11-13 DIAGNOSIS — D696 Thrombocytopenia, unspecified: Secondary | ICD-10-CM

## 2016-11-13 DIAGNOSIS — N400 Enlarged prostate without lower urinary tract symptoms: Secondary | ICD-10-CM

## 2016-11-13 DIAGNOSIS — H409 Unspecified glaucoma: Secondary | ICD-10-CM | POA: Diagnosis not present

## 2016-11-13 DIAGNOSIS — I129 Hypertensive chronic kidney disease with stage 1 through stage 4 chronic kidney disease, or unspecified chronic kidney disease: Secondary | ICD-10-CM

## 2016-11-13 DIAGNOSIS — M1990 Unspecified osteoarthritis, unspecified site: Secondary | ICD-10-CM

## 2016-11-13 DIAGNOSIS — D472 Monoclonal gammopathy: Secondary | ICD-10-CM

## 2016-11-13 DIAGNOSIS — N183 Chronic kidney disease, stage 3 (moderate): Secondary | ICD-10-CM

## 2016-11-13 DIAGNOSIS — D631 Anemia in chronic kidney disease: Secondary | ICD-10-CM

## 2016-11-13 DIAGNOSIS — J449 Chronic obstructive pulmonary disease, unspecified: Secondary | ICD-10-CM | POA: Diagnosis not present

## 2016-11-13 DIAGNOSIS — N2581 Secondary hyperparathyroidism of renal origin: Secondary | ICD-10-CM

## 2016-11-13 LAB — CBC WITH DIFFERENTIAL/PLATELET
Basophils Absolute: 0 10*3/uL (ref 0–0.1)
Basophils Relative: 0 %
Eosinophils Absolute: 0.1 10*3/uL (ref 0–0.7)
Eosinophils Relative: 1 %
HCT: 26.8 % — ABNORMAL LOW (ref 40.0–52.0)
Hemoglobin: 8.9 g/dL — ABNORMAL LOW (ref 13.0–18.0)
Lymphocytes Relative: 13 %
Lymphs Abs: 0.9 10*3/uL — ABNORMAL LOW (ref 1.0–3.6)
MCH: 32 pg (ref 26.0–34.0)
MCHC: 33.3 g/dL (ref 32.0–36.0)
MCV: 96.1 fL (ref 80.0–100.0)
Monocytes Absolute: 0.6 10*3/uL (ref 0.2–1.0)
Monocytes Relative: 9 %
Neutro Abs: 5.1 10*3/uL (ref 1.4–6.5)
Neutrophils Relative %: 77 %
Platelets: 82 10*3/uL — ABNORMAL LOW (ref 150–440)
RBC: 2.79 MIL/uL — ABNORMAL LOW (ref 4.40–5.90)
RDW: 15.3 % — ABNORMAL HIGH (ref 11.5–14.5)
WBC: 6.7 10*3/uL (ref 3.8–10.6)

## 2016-11-13 LAB — RETICULOCYTES
RBC.: 2.92 MIL/uL — ABNORMAL LOW (ref 4.40–5.90)
Retic Count, Absolute: 23.4 10*3/uL (ref 19.0–183.0)
Retic Ct Pct: 0.8 % (ref 0.4–3.1)

## 2016-11-13 LAB — FERRITIN: Ferritin: 31 ng/mL (ref 24–336)

## 2016-11-13 MED ORDER — EPOETIN ALFA 10000 UNIT/ML IJ SOLN
10000.0000 [IU] | Freq: Once | INTRAMUSCULAR | Status: AC
  Administered 2016-11-13: 10000 [IU] via SUBCUTANEOUS
  Filled 2016-11-13: qty 2

## 2016-11-13 NOTE — Progress Notes (Signed)
[facility] day:   11/13/2016   Chief Complaint: Charles Rodgers is an 83 y.o. male with anemia, thrombocytopenia, chronic renal insufficiency, and a monoclonal gammopathy who is seen for 6 week assessment on Procrit.  HPI: The patient was last seen in the medical oncology clinic on 10/09/2016.  At that time, he felt better.  He was gaining weight.  Hematocrit was 25.5 with a hemoglobin of 8.6.  Platelet count was 79,000.  BUN was 72 with a creatinine of 1.88.  Calcium was 8.7.  SPEP revealed an M-spike of 0.2 gm/dL.  Kappa free light chains were 182.3, lambda free light chains 55.5, with a ratio of 3.28 (0.6 - 1.65).  Decision was made to pursue a trial of erythropoietin.  He received erythropoietin 10,000 units on 10/16/2016 and 10/30/2016.  Hemoglobin was 8.4 on 10/30/2016. Patient denies adverse side effects related to the use of this medication.   Symptomatically, patient reports that he is feeling "pretty good". Patient has no physical complaints of anything today.    Past Medical History:  Diagnosis Date  . Arthritis   . BPH (benign prostatic hypertrophy)    with elevated PSA, s/p benign biopsy, followed yearly by [facility] (last seen 01/16/2013)  . CKD (chronic kidney disease) stage 3, GFR 30-59 ml/min    [place] -> 2017 requested PCP start following  . COPD (chronic obstructive pulmonary disease) ([place]) 2016   by xray  . Diabetes mellitus type 2, controlled ([place])   . Glaucoma   . Hearing loss   . HLD (hyperlipidemia)   . HTN (hypertension)   . IgM monoclonal gammopathy of uncertain significance 08/27/2014   07/2014 suggest rpt 6 mo.  . Malignant melanoma of left upper arm ([place]) 11/14/2014   s/p excision complicated by infected axillary seroma (Dr Carmin Muskrat)  . Melanoma ([place]) 2014   nose and left hand  . Secondary hyperparathyroidism ([place])   . Subclavian artery stenosis, right ([place]) 2014   found on carotid US, seen [facility],  recommended recheck 1 yr at vascular lab with Dr. Delana Meyer  . Thyroid nodule 02/2013   right upper lobe - s/p biopsy - benign follicular nodule  . Urine incontinence     Past Surgical History:  Procedure Laterality Date  . APPENDECTOMY  1938  . arm surgery     Right after trauma  . BIOPSY THYROID Right [id number]   benign follicular nodule  . carotid US  07/2007   WNL  . 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 US ECHOCARDIOGRAPHY  06/2007   EF 55%, mod dil LA, mild LVH,mild pulm HTN    Family History  Problem Relation Age of Onset  . Cancer Mother        stomach  . Cancer Brother        colon  . Cancer Brother        colon  . Cancer Sister        hodgkin's lymphoma  . Cancer Brother        prostate  . Diabetes Brother   . Coronary artery disease Neg Hx   . Stroke Neg Hx     Social History:  reports that he quit smoking about 49 years ago. His smoking use included Cigarettes. He has a 7.50 pack-year smoking history. He has never used smokeless tobacco. He reports that he does not drink alcohol or use drugs.  His wife has  Alzheimer's.  His daughter, Loletha Carrow, brings food to the house.  His brother's phone number is:  [phone].  The patient is accompanied by his brother, [name].  Allergies: No Known Allergies  Current Medications: Current Outpatient Prescriptions  Medication Sig Dispense Refill  . amLODipine (NORVASC) 5 MG tablet TAKE 1 TABLET BY MOUTH ONCE DAILY 90 tablet 2  . Blood Glucose Monitoring Suppl (ONE TOUCH ULTRA 2) W/DEVICE KIT     . Casanthranol-Docusate Sodium 30-100 MG CAPS Take by mouth daily.    . Cholecalciferol (VITAMIN D) 2000 units CAPS Take 1 capsule (2,000 Units total) by mouth daily. 30 capsule   . dorzolamide-timolol (COSOPT) 22.3-6.8 MG/ML ophthalmic solution Place 1 drop into both eyes 2 (two) times daily. 10 mL   . doxazosin (CARDURA) 8 MG tablet TAKE 1 TABLET BY MOUTH AT BEDTIME 90 tablet 1  .  ELIQUIS 2.5 MG TABS tablet TAKE 1 TABLET BY MOUTH TWICE (2) DAILY 180 tablet 1  . ferrous sulfate 325 (65 FE) MG tablet TAKE 1 TABLET BY MOUTH ONCE A DAY WITH BREAKFAST. 90 tablet 1  . furosemide (LASIX) 20 MG tablet TAKE 1 TABLET BY MOUTH ONCE A DAY 30 tablet 11  . glucose blood (ONE TOUCH ULTRA TEST) test strip USE TO CHECK BLOOD SUGAR 3 TIMES DAILY Dx: E11.42 300 each 2  . latanoprost (XALATAN) 0.005 % ophthalmic solution Place 1 drop into both eyes at bedtime. 2.5 mL   . lisinopril-hydrochlorothiazide (PRINZIDE,ZESTORETIC) 20-25 MG tablet TAKE 1 TABLET BY MOUTH ONCE A DAY 90 tablet 2  . Multiple Vitamins-Minerals (PRESERVISION AREDS) CAPS Take 2 capsules by mouth daily.    Glory Rosebush DELICA LANCETS MISC Use as directed    . potassium chloride (K-DUR) 10 MEQ tablet TAKE 1 TABLET BY MOUTH DAILY AS NEEDED 30 tablet 3  . pravastatin (PRAVACHOL) 40 MG tablet TAKE 1 TABLET BY MOUTH AT BEDTIME 90 tablet 1   No current facility-administered medications for this visit.     Review of Systems:  GENERAL:  Feels well.  No fevers or sweats .  Weight stable. PERFORMANCE STATUS (ECOG):  1 HEENT:  Hard of hearing.  No visual changes, runny nose, sore throat, mouth sores or tenderness. Lungs: No shortness of breath or cough.  No hemoptysis. Cardiac:  No chest pain, palpitations, orthopnea, or PND. GI:  No nausea, vomiting, diarrhea, constipation, melena or hematochezia. GU:  No urgency, frequency, dysuria, or hematuria. Musculoskeletal:  No back pain.  Arthritis pain.  No muscle tenderness. Extremities:  Edema to bilateral ankles. No pain. Skin:  No rashes or skin changes. Neuro:  No headache, numbness or weakness, balance or coordination issues. Endocrine:  No diabetes, thyroid issues, hot flashes or night sweats. Psych:  No mood changes, depression or anxiety. Pain:  No focal pain. Review of systems:  All other systems reviewed and found to be negative.   Physical Exam:  Blood pressure (!)  161/67, pulse 69, temperature 97.7 F (36.5 C), temperature source Tympanic, resp. rate 16, height _0  (1.702 m), weight 164 lb (74.4 kg). GENERAL:  Well developed, well nourished, elderly gentleman sitting comfortably in the exam room in no acute distress. MENTAL STATUS:  Alert and oriented to person, place and time. HEAD:  Wearing a cap.  White hair.  Male pattern baldness.  Normocephalic, atraumatic, face symmetric, no Cushingoid features. EYES:  Glasses.  Hazel/brown eyes.  Pupils equal round and reactive to light and accomodation.  No conjunctivitis or scleral icterus. ENT:  Oropharynx clear without  lesion.  Tongue normal. Mucous membranes moist.  RESPIRATORY:  Clear to auscultation without rales, wheezes or rhonchi. CARDIOVASCULAR:  Regular rate and rhythm without murmur, rub or gallop. ABDOMEN:  Ventral hernia.  Soft, non-tender, with active bowel sounds, and no hepatosplenomegaly.  No masses. SKIN:  Skin changes due to sun exposure.  Facial irritation due to shaving.  No rashes, ulcers or lesions. EXTREMITIES: Trace ankle edema.  No skin discoloration or tenderness.  No palpable cords. LYMPH NODES: No palpable cervical, supraclavicular, axillary or inguinal adenopathy  NEUROLOGICAL: Unremarkable. PSYCH:  Appropriate.    Appointment on 11/13/2016  Component Date Value Ref Range Status  . WBC 11/13/2016 6.7  3.8 - 10.6 K/uL Final  . RBC 11/13/2016 2.79* 4.40 - 5.90 MIL/uL Final  . Hemoglobin 11/13/2016 8.9* 13.0 - 18.0 g/dL Final  . HCT 11/13/2016 26.8* 40.0 - 52.0 % Final  . MCV 11/13/2016 96.1  80.0 - 100.0 fL Final  . MCH 11/13/2016 32.0  26.0 - 34.0 pg Final  . MCHC 11/13/2016 33.3  32.0 - 36.0 g/dL Final  . RDW 11/13/2016 15.3* 11.5 - 14.5 % Final  . Platelets 11/13/2016 82* 150 - 440 K/uL Final  . Neutrophils Relative % 11/13/2016 77  % Final  . Neutro Abs 11/13/2016 5.1  1.4 - 6.5 K/uL Final  . Lymphocytes Relative 11/13/2016 13  % Final  . Lymphs Abs 11/13/2016 0.9* 1.0  - 3.6 K/uL Final  . Monocytes Relative 11/13/2016 9  % Final  . Monocytes Absolute 11/13/2016 0.6  0.2 - 1.0 K/uL Final  . Eosinophils Relative 11/13/2016 1  % Final  . Eosinophils Absolute 11/13/2016 0.1  0 - 0.7 K/uL Final  . Basophils Relative 11/13/2016 0  % Final  . Basophils Absolute 11/13/2016 0.0  0 - 0.1 K/uL Final  . Retic Ct Pct 11/13/2016 0.8  0.4 - 3.1 % Final  . RBC. 11/13/2016 2.92* 4.40 - 5.90 MIL/uL Final  . Retic Count, Absolute 11/13/2016 23.4  19.0 - 183.0 K/uL Final    Assessment:  Said Rueb is an 83 y.o. male with mild cytopenias likely multi-factorial in nature (renal insufficiency and anemia of chronic disease).  He has a 2 year history of a normocytic anemia, thrombocytopenia and a normal white count.    He has a monoclonal gammopathy of unknown significance (MGUS).   Immunoglobulin levels were normal.  24 hour UPEP revealed no monoclonal protein on 09/14/2014  SPEP has been followed: 0.3 gm/dL on 08/15/2014, 0.4 gm.dL on 09/11/2014, 0.3 gm/dL on 05/07/2016, and 0.2 gm/dL on 10/09/2016.   Kappa free light chains have been followed:  147.5 (ratio 4.56) on 09/11/2014, 153.6 (ratio 3.35) on 05/07/2016, and 182 (ratio 3.28) on 10/09/2016.  Bone survey on 10/03/2014 revealed subtle lucencies within the calvarium, left femoral head, and proximal left tibia which could reflect myelomatous involvement.  Bone survey on 10/03/2015 revealed no definite radiolucent lesions. There was diffuse osteopenia.  There were multiple old healed right lateral rib fractures.  Bone marrow aspirate and biopsy on 10/15/2015 revealed no significant dysplasia or abnormal or overt neoplasia. Marrow was slightly hypercellular for age (40%) with maturing trilineage hematopoiesis, relatively diminished erythroid precursors, and adequate megakaryocytes. There was marginally increased polytypic plasma cells (5%). There was diffuse mild increase in reticulin. There were trace iron stores.  Flow  cytometry revealed a low level monoclonal B population (1%) with nonspecific immunophenotype.  Cytogenetics were normal (46, XY) with a normal SNP microarray.   He has chronic renal insufficiency (  Cr 1.7 - 2.03) over the past 2 years without trend.  Creatinine was 1.90 (CrCl 24.6 ml/min) on 09/16/2016.  He denies any new medications or herbal products.  Ferritin was 48 on 08/06/2016.  Peripheral smear on 08/27/2014 revealed no abnormal white blood cells, thrombocytopenia without clumping and some large platelets. There were elliptocytes, teardrop red blood cells, and schistocytes. Teardrop RBCs and elliptocytes suggest possible extramedullary hematopoiesis (myelofibrosis).  Myelodysplastic syndrome with deletion 20q was a consideration.  Work-up on 09/11/2014 included CBC with a hematocrit 30.4, hemoglobin 9.7, MCV 95.1, platelets 80,000, white count 5300 with an Stebbins of 3800. Differential was unremarkable.  B12, folate, TSH, ANA, PT, PTT, LDH.  Retic was 0.7%. Ferritin was 47 with a iron saturation of 20% and a TIBC of 265 (normal).  B12 and folate were normal on 08/17/2016.  Retic was 0.8% on 08/17/2016.  Epo level was 12.9 on 08/17/2016.   He began Procrit every 2 weeks on 10/16/2016.  Diet has been poor until recently.  He is on ferrous sulfate.  He denies any melena or hematochezia.  He has never had a colonoscopy.  Guaiac cards were negative 2 on 09/16/2014.   Symptomatically, he is feeling well. Patient has no physical complaints today.  His weight is stable..  Exam reveals no adenopathy or hepatosplenomegaly.  Hematocrit is 26.8 with a hemoglobin of 8.9.  Platelet count is 82,000.    Plan: 1.  Labs today:  CBC with diff, ferritin, retic. 2.  Discuss trend in hemoglobin.  Hemoglobin has increased from 8.6 to 8.9 over the past month.  Discuss continuation at the current dose.  Discuss plan for bone marrow if counts problematic. 3.  Procrit 10,000 units today. 4.  RTC every 2 weeks starting  next week for Hgb +/- Procrit. 5.  RTC in 8 weeks (coordinate with above) for MD assessment, labs (CBC with diff, ferritin), and +/- Procrit.   Melissa C. Mike Gip, MD  11/13/2016 , 11:26 AM

## 2016-11-13 NOTE — Progress Notes (Signed)
BP 161/67 Ok to proceed with procrit per NP and MD

## 2016-11-18 ENCOUNTER — Other Ambulatory Visit: Payer: Self-pay | Admitting: Family Medicine

## 2016-11-18 ENCOUNTER — Telehealth: Payer: Self-pay

## 2016-11-18 NOTE — Telephone Encounter (Signed)
Charles Rodgers said he had previously discussed with Dr Darnell Level about when he was in the donut hole he would not be able to afford the Eliquis; now the Eliquis is $157.60/month. Pt wants to know what to do to be less expensive. Pt request cb on 11/19/16. I looked on long hallway and could not find any Eliquis coupons or discounts.

## 2016-11-19 NOTE — Telephone Encounter (Signed)
Lm on pts vm requesting a call back 

## 2016-11-19 NOTE — Telephone Encounter (Signed)
Would recommend he come in for coumadin transition to establish with our coumadin clinic while he gets out of the donut hole. plz schedule with coumadin clinic. Continue eliquis until then.

## 2016-11-20 ENCOUNTER — Inpatient Hospital Stay: Payer: Medicare Other

## 2016-11-20 VITALS — BP 119/59 | HR 54 | Resp 16

## 2016-11-20 DIAGNOSIS — D631 Anemia in chronic kidney disease: Secondary | ICD-10-CM

## 2016-11-20 DIAGNOSIS — N184 Chronic kidney disease, stage 4 (severe): Principal | ICD-10-CM

## 2016-11-20 DIAGNOSIS — Z800 Family history of malignant neoplasm of digestive organs: Secondary | ICD-10-CM | POA: Diagnosis not present

## 2016-11-20 DIAGNOSIS — I129 Hypertensive chronic kidney disease with stage 1 through stage 4 chronic kidney disease, or unspecified chronic kidney disease: Secondary | ICD-10-CM | POA: Diagnosis not present

## 2016-11-20 DIAGNOSIS — D472 Monoclonal gammopathy: Secondary | ICD-10-CM | POA: Diagnosis not present

## 2016-11-20 DIAGNOSIS — N183 Chronic kidney disease, stage 3 (moderate): Secondary | ICD-10-CM | POA: Diagnosis not present

## 2016-11-20 DIAGNOSIS — D696 Thrombocytopenia, unspecified: Secondary | ICD-10-CM | POA: Diagnosis not present

## 2016-11-20 DIAGNOSIS — M1990 Unspecified osteoarthritis, unspecified site: Secondary | ICD-10-CM | POA: Diagnosis not present

## 2016-11-20 DIAGNOSIS — N2581 Secondary hyperparathyroidism of renal origin: Secondary | ICD-10-CM | POA: Diagnosis not present

## 2016-11-20 DIAGNOSIS — N400 Enlarged prostate without lower urinary tract symptoms: Secondary | ICD-10-CM | POA: Diagnosis not present

## 2016-11-20 DIAGNOSIS — H409 Unspecified glaucoma: Secondary | ICD-10-CM | POA: Diagnosis not present

## 2016-11-20 DIAGNOSIS — D4621 Refractory anemia with excess of blasts 1: Secondary | ICD-10-CM | POA: Diagnosis not present

## 2016-11-20 DIAGNOSIS — E785 Hyperlipidemia, unspecified: Secondary | ICD-10-CM | POA: Diagnosis not present

## 2016-11-20 DIAGNOSIS — J449 Chronic obstructive pulmonary disease, unspecified: Secondary | ICD-10-CM | POA: Diagnosis not present

## 2016-11-20 DIAGNOSIS — Z7901 Long term (current) use of anticoagulants: Secondary | ICD-10-CM | POA: Diagnosis not present

## 2016-11-20 DIAGNOSIS — Z79899 Other long term (current) drug therapy: Secondary | ICD-10-CM | POA: Diagnosis not present

## 2016-11-20 DIAGNOSIS — Z87891 Personal history of nicotine dependence: Secondary | ICD-10-CM | POA: Diagnosis not present

## 2016-11-20 DIAGNOSIS — Z85820 Personal history of malignant melanoma of skin: Secondary | ICD-10-CM | POA: Diagnosis not present

## 2016-11-20 LAB — HEMOGLOBIN: Hemoglobin: 8.8 g/dL — ABNORMAL LOW (ref 13.0–18.0)

## 2016-11-20 MED ORDER — EPOETIN ALFA 10000 UNIT/ML IJ SOLN
10000.0000 [IU] | Freq: Once | INTRAMUSCULAR | Status: AC
  Administered 2016-11-20: 10000 [IU] via SUBCUTANEOUS
  Filled 2016-11-20: qty 2

## 2016-11-20 NOTE — Telephone Encounter (Signed)
I will contact patient on Monday (11/23/16) to set up care.  Thanks.

## 2016-11-23 MED ORDER — WARFARIN SODIUM 5 MG PO TABS: 5.0000 mg | ORAL_TABLET | Freq: Every day | ORAL | 1 refills | Status: DC

## 2016-11-23 MED ORDER — APIXABAN 2.5 MG PO TABS: 2.5000 mg | ORAL_TABLET | Freq: Two times a day (BID) | ORAL | 0 refills | Status: DC

## 2016-11-23 NOTE — Telephone Encounter (Signed)
Spoke with patient, he is VERY hard of hearing and could not understand directions over the phone.  His wife has cognitive impairment and was unable to assist with conversation.  Patient does state that he has 2 days of Eliquis left.  I called in #2 pills to carry him for a total of 3 days and called in his warfarin 5mg  tablets.  He is NOT to take any of the warfarin medication and should bring all of his pills with him to coumadin clinic appointment tomorrow.    I plan to review a written plan with patient for the next 3 days to ensure competency and understanding of management plan.  Patient verbalizes understanding.  Also, spoke with pharmacist at [facility] who is very familiar with patient.  Pharmacist will reinforce plan and appointment with clinic tomorrow.

## 2016-11-23 NOTE — Telephone Encounter (Signed)
Pt has 3 Eliquis left and pt wants to know what to do. Leafy Ro will call pt back later today. Pt voiced understanding.

## 2016-11-24 ENCOUNTER — Ambulatory Visit (INDEPENDENT_AMBULATORY_CARE_PROVIDER_SITE_OTHER): Payer: Medicare Other

## 2016-11-24 DIAGNOSIS — Z5181 Encounter for therapeutic drug level monitoring: Secondary | ICD-10-CM

## 2016-11-24 DIAGNOSIS — I4891 Unspecified atrial fibrillation: Secondary | ICD-10-CM | POA: Diagnosis not present

## 2016-11-24 LAB — POCT INR: INR: 0

## 2016-11-24 NOTE — Patient Instructions (Addendum)
Pre visit review using our clinic review tool, if applicable. No additional management support is needed unless otherwise documented below in the visit note.   INR - * not checked today as this is new patient visit to coumadin clinic.  Patient will start coumadin tonight.*  Patient is very hard of hearing and vision is limited as well.  I was unsuccessful in giving him directions over the phone and found it best to have patient come in today for a face to face visit to review coumadin protocol and dosing schedule.  A written calendar with dosing plan and next appointment information given and reviewed with patient.  Spent approx. 35 - 65minutes with patient reviewing dosing instructions for this next week.  He was not able to verbalize back to me complete understanding of instructions.  He understood today and tomorrow but beyond that was not clear.  Patient did demonstrate that he could read the calendar given and was told to follow those instructions.  In addition, he was given my direct office line number to call if he became confused over any directions.    Patient is to take Eliquis twice daily today AND 1 pill (5mg ) warfarin in the evening for next 3 days while finishing his Eliquis (8/28, 8/29, 8/30).  On Friday, he will complete his Eliquis therapy and start taking 1 pill (5mg ) warfarin every evening until his recheck in the coumadin clinic at 1:15pm on 12/01/16.  Patient educated on risks associated with coumadin therapy and to inform us immediately if any abnormal bruising or bleeding or, if severe and uncontrollable, to go straight to the ER.  I did encourage him to use a pill box to help him remember how and when to take his medication.  He is thinking about it but no agreement made today to start using one.  Again, it is unclear at this point how much of this information patient was able to process and understand.  Dr. Danise Mina made aware of concerns and we will monitor him carefully to determine  if he can be managed safely on coumadin therapy moving forward.

## 2016-11-24 NOTE — Telephone Encounter (Signed)
Patient came in to coumadin clinic today to review new start plan.  (Refer to coag encounter note for more detail).  Patient did not remember to bring his pill bottles as requested.  He is hard of hearing with some noted visual and cognitive impairments (likely due to hearing/visual loss).    I am concerned for safety moving forward with coumadin transition and have shared these concerns with Dr. Danise Mina.  Written plan given and reviewed with patient today.  We will monitor him carefully and determine whether over the next week or 2 we will be able to safely manage him.

## 2016-12-01 ENCOUNTER — Ambulatory Visit (INDEPENDENT_AMBULATORY_CARE_PROVIDER_SITE_OTHER): Payer: Medicare Other

## 2016-12-01 ENCOUNTER — Telehealth: Payer: Self-pay

## 2016-12-01 DIAGNOSIS — Z5181 Encounter for therapeutic drug level monitoring: Secondary | ICD-10-CM | POA: Diagnosis not present

## 2016-12-01 DIAGNOSIS — I4891 Unspecified atrial fibrillation: Secondary | ICD-10-CM

## 2016-12-01 LAB — POCT INR: INR: 6.5

## 2016-12-01 NOTE — Patient Instructions (Signed)
Pre visit review using our clinic review tool, if applicable. No additional management support is needed unless otherwise documented below in the visit note.   INR today 6.5  I am very concerned with patient's ability to safely manage his coumadin. Patient was confused over appointment time today despite written instructions and call reminder.  He was able to verbalize that he took 1pill daily of "that new medication" I gave him and completed the "other" medication last week.  He is very hard of hearing and visual acuity is limited.  I gave him large print written instructions today to HOLD his warfarin until he sees me on Friday. He struggled to read printed material back to me and could only converse with me after helping him turn up his hearing aide and I still needed to "yell" for him to put together part of what I was saying. I will continue to keep PCP in the loop but I feel like Eliquis may be the safer option for him as I am not sure he will be able to handle dosage changes or adjustments that are often necessary when taking coumadin.    I educated patient on risks associated with his supratherapeutic level.  He did verbalize understanding of increased risk for bleeding and that he is to go to ER if he cannot get his bleeding to stop.  Patient mentions that he may want to go back on the other medication and try to figure out the cost.  He recognizes not feeling confident in instructions or understanding of the "new medication".

## 2016-12-01 NOTE — Telephone Encounter (Signed)
FYI for Dr. Danise Mina,  This is a copy from my coag visit with patient today:   INR today 6.5  I am very concerned with patient's ability to safely manage his coumadin. Patient was confused over appointment time today despite written instructions and call reminder.  He was able to verbalize that he took 1pill daily of "that new medication" I gave him and completed the "other" medication last week.  He is very hard of hearing and visual acuity is limited.  I gave him large print written instructions today to HOLD his warfarin until he sees me on Friday. He struggled to read printed material back to me and could only converse with me after helping him turn up his hearing aide and I still needed to "yell" for him to put together part of what I was saying. I will continue to keep PCP in the loop but I feel like Eliquis may be the safer option for him as I am not sure he will be able to handle dosage changes or adjustments that are often necessary when taking coumadin.    I educated patient on risks associated with his supratherapeutic level.  He did verbalize understanding of increased risk for bleeding and that he is to go to ER if he cannot get his bleeding to stop.  Patient mentions that he may want to go back on the other medication and try to figure out the cost.  He recognizes not feeling confident in instructions or understanding of the "new medication".

## 2016-12-04 ENCOUNTER — Other Ambulatory Visit: Payer: Self-pay | Admitting: Hematology and Oncology

## 2016-12-04 ENCOUNTER — Inpatient Hospital Stay: Payer: Medicare Other | Attending: Hematology and Oncology

## 2016-12-04 ENCOUNTER — Ambulatory Visit (INDEPENDENT_AMBULATORY_CARE_PROVIDER_SITE_OTHER): Payer: Medicare Other

## 2016-12-04 ENCOUNTER — Inpatient Hospital Stay: Payer: Medicare Other

## 2016-12-04 VITALS — BP 132/67 | HR 69 | Temp 97.0°F | Resp 18

## 2016-12-04 DIAGNOSIS — N183 Chronic kidney disease, stage 3 (moderate): Secondary | ICD-10-CM | POA: Insufficient documentation

## 2016-12-04 DIAGNOSIS — I129 Hypertensive chronic kidney disease with stage 1 through stage 4 chronic kidney disease, or unspecified chronic kidney disease: Secondary | ICD-10-CM | POA: Insufficient documentation

## 2016-12-04 DIAGNOSIS — D696 Thrombocytopenia, unspecified: Secondary | ICD-10-CM

## 2016-12-04 DIAGNOSIS — D631 Anemia in chronic kidney disease: Secondary | ICD-10-CM

## 2016-12-04 DIAGNOSIS — Z5181 Encounter for therapeutic drug level monitoring: Secondary | ICD-10-CM | POA: Diagnosis not present

## 2016-12-04 DIAGNOSIS — I4891 Unspecified atrial fibrillation: Secondary | ICD-10-CM

## 2016-12-04 DIAGNOSIS — D472 Monoclonal gammopathy: Secondary | ICD-10-CM | POA: Diagnosis not present

## 2016-12-04 DIAGNOSIS — N184 Chronic kidney disease, stage 4 (severe): Principal | ICD-10-CM

## 2016-12-04 DIAGNOSIS — E1122 Type 2 diabetes mellitus with diabetic chronic kidney disease: Secondary | ICD-10-CM | POA: Diagnosis not present

## 2016-12-04 LAB — POCT INR: INR: 1.7

## 2016-12-04 LAB — HEMOGLOBIN: Hemoglobin: 8.9 g/dL — ABNORMAL LOW (ref 13.0–18.0)

## 2016-12-04 MED ORDER — EPOETIN ALFA 10000 UNIT/ML IJ SOLN
10000.0000 [IU] | Freq: Once | INTRAMUSCULAR | Status: AC
  Administered 2016-12-04: 10000 [IU] via SUBCUTANEOUS
  Filled 2016-12-04: qty 2

## 2016-12-04 NOTE — Patient Instructions (Signed)
Pre visit review using our clinic review tool, if applicable. No additional management support is needed unless otherwise documented below in the visit note. 

## 2016-12-08 ENCOUNTER — Ambulatory Visit (INDEPENDENT_AMBULATORY_CARE_PROVIDER_SITE_OTHER): Payer: Medicare Other

## 2016-12-08 ENCOUNTER — Telehealth: Payer: Self-pay

## 2016-12-08 DIAGNOSIS — I4891 Unspecified atrial fibrillation: Secondary | ICD-10-CM

## 2016-12-08 DIAGNOSIS — Z5181 Encounter for therapeutic drug level monitoring: Secondary | ICD-10-CM

## 2016-12-08 LAB — POCT INR: INR: 2.2

## 2016-12-08 NOTE — Telephone Encounter (Signed)
Patient called on 12/07/16 and lm on my voicemail while I was out of the office.  He has paperwork ready for [place] to turn in for Eliquis patient assistance program and wants to come by and pick up.

## 2016-12-08 NOTE — Telephone Encounter (Signed)
LM on patient's answering machine to call me and discuss coming in today for coumadin clinic appoitnment at 3:45.  He currently has appointment on 9/14; however, with concerns for upcoming weather conditions, it would be best to go ahead and recheck INR today.  Also, I can take his paperwork and begin processing the Eliquis assistance request.  Patient is to return my call and confirm if he can make it today.

## 2016-12-08 NOTE — Patient Instructions (Signed)
Pre visit review using our clinic review tool, if applicable. No additional management support is needed unless otherwise documented below in the visit note. 

## 2016-12-11 ENCOUNTER — Ambulatory Visit: Payer: Medicare Other

## 2016-12-14 ENCOUNTER — Ambulatory Visit (INDEPENDENT_AMBULATORY_CARE_PROVIDER_SITE_OTHER): Payer: Medicare Other

## 2016-12-14 DIAGNOSIS — Z7901 Long term (current) use of anticoagulants: Secondary | ICD-10-CM | POA: Diagnosis not present

## 2016-12-14 DIAGNOSIS — Z5181 Encounter for therapeutic drug level monitoring: Secondary | ICD-10-CM

## 2016-12-14 DIAGNOSIS — I4891 Unspecified atrial fibrillation: Secondary | ICD-10-CM

## 2016-12-14 LAB — POCT INR: INR: 2.6

## 2016-12-14 NOTE — Patient Instructions (Signed)
Pre visit review using our clinic review tool, if applicable. No additional management support is needed unless otherwise documented below in the visit note. 

## 2016-12-15 DIAGNOSIS — C4362 Malignant melanoma of left upper limb, including shoulder: Secondary | ICD-10-CM | POA: Diagnosis not present

## 2016-12-18 ENCOUNTER — Inpatient Hospital Stay: Payer: Medicare Other

## 2016-12-18 VITALS — BP 152/69

## 2016-12-18 DIAGNOSIS — N184 Chronic kidney disease, stage 4 (severe): Principal | ICD-10-CM

## 2016-12-18 DIAGNOSIS — E1122 Type 2 diabetes mellitus with diabetic chronic kidney disease: Secondary | ICD-10-CM | POA: Diagnosis not present

## 2016-12-18 DIAGNOSIS — D631 Anemia in chronic kidney disease: Secondary | ICD-10-CM

## 2016-12-18 DIAGNOSIS — I129 Hypertensive chronic kidney disease with stage 1 through stage 4 chronic kidney disease, or unspecified chronic kidney disease: Secondary | ICD-10-CM | POA: Diagnosis not present

## 2016-12-18 DIAGNOSIS — N183 Chronic kidney disease, stage 3 (moderate): Secondary | ICD-10-CM | POA: Diagnosis not present

## 2016-12-18 DIAGNOSIS — D696 Thrombocytopenia, unspecified: Secondary | ICD-10-CM

## 2016-12-18 DIAGNOSIS — D472 Monoclonal gammopathy: Secondary | ICD-10-CM | POA: Diagnosis not present

## 2016-12-18 LAB — HEMOGLOBIN: Hemoglobin: 9.3 g/dL — ABNORMAL LOW (ref 13.0–18.0)

## 2016-12-18 MED ORDER — EPOETIN ALFA 10000 UNIT/ML IJ SOLN
10000.0000 [IU] | Freq: Once | INTRAMUSCULAR | Status: AC
  Administered 2016-12-18: 10000 [IU] via SUBCUTANEOUS
  Filled 2016-12-18: qty 2

## 2016-12-22 ENCOUNTER — Ambulatory Visit (INDEPENDENT_AMBULATORY_CARE_PROVIDER_SITE_OTHER): Payer: Medicare Other

## 2016-12-22 DIAGNOSIS — Z23 Encounter for immunization: Secondary | ICD-10-CM | POA: Diagnosis not present

## 2016-12-22 DIAGNOSIS — I4891 Unspecified atrial fibrillation: Secondary | ICD-10-CM

## 2016-12-22 DIAGNOSIS — Z5181 Encounter for therapeutic drug level monitoring: Secondary | ICD-10-CM

## 2016-12-22 LAB — POCT INR: INR: 2.6

## 2016-12-22 NOTE — Patient Instructions (Signed)
Pre visit review using our clinic review tool, if applicable. No additional management support is needed unless otherwise documented below in the visit note. 

## 2016-12-29 ENCOUNTER — Other Ambulatory Visit: Payer: Self-pay

## 2016-12-29 NOTE — Telephone Encounter (Signed)
Patient needs a refill on his coumadin to carry him until decision is made on his Eliquis patient assistance.    In review of patients medications, he has 1 refill already on his warfarin.  Patient made aware to go ahead and get refill if we haven't heard back on patient assistance.

## 2017-01-01 ENCOUNTER — Ambulatory Visit (INDEPENDENT_AMBULATORY_CARE_PROVIDER_SITE_OTHER): Payer: Medicare Other

## 2017-01-01 DIAGNOSIS — I4891 Unspecified atrial fibrillation: Secondary | ICD-10-CM | POA: Diagnosis not present

## 2017-01-01 DIAGNOSIS — Z5181 Encounter for therapeutic drug level monitoring: Secondary | ICD-10-CM | POA: Diagnosis not present

## 2017-01-01 LAB — POCT INR: INR: 1.9

## 2017-01-01 NOTE — Patient Instructions (Signed)
Pre visit review using our clinic review tool, if applicable. No additional management support is needed unless otherwise documented below in the visit note. 

## 2017-01-08 ENCOUNTER — Inpatient Hospital Stay: Payer: Medicare Other

## 2017-01-08 ENCOUNTER — Other Ambulatory Visit: Payer: Self-pay | Admitting: *Deleted

## 2017-01-08 ENCOUNTER — Inpatient Hospital Stay: Payer: Medicare Other | Attending: Hematology and Oncology | Admitting: Hematology and Oncology

## 2017-01-08 ENCOUNTER — Encounter: Payer: Self-pay | Admitting: Hematology and Oncology

## 2017-01-08 ENCOUNTER — Telehealth: Payer: Self-pay

## 2017-01-08 VITALS — BP 159/80 | HR 69 | Temp 97.8°F | Resp 12 | Ht 67.0 in | Wt 164.9 lb

## 2017-01-08 DIAGNOSIS — Z79899 Other long term (current) drug therapy: Secondary | ICD-10-CM | POA: Insufficient documentation

## 2017-01-08 DIAGNOSIS — D696 Thrombocytopenia, unspecified: Secondary | ICD-10-CM

## 2017-01-08 DIAGNOSIS — Z7901 Long term (current) use of anticoagulants: Secondary | ICD-10-CM | POA: Insufficient documentation

## 2017-01-08 DIAGNOSIS — N183 Chronic kidney disease, stage 3 (moderate): Secondary | ICD-10-CM | POA: Insufficient documentation

## 2017-01-08 DIAGNOSIS — D631 Anemia in chronic kidney disease: Secondary | ICD-10-CM | POA: Diagnosis not present

## 2017-01-08 DIAGNOSIS — Z85820 Personal history of malignant melanoma of skin: Secondary | ICD-10-CM | POA: Insufficient documentation

## 2017-01-08 DIAGNOSIS — I129 Hypertensive chronic kidney disease with stage 1 through stage 4 chronic kidney disease, or unspecified chronic kidney disease: Secondary | ICD-10-CM

## 2017-01-08 DIAGNOSIS — E785 Hyperlipidemia, unspecified: Secondary | ICD-10-CM | POA: Diagnosis not present

## 2017-01-08 DIAGNOSIS — Z87891 Personal history of nicotine dependence: Secondary | ICD-10-CM | POA: Insufficient documentation

## 2017-01-08 DIAGNOSIS — N2581 Secondary hyperparathyroidism of renal origin: Secondary | ICD-10-CM

## 2017-01-08 DIAGNOSIS — E1122 Type 2 diabetes mellitus with diabetic chronic kidney disease: Secondary | ICD-10-CM | POA: Diagnosis not present

## 2017-01-08 DIAGNOSIS — N184 Chronic kidney disease, stage 4 (severe): Principal | ICD-10-CM

## 2017-01-08 DIAGNOSIS — M1990 Unspecified osteoarthritis, unspecified site: Secondary | ICD-10-CM | POA: Diagnosis not present

## 2017-01-08 DIAGNOSIS — H9190 Unspecified hearing loss, unspecified ear: Secondary | ICD-10-CM | POA: Insufficient documentation

## 2017-01-08 DIAGNOSIS — J449 Chronic obstructive pulmonary disease, unspecified: Secondary | ICD-10-CM

## 2017-01-08 DIAGNOSIS — N400 Enlarged prostate without lower urinary tract symptoms: Secondary | ICD-10-CM | POA: Diagnosis not present

## 2017-01-08 DIAGNOSIS — H409 Unspecified glaucoma: Secondary | ICD-10-CM | POA: Insufficient documentation

## 2017-01-08 DIAGNOSIS — D508 Other iron deficiency anemias: Secondary | ICD-10-CM

## 2017-01-08 DIAGNOSIS — D472 Monoclonal gammopathy: Secondary | ICD-10-CM | POA: Insufficient documentation

## 2017-01-08 DIAGNOSIS — Z880 Allergy status to penicillin: Secondary | ICD-10-CM | POA: Insufficient documentation

## 2017-01-08 LAB — CBC WITH DIFFERENTIAL/PLATELET
Basophils Absolute: 0 10*3/uL (ref 0–0.1)
Basophils Relative: 0 %
Eosinophils Absolute: 0.1 10*3/uL (ref 0–0.7)
Eosinophils Relative: 1 %
HCT: 30.2 % — ABNORMAL LOW (ref 40.0–52.0)
Hemoglobin: 9.7 g/dL — ABNORMAL LOW (ref 13.0–18.0)
Lymphocytes Relative: 16 %
Lymphs Abs: 0.9 10*3/uL — ABNORMAL LOW (ref 1.0–3.6)
MCH: 30.5 pg (ref 26.0–34.0)
MCHC: 32.1 g/dL (ref 32.0–36.0)
MCV: 95.1 fL (ref 80.0–100.0)
Monocytes Absolute: 0.4 10*3/uL (ref 0.2–1.0)
Monocytes Relative: 7 %
Neutro Abs: 4.3 10*3/uL (ref 1.4–6.5)
Neutrophils Relative %: 76 %
Platelets: 91 10*3/uL — ABNORMAL LOW (ref 150–440)
RBC: 3.18 MIL/uL — ABNORMAL LOW (ref 4.40–5.90)
RDW: 14.4 % (ref 11.5–14.5)
WBC: 5.7 10*3/uL (ref 3.8–10.6)

## 2017-01-08 LAB — CREATININE, SERUM
Creatinine, Ser: 1.99 mg/dL — ABNORMAL HIGH (ref 0.61–1.24)
GFR calc Af Amer: 32 mL/min — ABNORMAL LOW (ref >60)
GFR calc non Af Amer: 28 mL/min — ABNORMAL LOW (ref >60)

## 2017-01-08 LAB — FERRITIN: Ferritin: 48 ng/mL (ref 24–336)

## 2017-01-08 MED ORDER — EPOETIN ALFA 10000 UNIT/ML IJ SOLN
10000.0000 [IU] | Freq: Once | INTRAMUSCULAR | Status: AC
  Administered 2017-01-08: 10000 [IU] via SUBCUTANEOUS
  Filled 2017-01-08: qty 2

## 2017-01-08 NOTE — Telephone Encounter (Signed)
Charles Rodgers pt assistance program left v/m; requesting social security # to verify ins. I spoke with [facility] and she said it is OK to give Soc Sec # to [employer] pt assistance program. I called Charles Rodgers and spoke with [place] . Gave SS# as requested and Charles Rodgers will continue with application for assistance. Will forward to [facility] since this message was on her v/m. FYI to [place].

## 2017-01-08 NOTE — Progress Notes (Signed)
Patient here for follow up he states no changes since last visit. He is extremely hard of hearing.

## 2017-01-08 NOTE — Progress Notes (Signed)
[facility] day:   01/08/2017   Chief Complaint: Charles Rodgers is an 83 y.o. male with anemia, thrombocytopenia, chronic renal insufficiency, and a monoclonal gammopathy who is seen for 8 week assessment on Procrit.  HPI: The patient was last seen in the medical oncology clinic on 11/13/2016.  At that time, he felt "pretty good".   Exam revealed no adenopathy or hepatosplenomegaly.  Hematocrit was 26.8 with a hemoglobin of 8.9.  Platelet count was 82,000.  He received Procrit.  We discussed consideration of a bone marrow if his counts were problematic.  During the interim, hemoglobin has ranged between 8.8 - 9.3.  He has received Procrit every 2 weeks (last 12/18/2016).  Symptomatically, patient is doing "good". Patient has "old age aches and pains". He denies other physical complaints today. Patient denies any bruising or bleeding. He has no B symptoms. No interval infections. Patient eating well and his weight remains stable. Patient continues on his supplemental iron daily.    Past Medical History:  Diagnosis Date  . Arthritis   . BPH (benign prostatic hypertrophy)    with elevated PSA, s/p benign biopsy, followed yearly by [facility] (last seen 01/16/2013)  . CKD (chronic kidney disease) stage 3, GFR 30-59 ml/min ([place])    [place] -> 2017 requested PCP start following  . COPD (chronic obstructive pulmonary disease) ([place]) 2016   by xray  . Diabetes mellitus type 2, controlled ([place])   . Glaucoma   . Hearing loss   . HLD (hyperlipidemia)   . HTN (hypertension)   . IgM monoclonal gammopathy of uncertain significance 08/27/2014   07/2014 suggest rpt 6 mo.  . Malignant melanoma of left upper arm ([place]) 11/14/2014   s/p excision complicated by infected axillary seroma (Dr Carmin Muskrat)  . Melanoma ([place]) 2014   nose and left hand  . Secondary hyperparathyroidism ([place])   . Subclavian artery stenosis, right ([place]) 2014   found on carotid US,  seen [facility], recommended recheck 1 yr at vascular lab with Dr. Delana Meyer  . Thyroid nodule 02/2013   right upper lobe - s/p biopsy - benign follicular nodule  . Urine incontinence     Past Surgical History:  Procedure Laterality Date  . APPENDECTOMY  1938  . arm surgery     Right after trauma  . BIOPSY THYROID Right [id number]/4536   benign follicular nodule  . carotid US  07/2007   WNL  . 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 US ECHOCARDIOGRAPHY  06/2007   EF 55%, mod dil LA, mild LVH,mild pulm HTN    Family History  Problem Relation Age of Onset  . Cancer Mother        stomach  . Cancer Brother        colon  . Cancer Brother        colon  . Cancer Sister        hodgkin's lymphoma  . Cancer Brother        prostate  . Diabetes Brother   . Coronary artery disease Neg Hx   . Stroke Neg Hx     Social History:  reports that he quit smoking about 49 years ago. His smoking use included Cigarettes. He has a 7.50 pack-year smoking history. He has never used smokeless tobacco. He reports that he does not drink alcohol or use drugs.  His wife has Alzheimer's.  His daughter, Loletha Carrow, brings food  to the house.  His brother's [name]) phone number is:  [phone].  The patient is accompanied by his daughter, Olegario Shearer.  Allergies: No Known Allergies  Current Medications: Current Outpatient Prescriptions  Medication Sig Dispense Refill  . amLODipine (NORVASC) 5 MG tablet TAKE 1 TABLET BY MOUTH ONCE DAILY 90 tablet 2  . apixaban (ELIQUIS) 2.5 MG TABS tablet Take 1 tablet (2.5 mg total) by mouth 2 (two) times daily. 2 tablet 0  . Blood Glucose Monitoring Suppl (ONE TOUCH ULTRA 2) W/DEVICE KIT     . Casanthranol-Docusate Sodium 30-100 MG CAPS Take by mouth daily.    . Cholecalciferol (VITAMIN D) 2000 units CAPS Take 1 capsule (2,000 Units total) by mouth daily. 30 capsule   . dorzolamide-timolol (COSOPT) 22.3-6.8 MG/ML ophthalmic solution Place 1 drop into  both eyes 2 (two) times daily. 10 mL   . doxazosin (CARDURA) 8 MG tablet TAKE 1 TABLET BY MOUTH AT BEDTIME 90 tablet 1  . ferrous sulfate 325 (65 FE) MG tablet TAKE 1 TABLET BY MOUTH ONCE A DAY WITH BREAKFAST. 90 tablet 1  . furosemide (LASIX) 20 MG tablet TAKE 1 TABLET BY MOUTH ONCE A DAY 30 tablet 11  . glucose blood (ONE TOUCH ULTRA TEST) test strip USE TO CHECK BLOOD SUGAR 3 TIMES DAILY Dx: E11.42 300 each 2  . latanoprost (XALATAN) 0.005 % ophthalmic solution Place 1 drop into both eyes at bedtime. 2.5 mL   . lisinopril-hydrochlorothiazide (PRINZIDE,ZESTORETIC) 20-25 MG tablet TAKE 1 TABLET BY MOUTH ONCE A DAY 90 tablet 2  . Multiple Vitamins-Minerals (PRESERVISION AREDS) CAPS Take 2 capsules by mouth daily.    Glory Rosebush DELICA LANCETS MISC Use as directed    . potassium chloride (K-DUR) 10 MEQ tablet TAKE 1 TABLET BY MOUTH DAILY AS NEEDED 30 tablet 3  . pravastatin (PRAVACHOL) 40 MG tablet TAKE 1 TABLET BY MOUTH AT BEDTIME 90 tablet 1  . warfarin (COUMADIN) 5 MG tablet Take 1 tablet (5 mg total) by mouth daily. 30 tablet 1   No current facility-administered medications for this visit.     Review of Systems:  GENERAL:  Feels "good as far as I know".  No fevers or sweats .  Weight stable. PERFORMANCE STATUS (ECOG):  1 HEENT:  Hard of hearing.  No visual changes, runny nose, sore throat, mouth sores or tenderness. Lungs: No shortness of breath or cough.  No hemoptysis. Cardiac:  No chest pain, palpitations, orthopnea, or PND. GI:  No nausea, vomiting, diarrhea, constipation, melena or hematochezia. GU:  No urgency, frequency, dysuria, or hematuria. Musculoskeletal:  No back pain.  Arthritis pain.  No muscle tenderness. Extremities:  Bilateral ankle edema (chonic). No pain. Skin:  No rashes or skin changes. Neuro:  No headache, numbness or weakness, balance or coordination issues. Endocrine:  No diabetes, thyroid issues, hot flashes or night sweats. Psych:  No mood changes, depression  or anxiety. Pain:  No focal pain. Review of systems:  All other systems reviewed and found to be negative.   Physical Exam:  Blood pressure (!) 159/80, pulse 69, temperature 97.8 F (36.6 C), temperature source Tympanic, resp. rate 12, height _0  (1.702 m), weight 164 lb 14.4 oz (74.8 kg), SpO2 100 %. GENERAL:  Well developed, well nourished, elderly gentleman sitting comfortably in the exam room in no acute distress. MENTAL STATUS:  Alert and oriented to person, place and time. HEAD:  Wearing a [place] baseball cap.  White hair.  Male pattern baldness.  Normocephalic, atraumatic, face symmetric,  no Cushingoid features. EYES:  Glasses.  Hazel/brown eyes.  Pupils equal round and reactive to light and accomodation.  No conjunctivitis or scleral icterus. ENT:  Oropharynx clear without lesion.  Tongue normal. Mucous membranes moist.  RESPIRATORY:  Clear to auscultation without rales, wheezes or rhonchi. CARDIOVASCULAR:  Regular rate and rhythm without murmur, rub or gallop. ABDOMEN:  Ventral hernia.  Soft, non-tender, with active bowel sounds, and no hepatosplenomegaly.  No masses. SKIN:  Skin changes due to sun exposure.  Facial irritation due to shaving.  No rashes, ulcers or lesions. EXTREMITIES: Trace ankle edema.  No skin discoloration or tenderness.  No palpable cords. LYMPH NODES: No palpable cervical, supraclavicular, axillary or inguinal adenopathy  NEUROLOGICAL: Unremarkable. PSYCH:  Appropriate.    Appointment on 01/08/2017  Component Date Value Ref Range Status  . WBC 01/08/2017 5.7  3.8 - 10.6 K/uL Final  . RBC 01/08/2017 3.18* 4.40 - 5.90 MIL/uL Final  . Hemoglobin 01/08/2017 9.7* 13.0 - 18.0 g/dL Final  . HCT 01/08/2017 30.2* 40.0 - 52.0 % Final  . MCV 01/08/2017 95.1  80.0 - 100.0 fL Final  . MCH 01/08/2017 30.5  26.0 - 34.0 pg Final  . MCHC 01/08/2017 32.1  32.0 - 36.0 g/dL Final  . RDW 01/08/2017 14.4  11.5 - 14.5 % Final  . Platelets 01/08/2017 91* 150 - 440 K/uL Final  .  Neutrophils Relative % 01/08/2017 76  % Final  . Neutro Abs 01/08/2017 4.3  1.4 - 6.5 K/uL Final  . Lymphocytes Relative 01/08/2017 16  % Final  . Lymphs Abs 01/08/2017 0.9* 1.0 - 3.6 K/uL Final  . Monocytes Relative 01/08/2017 7  % Final  . Monocytes Absolute 01/08/2017 0.4  0.2 - 1.0 K/uL Final  . Eosinophils Relative 01/08/2017 1  % Final  . Eosinophils Absolute 01/08/2017 0.1  0 - 0.7 K/uL Final  . Basophils Relative 01/08/2017 0  % Final  . Basophils Absolute 01/08/2017 0.0  0 - 0.1 K/uL Final  . Creatinine, Ser 01/08/2017 1.99* 0.61 - 1.24 mg/dL Final  . GFR calc non Af Amer 01/08/2017 28* >60 mL/min Final  . GFR calc Af Amer 01/08/2017 32* >60 mL/min Final   Comment: (NOTE) The eGFR has been calculated using the CKD EPI equation. This calculation has not been validated in all clinical situations. eGFR's persistently <60 mL/min signify possible Chronic Kidney Disease.     Assessment:  Charles Rodgers is an 83 y.o. male with mild cytopenias likely multi-factorial in nature (renal insufficiency and anemia of chronic disease).  He has a 2 year history of a normocytic anemia, thrombocytopenia and a normal white count.    He has a monoclonal gammopathy of unknown significance (MGUS).   Immunoglobulin levels were normal.  24 hour UPEP revealed no monoclonal protein on 09/14/2014  SPEP has been followed: 0.3 gm/dL on 08/15/2014, 0.4 gm.dL on 09/11/2014, 0.3 gm/dL on 05/07/2016, and 0.2 gm/dL on 10/09/2016.   Kappa free light chains have been followed:  147.5 (ratio 4.56) on 09/11/2014, 153.6 (ratio 3.35) on 05/07/2016, and 182 (ratio 3.28) on 10/09/2016.  Bone survey on 10/03/2014 revealed subtle lucencies within the calvarium, left femoral head, and proximal left tibia which could reflect myelomatous involvement.  Bone survey on 10/03/2015 revealed no definite radiolucent lesions. There was diffuse osteopenia.  There were multiple old healed right lateral rib fractures.  Bone  marrow aspirate and biopsy on 10/15/2015 revealed no significant dysplasia or abnormal or overt neoplasia. Marrow was slightly hypercellular for age (40%)  with maturing trilineage hematopoiesis, relatively diminished erythroid precursors, and adequate megakaryocytes. There was marginally increased polytypic plasma cells (5%). There was diffuse mild increase in reticulin. There were trace iron stores.  Flow cytometry revealed a low level monoclonal B population (1%) with nonspecific immunophenotype.  Cytogenetics were normal (46, XY) with a normal SNP microarray.   He has chronic renal insufficiency (Cr 1.7 - 2.03) over the past 2 years without trend.  Creatinine was 1.90 (CrCl 24.6 ml/min) on 09/16/2016.  He denies any new medications or herbal products.  Ferritin was 48 on 08/06/2016.  Peripheral smear on 08/27/2014 revealed no abnormal white blood cells, thrombocytopenia without clumping and some large platelets. There were elliptocytes, teardrop red blood cells, and schistocytes. Teardrop RBCs and elliptocytes suggest possible extramedullary hematopoiesis (myelofibrosis).  Myelodysplastic syndrome with deletion 20q was a consideration.  Work-up on 09/11/2014 included CBC with a hematocrit 30.4, hemoglobin 9.7, MCV 95.1, platelets 80,000, white count 5300 with an [place] of 3800. Differential was unremarkable.  B12, folate, TSH, ANA, PT, PTT, LDH.  Retic was 0.7%. Ferritin was 47 with a iron saturation of 20% and a TIBC of 265 (normal).  B12 and folate were normal on 08/17/2016.  Retic was 0.8% on 08/17/2016.  Epo level was 12.9 on 08/17/2016.   He began Procrit every 2 weeks on 10/16/2016 (last 12/18/2016).  Diet has been poor until recently.  He is on ferrous sulfate.  He denies any melena or hematochezia.  He has never had a colonoscopy.  Guaiac cards were negative 2 on 09/16/2014.   Symptomatically, he is feeling well. Patient has no physical complaints today.  His weight is stable.  Exam reveals no  adenopathy or hepatosplenomegaly.  Hematocrit is 30.2 with a hemoglobin of 9.7.  Platelet count is 91,000. Creatinine 1.99.    Plan: 1.  Labs today:  CBC with diff, creatinine, ferritin. 2.  Discuss trend in hemoglobin.  Hemoglobin has increased from 8.9 to 9.7 over the past month.  Discuss continuation at the current dose of Procrit.  Discuss plan for bone marrow if counts problematic. 3.  Discuss elevated blood pressure. Blood pressure is 159/80 in the office today. He is taking his antihypertensives as prescribed. Patient to follow up with his PCP.  4.  Procrit 10,000 units today. 5.  RTC every 2 weeks starting next week for Hgb +/- Procrit. 6.  RTC in 12 weeks (coordinate with above) for MD assessment, labs (CBC with diff, CMP, SPEP, ferritin), and +/- Procrit.   Honor Loh, NP  01/08/2017 , 11:51 AM   I saw and evaluated the patient, participating in the key portions of the service and reviewing pertinent diagnostic studies and records.  I reviewed the nurse practitioner's note and agree with the findings and the plan.  The assessment and plan were discussed with the patient.  A few questions were asked by the patient and answered.   Nolon Stalls, MD 01/08/2017,11:51 AM

## 2017-01-11 NOTE — Telephone Encounter (Signed)
Noted! Thank you

## 2017-01-12 ENCOUNTER — Ambulatory Visit: Payer: Medicare Other

## 2017-01-15 ENCOUNTER — Ambulatory Visit (INDEPENDENT_AMBULATORY_CARE_PROVIDER_SITE_OTHER): Payer: Medicare Other

## 2017-01-15 DIAGNOSIS — Z5181 Encounter for therapeutic drug level monitoring: Secondary | ICD-10-CM

## 2017-01-15 DIAGNOSIS — I4891 Unspecified atrial fibrillation: Secondary | ICD-10-CM

## 2017-01-15 DIAGNOSIS — Z7901 Long term (current) use of anticoagulants: Secondary | ICD-10-CM | POA: Diagnosis not present

## 2017-01-15 LAB — POCT INR: INR: 2.2

## 2017-01-15 NOTE — Patient Instructions (Signed)
Pre visit review using our clinic review tool, if applicable. No additional management support is needed unless otherwise documented below in the visit note. 

## 2017-01-22 ENCOUNTER — Other Ambulatory Visit: Payer: Self-pay | Admitting: Hematology and Oncology

## 2017-01-22 ENCOUNTER — Inpatient Hospital Stay: Payer: Medicare Other

## 2017-01-22 VITALS — BP 148/64 | HR 66

## 2017-01-22 DIAGNOSIS — D631 Anemia in chronic kidney disease: Secondary | ICD-10-CM

## 2017-01-22 DIAGNOSIS — Z79899 Other long term (current) drug therapy: Secondary | ICD-10-CM | POA: Diagnosis not present

## 2017-01-22 DIAGNOSIS — M1990 Unspecified osteoarthritis, unspecified site: Secondary | ICD-10-CM | POA: Diagnosis not present

## 2017-01-22 DIAGNOSIS — E1122 Type 2 diabetes mellitus with diabetic chronic kidney disease: Secondary | ICD-10-CM | POA: Diagnosis not present

## 2017-01-22 DIAGNOSIS — Z7901 Long term (current) use of anticoagulants: Secondary | ICD-10-CM | POA: Diagnosis not present

## 2017-01-22 DIAGNOSIS — D472 Monoclonal gammopathy: Secondary | ICD-10-CM | POA: Diagnosis not present

## 2017-01-22 DIAGNOSIS — J449 Chronic obstructive pulmonary disease, unspecified: Secondary | ICD-10-CM | POA: Diagnosis not present

## 2017-01-22 DIAGNOSIS — N400 Enlarged prostate without lower urinary tract symptoms: Secondary | ICD-10-CM | POA: Diagnosis not present

## 2017-01-22 DIAGNOSIS — Z880 Allergy status to penicillin: Secondary | ICD-10-CM | POA: Diagnosis not present

## 2017-01-22 DIAGNOSIS — I129 Hypertensive chronic kidney disease with stage 1 through stage 4 chronic kidney disease, or unspecified chronic kidney disease: Secondary | ICD-10-CM | POA: Diagnosis not present

## 2017-01-22 DIAGNOSIS — N2581 Secondary hyperparathyroidism of renal origin: Secondary | ICD-10-CM | POA: Diagnosis not present

## 2017-01-22 DIAGNOSIS — D696 Thrombocytopenia, unspecified: Secondary | ICD-10-CM | POA: Diagnosis not present

## 2017-01-22 DIAGNOSIS — E785 Hyperlipidemia, unspecified: Secondary | ICD-10-CM | POA: Diagnosis not present

## 2017-01-22 DIAGNOSIS — Z85820 Personal history of malignant melanoma of skin: Secondary | ICD-10-CM | POA: Diagnosis not present

## 2017-01-22 DIAGNOSIS — N184 Chronic kidney disease, stage 4 (severe): Principal | ICD-10-CM

## 2017-01-22 DIAGNOSIS — D508 Other iron deficiency anemias: Secondary | ICD-10-CM

## 2017-01-22 DIAGNOSIS — Z87891 Personal history of nicotine dependence: Secondary | ICD-10-CM | POA: Diagnosis not present

## 2017-01-22 DIAGNOSIS — H9190 Unspecified hearing loss, unspecified ear: Secondary | ICD-10-CM | POA: Diagnosis not present

## 2017-01-22 DIAGNOSIS — H409 Unspecified glaucoma: Secondary | ICD-10-CM | POA: Diagnosis not present

## 2017-01-22 DIAGNOSIS — N183 Chronic kidney disease, stage 3 (moderate): Secondary | ICD-10-CM | POA: Diagnosis not present

## 2017-01-22 LAB — HEMOGLOBIN: Hemoglobin: 9.3 g/dL — ABNORMAL LOW (ref 13.0–18.0)

## 2017-01-22 MED ORDER — EPOETIN ALFA 10000 UNIT/ML IJ SOLN
10000.0000 [IU] | Freq: Once | INTRAMUSCULAR | Status: AC
  Administered 2017-01-22: 10000 [IU] via SUBCUTANEOUS

## 2017-01-26 ENCOUNTER — Ambulatory Visit (INDEPENDENT_AMBULATORY_CARE_PROVIDER_SITE_OTHER): Payer: Medicare Other

## 2017-01-26 DIAGNOSIS — I4891 Unspecified atrial fibrillation: Secondary | ICD-10-CM | POA: Diagnosis not present

## 2017-01-26 DIAGNOSIS — Z5181 Encounter for therapeutic drug level monitoring: Secondary | ICD-10-CM | POA: Diagnosis not present

## 2017-01-26 LAB — POCT INR: INR: 1.9

## 2017-01-26 NOTE — Patient Instructions (Signed)
Pre visit review using our clinic review tool, if applicable. No additional management support is needed unless otherwise documented below in the visit note. 

## 2017-01-27 ENCOUNTER — Telehealth: Payer: Self-pay | Admitting: Family Medicine

## 2017-01-27 NOTE — Telephone Encounter (Signed)
Pt said he was "supposed to get some pills yesterday and didn't get them"

## 2017-01-27 NOTE — Telephone Encounter (Signed)
Noted. Fwd to Dr. Darnell Level as Juluis Rainier.

## 2017-01-27 NOTE — Telephone Encounter (Signed)
I spoke with patient.  He has confirmed that he received his Eliquis delivery today by mail.  Instructions given to start tonight taking the Eliquis 1 pill twice daily and stop warfarin completely.  Patient was told yesterday at coumadin clinic visit to stop his warfarin and has not had any coumadin since 01/25/17.  Patient was able to verbalize back directions to me and understands.  In addition, he had his sister, Opal Sidles, also to review the instructions to help him remember.  Patient will no longer require coumadin clinic management and does have my number in case he has any further questions.

## 2017-01-27 NOTE — Telephone Encounter (Signed)
Copied from [place] #[id number]. Topic: Quick Communication - See Telephone Encounter >> Jan 27, 2017 10:26 AM Burnis Medin, NT wrote: CRM for notification. See Telephone encounter for:  01/27/17. Pt. Said that he was suppose to get some pills yesterday and didn't get them. Pt. Would like a call back from the nurse.

## 2017-02-05 ENCOUNTER — Inpatient Hospital Stay: Payer: Medicare Other | Attending: Hematology and Oncology

## 2017-02-05 ENCOUNTER — Inpatient Hospital Stay: Payer: Medicare Other

## 2017-02-05 VITALS — BP 153/69 | HR 59

## 2017-02-05 DIAGNOSIS — E1122 Type 2 diabetes mellitus with diabetic chronic kidney disease: Secondary | ICD-10-CM | POA: Diagnosis not present

## 2017-02-05 DIAGNOSIS — N183 Chronic kidney disease, stage 3 (moderate): Secondary | ICD-10-CM | POA: Diagnosis not present

## 2017-02-05 DIAGNOSIS — D696 Thrombocytopenia, unspecified: Secondary | ICD-10-CM | POA: Insufficient documentation

## 2017-02-05 DIAGNOSIS — N184 Chronic kidney disease, stage 4 (severe): Principal | ICD-10-CM

## 2017-02-05 DIAGNOSIS — D472 Monoclonal gammopathy: Secondary | ICD-10-CM | POA: Diagnosis not present

## 2017-02-05 DIAGNOSIS — D508 Other iron deficiency anemias: Secondary | ICD-10-CM

## 2017-02-05 DIAGNOSIS — I129 Hypertensive chronic kidney disease with stage 1 through stage 4 chronic kidney disease, or unspecified chronic kidney disease: Secondary | ICD-10-CM | POA: Diagnosis not present

## 2017-02-05 DIAGNOSIS — D631 Anemia in chronic kidney disease: Secondary | ICD-10-CM | POA: Diagnosis not present

## 2017-02-05 LAB — HEMOGLOBIN: Hemoglobin: 9.1 g/dL — ABNORMAL LOW (ref 13.0–18.0)

## 2017-02-05 MED ORDER — EPOETIN ALFA 10000 UNIT/ML IJ SOLN
10000.0000 [IU] | Freq: Once | INTRAMUSCULAR | Status: AC
  Administered 2017-02-05: 10000 [IU] via SUBCUTANEOUS
  Filled 2017-02-05: qty 2

## 2017-02-10 DIAGNOSIS — H6123 Impacted cerumen, bilateral: Secondary | ICD-10-CM | POA: Diagnosis not present

## 2017-02-10 DIAGNOSIS — H6980 Other specified disorders of Eustachian tube, unspecified ear: Secondary | ICD-10-CM | POA: Diagnosis not present

## 2017-02-19 ENCOUNTER — Inpatient Hospital Stay: Payer: Medicare Other

## 2017-03-05 ENCOUNTER — Inpatient Hospital Stay: Payer: Medicare Other | Attending: Hematology and Oncology

## 2017-03-05 ENCOUNTER — Inpatient Hospital Stay: Payer: Medicare Other

## 2017-03-05 VITALS — BP 154/81 | HR 71

## 2017-03-05 DIAGNOSIS — D472 Monoclonal gammopathy: Secondary | ICD-10-CM | POA: Insufficient documentation

## 2017-03-05 DIAGNOSIS — D508 Other iron deficiency anemias: Secondary | ICD-10-CM

## 2017-03-05 DIAGNOSIS — D696 Thrombocytopenia, unspecified: Secondary | ICD-10-CM | POA: Diagnosis not present

## 2017-03-05 DIAGNOSIS — N184 Chronic kidney disease, stage 4 (severe): Principal | ICD-10-CM

## 2017-03-05 DIAGNOSIS — D631 Anemia in chronic kidney disease: Secondary | ICD-10-CM

## 2017-03-05 LAB — HEMOGLOBIN: Hemoglobin: 9 g/dL — ABNORMAL LOW (ref 13.0–18.0)

## 2017-03-05 MED ORDER — EPOETIN ALFA 10000 UNIT/ML IJ SOLN
10000.0000 [IU] | Freq: Once | INTRAMUSCULAR | Status: AC
  Administered 2017-03-05: 10000 [IU] via SUBCUTANEOUS
  Filled 2017-03-05: qty 2

## 2017-03-19 ENCOUNTER — Inpatient Hospital Stay: Payer: Medicare Other

## 2017-03-19 VITALS — BP 159/74 | HR 69

## 2017-03-19 DIAGNOSIS — N184 Chronic kidney disease, stage 4 (severe): Principal | ICD-10-CM

## 2017-03-19 DIAGNOSIS — D631 Anemia in chronic kidney disease: Secondary | ICD-10-CM

## 2017-03-19 DIAGNOSIS — D696 Thrombocytopenia, unspecified: Secondary | ICD-10-CM | POA: Diagnosis not present

## 2017-03-19 DIAGNOSIS — D472 Monoclonal gammopathy: Secondary | ICD-10-CM | POA: Diagnosis not present

## 2017-03-19 DIAGNOSIS — D508 Other iron deficiency anemias: Secondary | ICD-10-CM

## 2017-03-19 LAB — HEMOGLOBIN: Hemoglobin: 9.1 g/dL — ABNORMAL LOW (ref 13.0–18.0)

## 2017-03-19 MED ORDER — EPOETIN ALFA 10000 UNIT/ML IJ SOLN
10000.0000 [IU] | Freq: Once | INTRAMUSCULAR | Status: AC
  Administered 2017-03-19: 10000 [IU] via SUBCUTANEOUS

## 2017-03-25 ENCOUNTER — Encounter: Payer: Self-pay | Admitting: Family Medicine

## 2017-03-25 ENCOUNTER — Ambulatory Visit: Payer: Medicare Other | Admitting: Family Medicine

## 2017-03-25 VITALS — BP 154/70 | HR 65 | Temp 97.9°F | Wt 163.0 lb

## 2017-03-25 DIAGNOSIS — E1121 Type 2 diabetes mellitus with diabetic nephropathy: Secondary | ICD-10-CM

## 2017-03-25 DIAGNOSIS — N184 Chronic kidney disease, stage 4 (severe): Secondary | ICD-10-CM

## 2017-03-25 DIAGNOSIS — H9193 Unspecified hearing loss, bilateral: Secondary | ICD-10-CM

## 2017-03-25 DIAGNOSIS — D631 Anemia in chronic kidney disease: Secondary | ICD-10-CM

## 2017-03-25 DIAGNOSIS — D472 Monoclonal gammopathy: Secondary | ICD-10-CM

## 2017-03-25 DIAGNOSIS — I10 Essential (primary) hypertension: Secondary | ICD-10-CM

## 2017-03-25 DIAGNOSIS — N183 Chronic kidney disease, stage 3 (moderate): Secondary | ICD-10-CM

## 2017-03-25 DIAGNOSIS — E1122 Type 2 diabetes mellitus with diabetic chronic kidney disease: Secondary | ICD-10-CM

## 2017-03-25 DIAGNOSIS — I4891 Unspecified atrial fibrillation: Secondary | ICD-10-CM | POA: Diagnosis not present

## 2017-03-25 DIAGNOSIS — R600 Localized edema: Secondary | ICD-10-CM

## 2017-03-25 LAB — RENAL FUNCTION PANEL
Albumin: 3.8 g/dL (ref 3.5–5.2)
BUN: 57 mg/dL — AB (ref 6–23)
CO2: 23 mEq/L (ref 19–32)
CREATININE: 1.88 mg/dL — AB (ref 0.40–1.50)
Calcium: 8.8 mg/dL (ref 8.4–10.5)
Chloride: 109 mEq/L (ref 96–112)
GFR: 35.98 mL/min — AB (ref >60.00)
GLUCOSE: 107 mg/dL — AB (ref 70–99)
PHOSPHORUS: 4.1 mg/dL (ref 2.3–4.6)
Potassium: 4.1 mEq/L (ref 3.5–5.1)
SODIUM: 138 meq/L (ref 135–145)

## 2017-03-25 LAB — POCT GLUCOSE (DEVICE FOR HOME USE): POC GLUCOSE: 150 mg/dL — AB (ref 70–99)

## 2017-03-25 LAB — HEMOGLOBIN A1C: HEMOGLOBIN A1C: 6.3 % (ref 4.6–6.5)

## 2017-03-25 NOTE — Assessment & Plan Note (Addendum)
Irregular on exam today. Update yearly EKG next visit.  Continue eliquis which he seems to be tolerating well.

## 2017-03-25 NOTE — Progress Notes (Signed)
BP (!) 154/70 (BP Location: Right Arm, Cuff Size: Normal)   Pulse 65   Temp 97.9 F (36.6 C) (Oral)   Wt 163 lb (73.9 kg)   SpO2 99%   BMI 25.53 kg/m    CC: 6 mo f/u visit Subjective:    Patient ID: Charles Rodgers, male    DOB: 01-05-1925, 83 y.o.   MRN: [id number]  HPI: Charles Rodgers is a 83 y.o. male presenting on 03/25/2017 for 6 mo follow-up   Having trouble with his hearing aide - has upcoming appt Jan 2nd. Continued marked hearing loss - despite this continues driving.  Walks with cane.  Has eye doctor appt 04/19/2017.   Afib - Eliquis replaced coumadin 12/2016 - with medication assistance. Pt appreciative of Mandy's help with this process.  Blood cytopenias thought multifactorial as well as MGUS followed by heme/onc. Started on procrit 09/2016 - Q2 wks. Hgb trending 9s.   H/o skin melanoma stage IIA s/p wide excision L arm and SLN biopsy 10/2014 followed by derm.  HTN - compliant with amlodipine 63m daily, cardura 872mdaily, lasix 2044maily, lisinopril hctz 20/29m73mily. Denies significant pedal edema. No HA, vision changes, CP/tightness, SOB.   Relevant past medical, surgical, family and social history reviewed and updated as indicated. Interim medical history since our last visit reviewed. Allergies and medications reviewed and updated. Outpatient Medications Prior to Visit  Medication Sig Dispense Refill  . amLODipine (NORVASC) 5 MG tablet TAKE 1 TABLET BY MOUTH ONCE DAILY 90 tablet 2  . apixaban (ELIQUIS) 2.5 MG TABS tablet Take 1 tablet (2.5 mg total) by mouth 2 (two) times daily. 2 tablet 0  . Blood Glucose Monitoring Suppl (ONE TOUCH ULTRA 2) W/DEVICE KIT     . Casanthranol-Docusate Sodium 30-100 MG CAPS Take by mouth daily.    . Cholecalciferol (VITAMIN D) 2000 units CAPS Take 1 capsule (2,000 Units total) by mouth daily. 30 capsule   . dorzolamide-timolol (COSOPT) 22.3-6.8 MG/ML ophthalmic solution Place 1 drop into both eyes 2 (two) times  daily. 10 mL   . doxazosin (CARDURA) 8 MG tablet TAKE 1 TABLET BY MOUTH AT BEDTIME 90 tablet 1  . ferrous sulfate 325 (65 FE) MG tablet TAKE 1 TABLET BY MOUTH ONCE A DAY WITH BREAKFAST 90 tablet 0  . furosemide (LASIX) 20 MG tablet TAKE 1 TABLET BY MOUTH ONCE A DAY 30 tablet 11  . glucose blood (ONE TOUCH ULTRA TEST) test strip USE TO CHECK BLOOD SUGAR 3 TIMES DAILY Dx: E11.42 300 each 2  . latanoprost (XALATAN) 0.005 % ophthalmic solution Place 1 drop into both eyes at bedtime. 2.5 mL   . lisinopril-hydrochlorothiazide (PRINZIDE,ZESTORETIC) 20-25 MG tablet TAKE 1 TABLET BY MOUTH ONCE A DAY 90 tablet 2  . Multiple Vitamins-Minerals (PRESERVISION AREDS) CAPS Take 2 capsules by mouth daily.    . ONGlory RosebushICA LANCETS MISC Use as directed    . potassium chloride (K-DUR) 10 MEQ tablet TAKE 1 TABLET BY MOUTH DAILY AS NEEDED 30 tablet 3  . pravastatin (PRAVACHOL) 40 MG tablet TAKE 1 TABLET BY MOUTH AT BEDTIME 90 tablet 1   No facility-administered medications prior to visit.      Per HPI unless specifically indicated in ROS section below Review of Systems     Objective:    BP (!) 154/70 (BP Location: Right Arm, Cuff Size: Normal)   Pulse 65   Temp 97.9 F (36.6 C) (Oral)   Wt 163 lb (73.9 kg)   SpO2  99%   BMI 25.53 kg/m   Wt Readings from Last 3 Encounters:  03/25/17 163 lb (73.9 kg)  01/08/17 164 lb 14.4 oz (74.8 kg)  11/13/16 164 lb (74.4 kg)    Physical Exam  Constitutional: He appears well-developed and well-nourished. No distress.  HENT:  Head: Normocephalic and atraumatic.  Mouth/Throat: Oropharynx is clear and moist. No oropharyngeal exudate.  VERY HARD OF HEARING Wears hearing aid L ear but not functioning properly No cerumen in canals  Eyes: Conjunctivae and EOM are normal. Pupils are equal, round, and reactive to light. No scleral icterus.  Neck: Normal range of motion. Neck supple. No thyromegaly present.  Cardiovascular: Normal rate, normal heart sounds and intact  distal pulses. An irregularly irregular rhythm present.  No murmur heard. Pulmonary/Chest: Effort normal and breath sounds normal. No respiratory distress. He has no wheezes. He has no rales.  Musculoskeletal: He exhibits edema (1+ pedal edema at ankles).  Lymphadenopathy:    He has no cervical adenopathy.  Skin: Skin is warm and dry. No rash noted.  Psychiatric: He has a normal mood and affect.  Nursing note and vitals reviewed.  Results for orders placed or performed in visit on 03/25/17  POCT Glucose (Device for Home Use)  Result Value Ref Range   Glucose Fasting, POC  70 - 99 mg/dL   POC Glucose 150 (A) 70 - 99 mg/dl   Lab Results  Component Value Date   HGBA1C 6.4 09/16/2016       Assessment & Plan:   Problem List Items Addressed This Visit    Anemia in chronic kidney disease    Started on procrit.       Atrial fibrillation with slow ventricular response (HCC)    Irregular on exam today. Update yearly EKG next visit.  Continue eliquis which he seems to be tolerating well.       CKD stage 4 due to type 2 diabetes mellitus (HCC)    Chronic. Update renal panel.      Relevant Orders   Renal function panel   Controlled type 2 diabetes mellitus with diabetic nephropathy, without long-term current use of insulin (HCC) - Primary    Chronic, stable. Update A1c. Pt requests cbg today - 150.      Relevant Orders   Hemoglobin A1c   Renal function panel   POCT Glucose (Device for Home Use) (Completed)   Hearing loss    MARKED. Wears hearing aide but not functioning properly. Has audiologist f/u scheduled for next week. I again advised he is not safe to drive until hearing has improved.      HTN (hypertension)    Chronic, remains elevated. Pt endorses compliance with current antihypertensive regimen. Will check labs today and decide on possible increased lasix dosing pending results.       Monoclonal gammopathy of unknown significance (MGUS)    Appreciate heme/onc care.        Pedal edema    Persistent pedal edema - hesitant to increase amlodipine dose. Consider higher lasix dose - see below.           Follow up plan: Return in about 6 months (around 09/23/2017).  [name], MD

## 2017-03-25 NOTE — Assessment & Plan Note (Signed)
Persistent pedal edema - hesitant to increase amlodipine dose. Consider higher lasix dose - see below.

## 2017-03-25 NOTE — Assessment & Plan Note (Signed)
Started on procrit.

## 2017-03-25 NOTE — Assessment & Plan Note (Addendum)
Chronic, remains elevated. Pt endorses compliance with current antihypertensive regimen. Will check labs today and decide on possible increased lasix dosing pending results.

## 2017-03-25 NOTE — Assessment & Plan Note (Signed)
MARKED. Wears hearing aide but not functioning properly. Has audiologist f/u scheduled for next week. I again advised he is not safe to drive until hearing has improved.

## 2017-03-25 NOTE — Assessment & Plan Note (Signed)
Chronic. Update renal panel.

## 2017-03-25 NOTE — Assessment & Plan Note (Signed)
Chronic, stable. Update A1c. Pt requests cbg today - 150.

## 2017-03-25 NOTE — Assessment & Plan Note (Signed)
Appreciate heme/onc care.  

## 2017-03-25 NOTE — Patient Instructions (Addendum)
Fingerstick cbg today. I don't want you driving until hearing is better.  Labs today.  Return in 6 months for medicare wellness visit with Katha Cabal and follow up visit with me.

## 2017-03-27 ENCOUNTER — Other Ambulatory Visit: Payer: Self-pay | Admitting: Family Medicine

## 2017-03-27 MED ORDER — AMLODIPINE BESYLATE 10 MG PO TABS: 10.0000 mg | ORAL_TABLET | Freq: Every day | ORAL | 1 refills | Status: DC

## 2017-03-27 MED ORDER — LISINOPRIL 20 MG PO TABS: 20.0000 mg | ORAL_TABLET | Freq: Every day | ORAL | 1 refills | Status: DC

## 2017-03-31 DIAGNOSIS — H6123 Impacted cerumen, bilateral: Secondary | ICD-10-CM | POA: Diagnosis not present

## 2017-03-31 DIAGNOSIS — H60393 Other infective otitis externa, bilateral: Secondary | ICD-10-CM | POA: Diagnosis not present

## 2017-04-02 ENCOUNTER — Inpatient Hospital Stay: Payer: Medicare Other

## 2017-04-02 ENCOUNTER — Inpatient Hospital Stay: Payer: Medicare Other | Admitting: Hematology and Oncology

## 2017-04-14 DIAGNOSIS — L578 Other skin changes due to chronic exposure to nonionizing radiation: Secondary | ICD-10-CM | POA: Diagnosis not present

## 2017-04-14 DIAGNOSIS — L570 Actinic keratosis: Secondary | ICD-10-CM | POA: Diagnosis not present

## 2017-04-14 DIAGNOSIS — Z85828 Personal history of other malignant neoplasm of skin: Secondary | ICD-10-CM | POA: Diagnosis not present

## 2017-04-14 DIAGNOSIS — Z85820 Personal history of malignant melanoma of skin: Secondary | ICD-10-CM | POA: Diagnosis not present

## 2017-04-14 DIAGNOSIS — Z872 Personal history of diseases of the skin and subcutaneous tissue: Secondary | ICD-10-CM | POA: Diagnosis not present

## 2017-04-14 DIAGNOSIS — Z1283 Encounter for screening for malignant neoplasm of skin: Secondary | ICD-10-CM | POA: Diagnosis not present

## 2017-04-19 DIAGNOSIS — H401131 Primary open-angle glaucoma, bilateral, mild stage: Secondary | ICD-10-CM | POA: Diagnosis not present

## 2017-04-20 ENCOUNTER — Telehealth: Payer: Self-pay | Admitting: *Deleted

## 2017-04-20 ENCOUNTER — Other Ambulatory Visit: Payer: Self-pay | Admitting: Hematology and Oncology

## 2017-04-20 NOTE — Telephone Encounter (Signed)
  Patient was to be seen in f/u mid 03/2017.  M

## 2017-04-21 ENCOUNTER — Ambulatory Visit: Payer: Self-pay

## 2017-04-21 NOTE — Telephone Encounter (Signed)
Received phone call from pt's. brother.  Reported the pt. has had a stuffy nose and productive cough for past 6 days.  Unsure if he has a fever; reported "he stays cold all the time."  Reported coughing up yellow mucus. Denied any c/o sinus pressure, wheezing, or chest pain/ pressure.  Started to review home care advice per Home Care Protocol, but the brother verbalized being overwhelmed in treating this at home.  Appt. scheduled with his PCP 04/22/17.  Advised brother to take pt. to ER with high fever, shortness of breath, or chest pain.  Verb. Understanding.  Agreed with plan.   Reason for Disposition . Cough  Answer Assessment - Initial Assessment Questions 1. ONSET: "When did the cough begin?"      Friday 2. SEVERITY: "How bad is the cough today?"      moderate 3. RESPIRATORY DISTRESS: "Describe your breathing."      No labored breathing 4. FEVER: "Do you have a fever?" If so, ask: "What is your temperature, how was it measured, and when did it start?"     Unknown; "he stays cold all the time" 5. SPUTUM: "Describe the color of your sputum" (clear, white, yellow, green)     yellow 6. HEMOPTYSIS: "Are you coughing up any blood?" If so ask: "How much?" (flecks, streaks, tablespoons, etc.)     no 7. CARDIAC HISTORY: "Do you have any history of heart disease?" (e.g., heart attack, congestive heart failure)      No cardiac hx 8. LUNG HISTORY: "Do you have any history of lung disease?"  (e.g., pulmonary embolus, asthma, emphysema)     No pulmonary hx 9. PE RISK FACTORS: "Do you have a history of blood clots?" (or: recent major surgery, recent prolonged travel, bedridden )     no 10. OTHER SYMPTOMS: "Do you have any other symptoms?" (e.g., runny nose, wheezing, chest pain)       Says his head is stuffed up; denied wheezing; denied chest pain 11. PREGNANCY: "Is there any chance you are pregnant?" "When was your last menstrual period?"       n/a 12. TRAVEL: "Have you traveled out of the country  in the last month?" (e.g., travel history, exposures)       no  Protocols used: [place]

## 2017-04-22 ENCOUNTER — Ambulatory Visit (INDEPENDENT_AMBULATORY_CARE_PROVIDER_SITE_OTHER): Payer: Medicare Other | Admitting: Family Medicine

## 2017-04-22 ENCOUNTER — Encounter: Payer: Self-pay | Admitting: Family Medicine

## 2017-04-22 VITALS — BP 132/68 | HR 80 | Temp 98.2°F | Wt 176.0 lb

## 2017-04-22 DIAGNOSIS — R600 Localized edema: Secondary | ICD-10-CM

## 2017-04-22 DIAGNOSIS — J209 Acute bronchitis, unspecified: Secondary | ICD-10-CM | POA: Insufficient documentation

## 2017-04-22 DIAGNOSIS — F4321 Adjustment disorder with depressed mood: Secondary | ICD-10-CM | POA: Insufficient documentation

## 2017-04-22 DIAGNOSIS — H9193 Unspecified hearing loss, bilateral: Secondary | ICD-10-CM

## 2017-04-22 MED ORDER — LISINOPRIL 20 MG PO TABS: 20.0000 mg | ORAL_TABLET | Freq: Every day | ORAL | 1 refills | Status: DC

## 2017-04-22 MED ORDER — AMLODIPINE BESYLATE 5 MG PO TABS: 5.0000 mg | ORAL_TABLET | Freq: Every day | ORAL | 1 refills | Status: DC

## 2017-04-22 MED ORDER — AZITHROMYCIN 250 MG PO TABS: ORAL_TABLET | ORAL | 0 refills | Status: DC

## 2017-04-22 MED ORDER — APIXABAN 2.5 MG PO TABS: 2.5000 mg | ORAL_TABLET | Freq: Two times a day (BID) | ORAL | 6 refills | Status: AC

## 2017-04-22 NOTE — Progress Notes (Signed)
BP 132/68 (BP Location: Left Arm, Patient Position: Sitting, Cuff Size: Normal)   Pulse 80   Temp 98.2 F (36.8 C) (Oral)   Wt 176 lb (79.8 kg)   SpO2 96%   BMI 27.57 kg/m    CC: cough, foot swelling Subjective:    Patient ID: Charles Rodgers, male    DOB: 1924/04/07, 84 y.o.   MRN: [id number]  HPI: Charles Rodgers is a 84 y.o. male presenting on 04/22/2017 for Cough (Productive. Started about 1 wk ago. Tried OTC cough and sinus meds) and Foot Swelling (Has had off and on for awhile)   HARD OF HEARING.  Here with brother.  Wife passed away 2 wks ago.   5-6 d h/o cough and head > chest congestion. Trouble expectorating. Appetite ok.  No fevers/chills, HA, ST. No dyspnea or wheeze. No ear or tooth pain.   Has tried delsym cough syrup, OTC decongestant.  + sick contacts at home.  No h/o asthma Ex smoker.   Relevant past medical, surgical, family and social history reviewed and updated as indicated. Interim medical history since our last visit reviewed. Allergies and medications reviewed and updated. Outpatient Medications Prior to Visit  Medication Sig Dispense Refill  . Blood Glucose Monitoring Suppl (ONE TOUCH ULTRA 2) W/DEVICE KIT     . Casanthranol-Docusate Sodium 30-100 MG CAPS Take by mouth daily.    . Cholecalciferol (VITAMIN D) 2000 units CAPS Take 1 capsule (2,000 Units total) by mouth daily. 30 capsule   . dorzolamide-timolol (COSOPT) 22.3-6.8 MG/ML ophthalmic solution Place 1 drop into both eyes 2 (two) times daily. 10 mL   . doxazosin (CARDURA) 8 MG tablet TAKE 1 TABLET BY MOUTH AT BEDTIME 90 tablet 1  . ferrous sulfate 325 (65 FE) MG tablet TAKE 1 TABLET BY MOUTH ONCE A DAY WITH BREAKFAST 90 tablet 0  . furosemide (LASIX) 20 MG tablet TAKE 1 TABLET BY MOUTH ONCE A DAY 30 tablet 11  . glucose blood (ONE TOUCH ULTRA TEST) test strip USE TO CHECK BLOOD SUGAR 3 TIMES DAILY Dx: E11.42 300 each 2  . latanoprost (XALATAN) 0.005 % ophthalmic solution Place 1  drop into both eyes at bedtime. 2.5 mL   . Multiple Vitamins-Minerals (PRESERVISION AREDS) CAPS Take 2 capsules by mouth daily.    Glory Rosebush DELICA LANCETS MISC Use as directed    . potassium chloride (K-DUR) 10 MEQ tablet TAKE 1 TABLET BY MOUTH DAILY AS NEEDED 30 tablet 3  . pravastatin (PRAVACHOL) 40 MG tablet TAKE 1 TABLET BY MOUTH AT BEDTIME 90 tablet 1  . amLODipine (NORVASC) 10 MG tablet Take 1 tablet (10 mg total) by mouth daily. 90 tablet 1  . apixaban (ELIQUIS) 2.5 MG TABS tablet Take 1 tablet (2.5 mg total) by mouth 2 (two) times daily. 2 tablet 0  . lisinopril (PRINIVIL,ZESTRIL) 20 MG tablet Take 1 tablet (20 mg total) by mouth daily. 90 tablet 1  . lisinopril-hydrochlorothiazide (PRINZIDE,ZESTORETIC) 20-25 MG tablet TAKE 1 TABLET BY MOUTH ONCE A DAY 90 tablet 2   No facility-administered medications prior to visit.      Per HPI unless specifically indicated in ROS section below Review of Systems     Objective:    BP 132/68 (BP Location: Left Arm, Patient Position: Sitting, Cuff Size: Normal)   Pulse 80   Temp 98.2 F (36.8 C) (Oral)   Wt 176 lb (79.8 kg)   SpO2 96%   BMI 27.57 kg/m   Wt Readings from Last  3 Encounters:  04/22/17 176 lb (79.8 kg)  03/25/17 163 lb (73.9 kg)  01/08/17 164 lb 14.4 oz (74.8 kg)    Physical Exam  Constitutional: He appears well-developed and well-nourished. No distress.  HENT:  Head: Normocephalic and atraumatic.  Right Ear: Hearing, tympanic membrane, external ear and ear canal normal.  Left Ear: Hearing, tympanic membrane, external ear and ear canal normal.  Nose: Nose normal. No mucosal edema or rhinorrhea. Right sinus exhibits no maxillary sinus tenderness and no frontal sinus tenderness. Left sinus exhibits no maxillary sinus tenderness and no frontal sinus tenderness.  Mouth/Throat: Uvula is midline, oropharynx is clear and moist and mucous membranes are normal. No oropharyngeal exudate, posterior oropharyngeal edema, posterior  oropharyngeal erythema or tonsillar abscesses.  Eyes: Conjunctivae and EOM are normal. Pupils are equal, round, and reactive to light. No scleral icterus.  Neck: Normal range of motion. Neck supple.  Cardiovascular: Normal rate, regular rhythm, normal heart sounds and intact distal pulses.  No murmur heard. Pulmonary/Chest: Effort normal. No respiratory distress. He has decreased breath sounds. He has no wheezes. He has rhonchi (diffuse). He has no rales.  Coarse throughout  Lymphadenopathy:    He has no cervical adenopathy.  Skin: Skin is warm and dry. No rash noted.  Nursing note and vitals reviewed.  Results for orders placed or performed in visit on 03/25/17  Hemoglobin A1c  Result Value Ref Range   Hgb A1c MFr Bld 6.3 4.6 - 6.5 %  Renal function panel  Result Value Ref Range   Sodium 138 135 - 145 mEq/L   Potassium 4.1 3.5 - 5.1 mEq/L   Chloride 109 96 - 112 mEq/L   CO2 23 19 - 32 mEq/L   Calcium 8.8 8.4 - 10.5 mg/dL   Albumin 3.8 3.5 - 5.2 g/dL   BUN 57 (H) 6 - 23 mg/dL   Creatinine, Ser 1.88 (H) 0.40 - 1.50 mg/dL   Glucose, Bld 107 (H) 70 - 99 mg/dL   Phosphorus 4.1 2.3 - 4.6 mg/dL   GFR 35.98 (L) >60.00 mL/min  POCT Glucose (Device for Home Use)  Result Value Ref Range   Glucose Fasting, POC  70 - 99 mg/dL   POC Glucose 150 (A) 70 - 99 mg/dl      Assessment & Plan:   Problem List Items Addressed This Visit    Acute bronchitis - Primary    Given age and comorbidities and lung exam, treat aggressively with zpack antibiotic sent to pharmacy. Discussed plain mucinex use. Advised to return for re evaluation and CXR if not improving with treatment. Pt and brother agree with plan.       Grieving    Wife passed away 2 wks ago (2017/04/18). Support provided. Daughter and brother have stayed with patient several nights over last few weeks. They are concerned with living situation from here on. Provided with # for [employer] for resources.       HOH (hard of hearing)      SEVERE. Again advised against driving in presence of brother. Brother agrees. Pt expresses understanding.  It seems hearing aides have not helped the situation.       Pedal edema    Worsening - will decease amlodipine back to 59m daily. Will increase lasix to 2 tab daily x 3 days then return to 230mdaily dose. Update with effect.          Follow up plan: Return if symptoms worsen or fail to improve.  JaRia Bush  MD

## 2017-04-22 NOTE — Assessment & Plan Note (Signed)
Worsening - will decease amlodipine back to 5mg  daily. Will increase lasix to 2 tab daily x 3 days then return to 20mg  daily dose. Update with effect.

## 2017-04-22 NOTE — Assessment & Plan Note (Signed)
Given age and comorbidities and lung exam, treat aggressively with zpack antibiotic sent to pharmacy. Discussed plain mucinex use. Advised to return for re evaluation and CXR if not improving with treatment. Pt and brother agree with plan.

## 2017-04-22 NOTE — Assessment & Plan Note (Signed)
Wife passed away 2 wks ago (04/02/2017). Support provided. Daughter and brother have stayed with patient several nights over last few weeks. They are concerned with living situation from here on. Provided with # for [employer] for resources.

## 2017-04-22 NOTE — Assessment & Plan Note (Signed)
SEVERE. Again advised against driving in presence of brother. Brother agrees. Pt expresses understanding.  It seems hearing aides have not helped the situation.

## 2017-04-22 NOTE — Patient Instructions (Addendum)
No more driving - your hearing is very bad.  I think leg swelling is coming from higher amlodipine dose - decrease to 5mg  once daily (you were taking 10mg  daily).  Increase lasix (furosemide) to 2 a day for 3 days then back down to regular dose of 1 a day.  For head congestion - take plain mucinex with large glass of water to help mobilize mucous. Start antibiotic sent to pharmacy.  If not improving after antibiotics, return to see me.

## 2017-04-28 DIAGNOSIS — L719 Rosacea, unspecified: Secondary | ICD-10-CM | POA: Diagnosis not present

## 2017-04-28 DIAGNOSIS — L570 Actinic keratosis: Secondary | ICD-10-CM | POA: Diagnosis not present

## 2017-04-28 DIAGNOSIS — L578 Other skin changes due to chronic exposure to nonionizing radiation: Secondary | ICD-10-CM | POA: Diagnosis not present

## 2017-04-28 DIAGNOSIS — Z85828 Personal history of other malignant neoplasm of skin: Secondary | ICD-10-CM | POA: Diagnosis not present

## 2017-04-28 DIAGNOSIS — Z85820 Personal history of malignant melanoma of skin: Secondary | ICD-10-CM | POA: Diagnosis not present

## 2017-04-30 ENCOUNTER — Encounter: Payer: Self-pay | Admitting: Hematology and Oncology

## 2017-04-30 ENCOUNTER — Inpatient Hospital Stay (HOSPITAL_BASED_OUTPATIENT_CLINIC_OR_DEPARTMENT_OTHER): Payer: Medicare Other | Admitting: Hematology and Oncology

## 2017-04-30 ENCOUNTER — Other Ambulatory Visit: Payer: Self-pay | Admitting: *Deleted

## 2017-04-30 ENCOUNTER — Inpatient Hospital Stay: Payer: Medicare Other

## 2017-04-30 ENCOUNTER — Inpatient Hospital Stay: Payer: Medicare Other | Attending: Hematology and Oncology

## 2017-04-30 ENCOUNTER — Other Ambulatory Visit: Payer: Self-pay

## 2017-04-30 ENCOUNTER — Other Ambulatory Visit: Payer: Self-pay | Admitting: Hematology and Oncology

## 2017-04-30 VITALS — BP 152/59 | HR 64 | Temp 97.7°F | Resp 18 | Wt 180.2 lb

## 2017-04-30 DIAGNOSIS — D472 Monoclonal gammopathy: Secondary | ICD-10-CM

## 2017-04-30 DIAGNOSIS — N183 Chronic kidney disease, stage 3 (moderate): Secondary | ICD-10-CM | POA: Insufficient documentation

## 2017-04-30 DIAGNOSIS — N184 Chronic kidney disease, stage 4 (severe): Principal | ICD-10-CM

## 2017-04-30 DIAGNOSIS — D631 Anemia in chronic kidney disease: Secondary | ICD-10-CM

## 2017-04-30 DIAGNOSIS — E1122 Type 2 diabetes mellitus with diabetic chronic kidney disease: Secondary | ICD-10-CM | POA: Diagnosis not present

## 2017-04-30 DIAGNOSIS — D508 Other iron deficiency anemias: Secondary | ICD-10-CM

## 2017-04-30 DIAGNOSIS — D696 Thrombocytopenia, unspecified: Secondary | ICD-10-CM | POA: Diagnosis not present

## 2017-04-30 DIAGNOSIS — I219 Acute myocardial infarction, unspecified: Secondary | ICD-10-CM | POA: Diagnosis not present

## 2017-04-30 DIAGNOSIS — I129 Hypertensive chronic kidney disease with stage 1 through stage 4 chronic kidney disease, or unspecified chronic kidney disease: Secondary | ICD-10-CM | POA: Insufficient documentation

## 2017-04-30 LAB — COMPREHENSIVE METABOLIC PANEL
ALT: 17 U/L (ref 17–63)
AST: 24 U/L (ref 15–41)
Albumin: 3.4 g/dL — ABNORMAL LOW (ref 3.5–5.0)
Alkaline Phosphatase: 65 U/L (ref 38–126)
Anion gap: 7 (ref 5–15)
BUN: 32 mg/dL — ABNORMAL HIGH (ref 6–20)
CO2: 23 mmol/L (ref 22–32)
Calcium: 8.4 mg/dL — ABNORMAL LOW (ref 8.9–10.3)
Chloride: 107 mmol/L (ref 101–111)
Creatinine, Ser: 1.79 mg/dL — ABNORMAL HIGH (ref 0.61–1.24)
GFR calc Af Amer: 37 mL/min — ABNORMAL LOW (ref >60)
GFR calc non Af Amer: 32 mL/min — ABNORMAL LOW (ref >60)
Glucose, Bld: 110 mg/dL — ABNORMAL HIGH (ref 65–99)
Potassium: 4.2 mmol/L (ref 3.5–5.1)
Sodium: 137 mmol/L (ref 135–145)
Total Bilirubin: 0.5 mg/dL (ref 0.3–1.2)
Total Protein: 6.4 g/dL — ABNORMAL LOW (ref 6.5–8.1)

## 2017-04-30 LAB — CBC WITH DIFFERENTIAL/PLATELET
Basophils Absolute: 0 10*3/uL (ref 0–0.1)
Basophils Relative: 0 %
Eosinophils Absolute: 0.1 10*3/uL (ref 0–0.7)
Eosinophils Relative: 2 %
HCT: 26 % — ABNORMAL LOW (ref 40.0–52.0)
Hemoglobin: 8.4 g/dL — ABNORMAL LOW (ref 13.0–18.0)
Lymphocytes Relative: 11 %
Lymphs Abs: 0.7 10*3/uL — ABNORMAL LOW (ref 1.0–3.6)
MCH: 30.6 pg (ref 26.0–34.0)
MCHC: 32.2 g/dL (ref 32.0–36.0)
MCV: 95 fL (ref 80.0–100.0)
Monocytes Absolute: 0.4 10*3/uL (ref 0.2–1.0)
Monocytes Relative: 7 %
Neutro Abs: 4.8 10*3/uL (ref 1.4–6.5)
Neutrophils Relative %: 80 %
Platelets: 78 10*3/uL — ABNORMAL LOW (ref 150–440)
RBC: 2.74 MIL/uL — ABNORMAL LOW (ref 4.40–5.90)
RDW: 14.9 % — ABNORMAL HIGH (ref 11.5–14.5)
WBC: 6.1 10*3/uL (ref 3.8–10.6)

## 2017-04-30 LAB — FERRITIN: Ferritin: 69 ng/mL (ref 24–336)

## 2017-04-30 MED ORDER — EPOETIN ALFA 10000 UNIT/ML IJ SOLN
10000.0000 [IU] | Freq: Once | INTRAMUSCULAR | Status: AC
  Administered 2017-04-30: 10000 [IU] via SUBCUTANEOUS
  Filled 2017-04-30: qty 2

## 2017-04-30 NOTE — Progress Notes (Signed)
[facility] day:   04/30/2017   Chief Complaint: Charles Rodgers is an 84 y.o. male with anemia of chronic renal disease, monoclonal gammopathy of unknown significance (MGUS), and mild thrombocytopenia who is seen for 3 month assessment on Procrit.  HPI: The patient was last seen in the medical oncology clinic on 01/08/2017.  At that time, he was feeling well.  Weight was stable.  Exam reveals no adenopathy or hepatosplenomegaly.  Hematocrit was 30.2 with a hemoglobin of 9.7.  Platelet count  was 91,000. Creatinine was 1.99.  He received Procrit.  Hemoglobin has ranged from 9.0 - 9.1 during the interim.  He has received Procrit 10,000 units every 2-4 weeks (when labs checked).  Last hemoglobin was 9.1 on 03/19/2017.  During the interim, patient has been doing "ok".  Patient notes that his energy is about "half of what it was". Patient denies chest pain and increased shortness of breath. Patient denies bleeding; no hematochezia, melena, or gross hematuria. He denies any bruising. Patient has congestion and his ears are stopped up. He just completed a 5 day course of Azithromycin.   He lost his wife about 3 weeks ago. Patient lives alone, however "has people coming in" to help him. Patient eating well. His weight is up 16 pounds since his last visit in October 2018.    Past Medical History:  Diagnosis Date  . Abscess of axilla, left 02/07/2015  . Arthritis   . BPH (benign prostatic hypertrophy)    with elevated PSA, s/p benign biopsy, followed yearly by [facility] (last seen 01/16/2013)  . CKD (chronic kidney disease) stage 3, GFR 30-59 ml/min ([place])    [place] -> 2017 requested PCP start following  . COPD (chronic obstructive pulmonary disease) ([place]) 2016   by xray  . Diabetes mellitus type 2, controlled ([place])   . Glaucoma   . Hearing loss   . HLD (hyperlipidemia)   . HTN (hypertension)   . IgM monoclonal gammopathy of uncertain significance  08/27/2014   07/2014 suggest rpt 6 mo.  . Malignant melanoma of left upper arm ([place]) 11/14/2014   s/p excision complicated by infected axillary seroma (Dr Carmin Muskrat)  . Melanoma ([place]) 2014   nose and left hand  . Secondary hyperparathyroidism ([place])   . Subclavian artery stenosis, right ([place]) 2014   found on carotid US, seen [facility], recommended recheck 1 yr at vascular lab with Dr. Delana Meyer  . Thyroid nodule 02/2013   right upper lobe - s/p biopsy - benign follicular nodule  . Urine incontinence     Past Surgical History:  Procedure Laterality Date  . APPENDECTOMY  1938  . arm surgery     Right after trauma  . BIOPSY THYROID Right [id number]/6468   benign follicular nodule  . carotid US  07/2007   WNL  . 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 US ECHOCARDIOGRAPHY  06/2007   EF 55%, mod dil LA, mild LVH,mild pulm HTN    Family History  Problem Relation Age of Onset  . Cancer Mother        stomach  . Cancer Brother        colon  . Cancer Brother        colon  . Cancer Sister        hodgkin's lymphoma  . Cancer Brother        prostate  . Diabetes Brother   .  Coronary artery disease Neg Hx   . Stroke Neg Hx     Social History:  reports that he quit smoking about 50 years ago. His smoking use included cigarettes. He has a 7.50 pack-year smoking history. he has never used smokeless tobacco. He reports that he does not drink alcohol or use drugs.  His wife has Alzheimer's.  His daughter, Loletha Carrow, brings food to the house.  His brother's [name]) phone number is:  [phone].  The patient is accompanied by his brother [name]) today.   Allergies: No Known Allergies  Current Medications: Current Outpatient Medications  Medication Sig Dispense Refill  . amLODipine (NORVASC) 5 MG tablet Take 1 tablet (5 mg total) by mouth daily. 90 tablet 1  . apixaban (ELIQUIS) 2.5 MG TABS tablet Take 1 tablet (2.5 mg total) by mouth 2 (two) times daily. 60  tablet 6  . Blood Glucose Monitoring Suppl (ONE TOUCH ULTRA 2) W/DEVICE KIT     . Casanthranol-Docusate Sodium 30-100 MG CAPS Take by mouth daily.    . Cholecalciferol (VITAMIN D) 2000 units CAPS Take 1 capsule (2,000 Units total) by mouth daily. 30 capsule   . dorzolamide-timolol (COSOPT) 22.3-6.8 MG/ML ophthalmic solution Place 1 drop into both eyes 2 (two) times daily. 10 mL   . doxazosin (CARDURA) 8 MG tablet TAKE 1 TABLET BY MOUTH AT BEDTIME 90 tablet 1  . ferrous sulfate 325 (65 FE) MG tablet TAKE 1 TABLET BY MOUTH ONCE A DAY WITH BREAKFAST 90 tablet 0  . furosemide (LASIX) 20 MG tablet TAKE 1 TABLET BY MOUTH ONCE A DAY 30 tablet 11  . glucose blood (ONE TOUCH ULTRA TEST) test strip USE TO CHECK BLOOD SUGAR 3 TIMES DAILY Dx: E11.42 300 each 2  . latanoprost (XALATAN) 0.005 % ophthalmic solution Place 1 drop into both eyes at bedtime. 2.5 mL   . lisinopril (PRINIVIL,ZESTRIL) 20 MG tablet Take 1 tablet (20 mg total) by mouth daily. 90 tablet 1  . Multiple Vitamins-Minerals (PRESERVISION AREDS) CAPS Take 2 capsules by mouth daily.    Glory Rosebush DELICA LANCETS MISC Use as directed    . potassium chloride (K-DUR) 10 MEQ tablet TAKE 1 TABLET BY MOUTH DAILY AS NEEDED 30 tablet 3  . pravastatin (PRAVACHOL) 40 MG tablet TAKE 1 TABLET BY MOUTH AT BEDTIME 90 tablet 1   No current facility-administered medications for this visit.     Review of Systems:  GENERAL:  Feels "ok".  No fevers or sweats.  Weight up 16 pounds since 12/2016. PERFORMANCE STATUS (ECOG):  1 HEENT:  Hard of hearing.  Sinus congestion (see HPI).  No visual changes, runny nose, sore throat, mouth sores or tenderness. Lungs: No shortness of breath or cough.  No hemoptysis. Cardiac:  No chest pain, palpitations, orthopnea, or PND. GI:  No nausea, vomiting, diarrhea, constipation, melena or hematochezia. GU:  No urgency, frequency, dysuria, or hematuria. Musculoskeletal:  No back pain.  Arthritis pain.  No muscle  tenderness. Extremities:  Bilateral ankle edema (chonic). No pain. Skin:  Several lesions burned off skin by dermatologist.  No rashes or skin changes. Neuro:  No headache, numbness or weakness, balance or coordination issues. Endocrine:  No diabetes, thyroid issues, hot flashes or night sweats. Psych:  Lost wife 3 weeks ago.  No mood changes, depression or anxiety. Pain:  No focal pain. Review of systems:  All other systems reviewed and found to be negative.   Physical Exam:  Blood pressure (!) 152/59, pulse 64, temperature 97.7 F (36.5  C), temperature source Oral, resp. rate 18, weight 180 lb 3.2 oz (81.7 kg). GENERAL:  Well developed, well nourished, elderly gentleman sitting comfortably in the exam room in no acute distress.  He has a cane at his side. MENTAL STATUS:  Alert and oriented to person, place and time. HEAD:  Wearing a black [place] baseball cap.  White hair.  Male pattern baldness.  Normocephalic, atraumatic, face symmetric, no Cushingoid features. EYES:  Glasses.  Hazel/brown eyes.  Pupils equal round and reactive to light and accomodation.  No conjunctivitis or scleral icterus. ENT:  Hard of hearing despite hearing aide.  Oropharynx clear without lesion.  Tongue normal. Mucous membranes moist.  RESPIRATORY:  Clear to auscultation without rales, wheezes or rhonchi. CARDIOVASCULAR:  Regular rate and rhythm without murmur, rub or gallop. ABDOMEN:  Ventral hernia.  Soft, non-tender, with active bowel sounds, and no hepatosplenomegaly.  No masses. SKIN:  Skin changes due to sun exposure.  Multiple areas on face and scalp s/p dermatology burn.  No rashes, ulcers or lesions. EXTREMITIES: Trace ankle edema.  No skin discoloration or tenderness.  No palpable cords. LYMPH NODES: No palpable cervical, supraclavicular, axillary or inguinal adenopathy  NEUROLOGICAL: Unremarkable. PSYCH:  Appropriate.    Appointment on 04/30/2017  Component Date Value Ref Range Status  . WBC 04/30/2017  6.1  3.8 - 10.6 K/uL Final  . RBC 04/30/2017 2.74* 4.40 - 5.90 MIL/uL Final  . Hemoglobin 04/30/2017 8.4* 13.0 - 18.0 g/dL Final  . HCT 04/30/2017 26.0* 40.0 - 52.0 % Final  . MCV 04/30/2017 95.0  80.0 - 100.0 fL Final  . MCH 04/30/2017 30.6  26.0 - 34.0 pg Final  . MCHC 04/30/2017 32.2  32.0 - 36.0 g/dL Final  . RDW 04/30/2017 14.9* 11.5 - 14.5 % Final  . Platelets 04/30/2017 78* 150 - 440 K/uL Final  . Neutrophils Relative % 04/30/2017 80  % Final  . Neutro Abs 04/30/2017 4.8  1.4 - 6.5 K/uL Final  . Lymphocytes Relative 04/30/2017 11  % Final  . Lymphs Abs 04/30/2017 0.7* 1.0 - 3.6 K/uL Final  . Monocytes Relative 04/30/2017 7  % Final  . Monocytes Absolute 04/30/2017 0.4  0.2 - 1.0 K/uL Final  . Eosinophils Relative 04/30/2017 2  % Final  . Eosinophils Absolute 04/30/2017 0.1  0 - 0.7 K/uL Final  . Basophils Relative 04/30/2017 0  % Final  . Basophils Absolute 04/30/2017 0.0  0 - 0.1 K/uL Final   Performed at [facility], [address], [place], [place] [address]  . Sodium 04/30/2017 137  135 - 145 mmol/L Final  . Potassium 04/30/2017 4.2  3.5 - 5.1 mmol/L Final  . Chloride 04/30/2017 107  101 - 111 mmol/L Final  . CO2 04/30/2017 23  22 - 32 mmol/L Final  . Glucose, Bld 04/30/2017 110* 65 - 99 mg/dL Final  . BUN 04/30/2017 32* 6 - 20 mg/dL Final  . Creatinine, Ser 04/30/2017 1.79* 0.61 - 1.24 mg/dL Final  . Calcium 04/30/2017 8.4* 8.9 - 10.3 mg/dL Final  . Total Protein 04/30/2017 6.4* 6.5 - 8.1 g/dL Final  . Albumin 04/30/2017 3.4* 3.5 - 5.0 g/dL Final  . AST 04/30/2017 24  15 - 41 U/L Final  . ALT 04/30/2017 17  17 - 63 U/L Final  . Alkaline Phosphatase 04/30/2017 65  38 - 126 U/L Final  . Total Bilirubin 04/30/2017 0.5  0.3 - 1.2 mg/dL Final  . GFR calc non Af Amer 04/30/2017 32* >60 mL/min Final  .  GFR calc Af Amer 04/30/2017 37* >60 mL/min Final   Comment: (NOTE) The eGFR has been calculated using the CKD EPI equation. This calculation has not been validated in  all clinical situations. eGFR's persistently <60 mL/min signify possible Chronic Kidney Disease.   Georgiann Hahn gap 04/30/2017 7  5 - 15 Final   Performed at [facility], [address] [place], [place] [address]    Assessment:  Charles Rodgers is an 84 y.o. male with mild cytopenias likely multi-factorial in nature (renal insufficiency and anemia of chronic disease).  He has a 2 year history of a normocytic anemia, thrombocytopenia and a normal white count.    He has a monoclonal gammopathy of unknown significance (MGUS).   Immunoglobulin levels were normal.  24 hour UPEP revealed no monoclonal protein on 09/14/2014  SPEP has been followed: 0.3 gm/dL on 08/15/2014, 0.4 gm.dL on 09/11/2014, 0.3 gm/dL on 05/07/2016, and 0.2 gm/dL on 10/09/2016.   Kappa free light chains have been followed:  147.5 (ratio 4.56) on 09/11/2014, 153.6 (ratio 3.35) on 05/07/2016, and 182 (ratio 3.28) on 10/09/2016.  Bone survey on 10/03/2014 revealed subtle lucencies within the calvarium, left femoral head, and proximal left tibia which could reflect myelomatous involvement.  Bone survey on 10/03/2015 revealed no definite radiolucent lesions. There was diffuse osteopenia.  There were multiple old healed right lateral rib fractures.  Bone marrow aspirate and biopsy on 10/15/2015 revealed no significant dysplasia or abnormal or overt neoplasia. Marrow was slightly hypercellular for age (40%) with maturing trilineage hematopoiesis, relatively diminished erythroid precursors, and adequate megakaryocytes. There was marginally increased polytypic plasma cells (5%). There was diffuse mild increase in reticulin. There were trace iron stores.  Flow cytometry revealed a low level monoclonal B population (1%) with nonspecific immunophenotype.  Cytogenetics were normal (46, XY) with a normal SNP microarray.   He has chronic renal insufficiency (Cr 1.7 - 2.03) over the past 2 years without trend.  Creatinine was 1.90 (CrCl  24.6 ml/min) on 09/16/2016.  He denies any new medications or herbal products.  Ferritin was 48 on 08/06/2016.  Peripheral smear on 08/27/2014 revealed no abnormal white blood cells, thrombocytopenia without clumping and some large platelets. There were elliptocytes, teardrop red blood cells, and schistocytes. Teardrop RBCs and elliptocytes suggest possible extramedullary hematopoiesis (myelofibrosis).  Myelodysplastic syndrome with deletion 20q was a consideration.  Work-up on 09/11/2014 included CBC with a hematocrit 30.4, hemoglobin 9.7, MCV 95.1, platelets 80,000, white count 5300 with an [place] of 3800. Differential was unremarkable.  B12, folate, TSH, ANA, PT, PTT, LDH.  Retic was 0.7%. Ferritin was 47 with a iron saturation of 20% and a TIBC of 265 (normal).  B12 and folate were normal on 08/17/2016.  Retic was 0.8% on 08/17/2016.  Epo level was 12.9 on 08/17/2016.   He began Procrit every 2 weeks on 10/16/2016 (last 01/17/2017).  Hemoglobin ranges between 9.0 - 9.1.  Diet has been poor until recently.  He is on ferrous sulfate.  He denies any melena or hematochezia.  He has never had a colonoscopy.  Guaiac cards were negative 2 on 09/16/2014.   Symptomatically, he denies any physical complaints today.  His weight is up 16 pounds since 12/2016.Marland Kitchen  Exam reveals no adenopathy or hepatosplenomegaly.  Hematocrit is 26.0 with a hemoglobin of 8.4 after missing Procrit injections.  Platelet count is 78,000. Creatinine 1.79 (previously 1.99).  Ferritin is 69.  Plan: 1.  Labs today:  CBC with diff, CMP, SPEP, ferritin. 2.  Discuss  down trend in hemoglobin.  Hemoglobin has decreased from 9.1 to 8.4 since December 2018.  Discuss reinitiation of Procrit at current dose.   3.  Procrit 10,000 units today. 4.  RTC every 2 weeks for labs (Hgb) +/- Procrit. 5.  RTC in 12 weeks (coordinate with above) for MD assessment, labs (CBC with diff, CMP, SPEP, ferritin), and +/- Procrit.   Honor Loh, NP  04/30/2017 ,  2:25 PM   I saw and evaluated the patient, participating in the key portions of the service and reviewing pertinent diagnostic studies and records.  I reviewed the nurse practitioner's note and agree with the findings and the plan.  The assessment and plan were discussed with the patient.  A few questions were asked by the patient and answered.   Nolon Stalls, MD 04/30/2017,2:25 PM

## 2017-04-30 NOTE — Progress Notes (Signed)
Here for follow up. Wearing hearing aides but cannot hear.-also c/o congestion .just finished antibiotics for " congestion " Brother helping w information. Per brother pts wife died 3 weeks ago and unclear if pt can stay alone at home at this time.

## 2017-05-03 LAB — PROTEIN ELECTROPHORESIS, SERUM
A/G Ratio: 1.2 (ref 0.7–1.7)
Albumin ELP: 3.3 g/dL (ref 2.9–4.4)
Alpha-1-Globulin: 0.2 g/dL (ref 0.0–0.4)
Alpha-2-Globulin: 0.6 g/dL (ref 0.4–1.0)
Beta Globulin: 0.8 g/dL (ref 0.7–1.3)
Gamma Globulin: 1.1 g/dL (ref 0.4–1.8)
Globulin, Total: 2.7 g/dL (ref 2.2–3.9)
M-Spike, %: 0.3 g/dL — ABNORMAL HIGH
Total Protein ELP: 6 g/dL (ref 6.0–8.5)

## 2017-05-04 ENCOUNTER — Encounter: Payer: Self-pay | Admitting: Family Medicine

## 2017-05-04 ENCOUNTER — Ambulatory Visit: Payer: Medicare Other | Admitting: Family Medicine

## 2017-05-04 VITALS — BP 134/62 | HR 71 | Temp 98.5°F | Wt 183.0 lb

## 2017-05-04 DIAGNOSIS — H6122 Impacted cerumen, left ear: Secondary | ICD-10-CM | POA: Diagnosis not present

## 2017-05-04 DIAGNOSIS — J209 Acute bronchitis, unspecified: Secondary | ICD-10-CM | POA: Diagnosis not present

## 2017-05-04 DIAGNOSIS — H9193 Unspecified hearing loss, bilateral: Secondary | ICD-10-CM

## 2017-05-04 NOTE — Progress Notes (Signed)
BP 134/62 (BP Location: Left Arm, Patient Position: Sitting, Cuff Size: Normal)   Pulse 71   Temp 98.5 F (36.9 C) (Oral)   Wt 183 lb (83 kg)   SpO2 97%   BMI 28.66 kg/m    CC: cough, URI Subjective:    Patient ID: Charles Rodgers, male    DOB: 05/06/1924, 84 y.o.   MRN: [id number]  HPI: Charles Rodgers is a 84 y.o. male presenting on 05/04/2017 for Cough (Cough has improved.); URI (Had chest and head congestion. Chest congestion has improved, still has head congestion. Still taking plain Mucinex.); and Leg Swelling (Pt accompanied by his brother, Richard Westall.  States pt is still having trouble with leg swelling.)   HARD OF HEARING.  Here with brother.  Wife passed away 4 wks ago.   Persistent concern with hearing loss.   See prior note for details. Seen here 1/24 dx with bronchitis treated with zpack. Feeling some better, but still has head and ear congestion. Continues taking mucinex.   Persistent leg swelling despite decreased amlodipine dose.   Relevant past medical, surgical, family and social history reviewed and updated as indicated. Interim medical history since our last visit reviewed. Allergies and medications reviewed and updated. Outpatient Medications Prior to Visit  Medication Sig Dispense Refill  . amLODipine (NORVASC) 5 MG tablet Take 1 tablet (5 mg total) by mouth daily. 90 tablet 1  . apixaban (ELIQUIS) 2.5 MG TABS tablet Take 1 tablet (2.5 mg total) by mouth 2 (two) times daily. 60 tablet 6  . Blood Glucose Monitoring Suppl (ONE TOUCH ULTRA 2) W/DEVICE KIT     . Casanthranol-Docusate Sodium 30-100 MG CAPS Take by mouth daily.    . Cholecalciferol (VITAMIN D) 2000 units CAPS Take 1 capsule (2,000 Units total) by mouth daily. 30 capsule   . dorzolamide-timolol (COSOPT) 22.3-6.8 MG/ML ophthalmic solution Place 1 drop into both eyes 2 (two) times daily. 10 mL   . doxazosin (CARDURA) 8 MG tablet TAKE 1 TABLET BY MOUTH AT BEDTIME 90 tablet 1  .  ferrous sulfate 325 (65 FE) MG tablet TAKE 1 TABLET BY MOUTH ONCE A DAY WITH BREAKFAST 90 tablet 0  . furosemide (LASIX) 20 MG tablet TAKE 1 TABLET BY MOUTH ONCE A DAY 30 tablet 11  . glucose blood (ONE TOUCH ULTRA TEST) test strip USE TO CHECK BLOOD SUGAR 3 TIMES DAILY Dx: E11.42 300 each 2  . latanoprost (XALATAN) 0.005 % ophthalmic solution Place 1 drop into both eyes at bedtime. 2.5 mL   . lisinopril (PRINIVIL,ZESTRIL) 20 MG tablet Take 1 tablet (20 mg total) by mouth daily. 90 tablet 1  . Multiple Vitamins-Minerals (PRESERVISION AREDS) CAPS Take 2 capsules by mouth daily.    Glory Rosebush DELICA LANCETS MISC Use as directed    . potassium chloride (K-DUR) 10 MEQ tablet TAKE 1 TABLET BY MOUTH DAILY AS NEEDED 30 tablet 3  . pravastatin (PRAVACHOL) 40 MG tablet TAKE 1 TABLET BY MOUTH AT BEDTIME 90 tablet 1   No facility-administered medications prior to visit.      Per HPI unless specifically indicated in ROS section below Review of Systems     Objective:    BP 134/62 (BP Location: Left Arm, Patient Position: Sitting, Cuff Size: Normal)   Pulse 71   Temp 98.5 F (36.9 C) (Oral)   Wt 183 lb (83 kg)   SpO2 97%   BMI 28.66 kg/m   Wt Readings from Last 3 Encounters:  05/04/17 183  lb (83 kg)  04/30/17 180 lb 3.2 oz (81.7 kg)  04/22/17 176 lb (79.8 kg)    Physical Exam  Constitutional: He appears well-developed and well-nourished. No distress.  HENT:  Head: Normocephalic and atraumatic.  Right Ear: Tympanic membrane, external ear and ear canal normal. Decreased hearing is noted.  Left Ear: Tympanic membrane, external ear and ear canal normal. Decreased hearing is noted.  Nose: Mucosal edema present. No rhinorrhea.  Mouth/Throat: Uvula is midline, oropharynx is clear and moist and mucous membranes are normal. No oropharyngeal exudate, posterior oropharyngeal edema, posterior oropharyngeal erythema or tonsillar abscesses.  HOH despite hearing aides L cerumen impaction s/p irrigation  in office today - partial clearing. Plastic curette and alligator forceps used to complete clearing. Fully removed cerumen, TM visualized and intact, pearly grey.  Eyes: Conjunctivae and EOM are normal. Pupils are equal, round, and reactive to light. No scleral icterus.  Neck: Normal range of motion. Neck supple.  Cardiovascular: Normal rate, regular rhythm, normal heart sounds and intact distal pulses.  No murmur heard. Pulmonary/Chest: Effort normal and breath sounds normal. No respiratory distress. He has no wheezes. He has no rales.  Lungs largely clear  Musculoskeletal: He exhibits edema (1-2+ bilaterally).  Lymphadenopathy:    He has no cervical adenopathy.  Skin: Skin is warm and dry. No rash noted.  Nursing note and vitals reviewed.  Results for orders placed or performed in visit on 04/30/17  CBC with Differential  Result Value Ref Range   WBC 6.1 3.8 - 10.6 K/uL   RBC 2.74 (L) 4.40 - 5.90 MIL/uL   Hemoglobin 8.4 (L) 13.0 - 18.0 g/dL   HCT 26.0 (L) 40.0 - 52.0 %   MCV 95.0 80.0 - 100.0 fL   MCH 30.6 26.0 - 34.0 pg   MCHC 32.2 32.0 - 36.0 g/dL   RDW 14.9 (H) 11.5 - 14.5 %   Platelets 78 (L) 150 - 440 K/uL   Neutrophils Relative % 80 %   Neutro Abs 4.8 1.4 - 6.5 K/uL   Lymphocytes Relative 11 %   Lymphs Abs 0.7 (L) 1.0 - 3.6 K/uL   Monocytes Relative 7 %   Monocytes Absolute 0.4 0.2 - 1.0 K/uL   Eosinophils Relative 2 %   Eosinophils Absolute 0.1 0 - 0.7 K/uL   Basophils Relative 0 %   Basophils Absolute 0.0 0 - 0.1 K/uL  Protein electrophoresis, serum  Result Value Ref Range   Total Protein ELP 6.0 6.0 - 8.5 g/dL   Albumin ELP 3.3 2.9 - 4.4 g/dL   Alpha-1-Globulin 0.2 0.0 - 0.4 g/dL   Alpha-2-Globulin 0.6 0.4 - 1.0 g/dL   Beta Globulin 0.8 0.7 - 1.3 g/dL   Gamma Globulin 1.1 0.4 - 1.8 g/dL   M-Spike, % 0.3 (H) Not Observed g/dL   SPE Interp. Comment    Comment Comment    GLOBULIN, TOTAL 2.7 2.2 - 3.9 g/dL   A/G Ratio 1.2 0.7 - 1.7  Ferritin  Result Value Ref  Range   Ferritin 69 24 - 336 ng/mL  Comprehensive metabolic panel  Result Value Ref Range   Sodium 137 135 - 145 mmol/L   Potassium 4.2 3.5 - 5.1 mmol/L   Chloride 107 101 - 111 mmol/L   CO2 23 22 - 32 mmol/L   Glucose, Bld 110 (H) 65 - 99 mg/dL   BUN 32 (H) 6 - 20 mg/dL   Creatinine, Ser 1.79 (H) 0.61 - 1.24 mg/dL   Calcium 8.4 (L) 8.9 -  10.3 mg/dL   Total Protein 6.4 (L) 6.5 - 8.1 g/dL   Albumin 3.4 (L) 3.5 - 5.0 g/dL   AST 24 15 - 41 U/L   ALT 17 17 - 63 U/L   Alkaline Phosphatase 65 38 - 126 U/L   Total Bilirubin 0.5 0.3 - 1.2 mg/dL   GFR calc non Af Amer 32 (L) >60 mL/min   GFR calc Af Amer 37 (L) >60 mL/min   Anion gap 7 5 - 15      Assessment & Plan:   Problem List Items Addressed This Visit    Acute bronchitis - Primary    This is resolving well s/p zpack treatment. Persistent nasal congestion that should continue to improve. No need for further abx treatment at this time. rec continue mucinex.       HOH (hard of hearing)    Severe. Encouraged return to audiologist. Discussing possible new hearing aides. Again reviewed no driving given marked hearing loss.  Irrigation performed today for L cerumen impaction that may be contributing to hearing loss.        Other Visit Diagnoses    Impacted cerumen of left ear           Follow up plan: Return if symptoms worsen or fail to improve.  [name], MD

## 2017-05-04 NOTE — Assessment & Plan Note (Addendum)
Severe. Encouraged return to audiologist. Discussing possible new hearing aides. Again reviewed no driving given marked hearing loss.  Irrigation performed today for L cerumen impaction that may be contributing to hearing loss.

## 2017-05-04 NOTE — Patient Instructions (Signed)
Double up on lasix 20mg  for next 3-5 days to see if any improvement in leg swelling. Keep legs elevated as able. Ear irrigation done today on left side. No more antibiotics needed, you are doing better. Continue mucinex for congestion.

## 2017-05-04 NOTE — Assessment & Plan Note (Signed)
This is resolving well s/p zpack treatment. Persistent nasal congestion that should continue to improve. No need for further abx treatment at this time. rec continue mucinex.

## 2017-05-10 ENCOUNTER — Ambulatory Visit: Payer: Medicare Other | Admitting: Family Medicine

## 2017-05-10 ENCOUNTER — Other Ambulatory Visit: Payer: Self-pay

## 2017-05-10 ENCOUNTER — Encounter: Payer: Self-pay | Admitting: Family Medicine

## 2017-05-10 VITALS — BP 140/80 | HR 59 | Temp 98.6°F | Ht 67.0 in | Wt 183.8 lb

## 2017-05-10 DIAGNOSIS — R600 Localized edema: Secondary | ICD-10-CM

## 2017-05-10 DIAGNOSIS — N184 Chronic kidney disease, stage 4 (severe): Secondary | ICD-10-CM

## 2017-05-10 DIAGNOSIS — D631 Anemia in chronic kidney disease: Secondary | ICD-10-CM | POA: Diagnosis not present

## 2017-05-10 DIAGNOSIS — Z9181 History of falling: Secondary | ICD-10-CM | POA: Diagnosis not present

## 2017-05-10 DIAGNOSIS — R0600 Dyspnea, unspecified: Secondary | ICD-10-CM | POA: Diagnosis not present

## 2017-05-10 DIAGNOSIS — E1122 Type 2 diabetes mellitus with diabetic chronic kidney disease: Secondary | ICD-10-CM | POA: Diagnosis not present

## 2017-05-10 MED ORDER — FUROSEMIDE 20 MG PO TABS: ORAL_TABLET | ORAL | 5 refills | Status: DC

## 2017-05-10 MED ORDER — TRIAMCINOLONE ACETONIDE 0.1 % EX CREA: 1.0000 "application " | TOPICAL_CREAM | Freq: Two times a day (BID) | CUTANEOUS | 0 refills | Status: DC

## 2017-05-10 NOTE — Progress Notes (Signed)
Dr. Frederico Hamman T. Reason Helzer, MD, [facility] [place], [address] Phone: [phone] Fax: [phone]  05/10/2017  Patient: Charles Rodgers, MRN: [id number], DOB: 21-Feb-1925, 84 y.o.  Primary Physician:  [name], MD   Chief Complaint  Patient presents with  . Leg Swelling  . Rash   Subjective:   Charles Rodgers is a 84 y.o. very pleasant male patient who presents with the following:  84 yo almost completely deaf patient accompanied by his brother who helps with history. He has significant deafness and can provide minimal history. He lives alone with some help coming every day.   He has had fairly dramatic LE edema in the last week, already worsened last week.   Dr. Darnell Level tried to decrease his norvasc last week and increase lasix dosing to BID - I am uncertain about compliance.   Leg swelling and rash. He also has a rash, mostly on the R LE leg.   Stop norvasc  Past Medical History, Surgical History, Social History, Family History, Problem List, Medications, and Allergies have been reviewed and updated if relevant.  Patient Active Problem List   Diagnosis Date Noted  . Acute bronchitis 04/22/2017  . Grieving 04/22/2017  . Encounter for therapeutic drug monitoring 11/24/2016  . Vitamin D deficiency 09/23/2016  . Atrial fibrillation with slow ventricular response ([place]) 10/24/2015  . Pedal edema 03/04/2015  . At risk for falls 03/04/2015  . History of malignant melanoma of skin 11/19/2014  . Melanoma of upper arm ([place]) 11/14/2014  . Monoclonal gammopathy of unknown significance ([facility]) 08/27/2014  . Advanced care planning/counseling discussion 02/09/2014  . Health maintenance examination 02/09/2014  . Elevated prostate specific antigen (PSA) 01/13/2013  . Right thyroid nodule   . Subclavian artery stenosis, right ([place])   . Medicare annual wellness visit, subsequent 05/18/2012  . Thrombocytopenia  ([place]) 05/18/2012  . Anemia in chronic kidney disease 05/18/2012  . Glaucoma   . HOH (hard of hearing)   . Enlarged prostate with lower urinary tract symptoms (LUTS)   . Cardiac murmur 09/10/2011  . Controlled type 2 diabetes mellitus with diabetic nephropathy, without long-term current use of insulin ([place])   . HTN (hypertension)   . Hyperlipidemia   . CKD stage 4 due to type 2 diabetes mellitus [facility])     Past Medical History:  Diagnosis Date  . Abscess of axilla, left 02/07/2015  . Arthritis   . BPH (benign prostatic hypertrophy)    with elevated PSA, s/p benign biopsy, followed yearly by [facility] (last seen 01/16/2013)  . CKD (chronic kidney disease) stage 3, GFR 30-59 ml/min ([place])    [place] -> 2017 requested PCP start following  . COPD (chronic obstructive pulmonary disease) ([place]) 2016   by xray  . Diabetes mellitus type 2, controlled ([place])   . Glaucoma   . Hearing loss   . HLD (hyperlipidemia)   . HTN (hypertension)   . IgM monoclonal gammopathy of uncertain significance 08/27/2014   07/2014 suggest rpt 6 mo.  . Malignant melanoma of left upper arm ([place]) 11/14/2014   s/p excision complicated by infected axillary seroma (Dr Carmin Muskrat)  . Melanoma ([place]) 2014   nose and left hand  . Secondary hyperparathyroidism ([place])   . Subclavian artery stenosis, right ([place]) 2014   found on carotid US, seen [facility], recommended recheck 1 yr at vascular lab with Dr. Delana Meyer  . Thyroid nodule 02/2013   right upper lobe -  s/p biopsy - benign follicular nodule  . Urine incontinence     Past Surgical History:  Procedure Laterality Date  . APPENDECTOMY  1938  . arm surgery     Right after trauma  . BIOPSY THYROID Right [id number]/8916   benign follicular nodule  . carotid US  07/2007   WNL  . 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 US ECHOCARDIOGRAPHY  06/2007   EF 55%, mod dil LA, mild LVH,mild pulm HTN    Social History   Socioeconomic History  .  Marital status: Widowed    Spouse name: Not on file  . Number of children: Not on file  . Years of education: Not on file  . Highest education level: Not on file  Social Needs  . Financial resource strain: Not on file  . Food insecurity - worry: Not on file  . Food insecurity - inability: Not on file  . Transportation needs - medical: Not on file  . Transportation needs - non-medical: Not on file  Occupational History  . Not on file  Tobacco Use  . Smoking status: Former Smoker    Packs/day: 0.50    Years: 15.00    Pack years: 7.50    Types: Cigarettes    Last attempt to quit: 03/31/1967    Years since quitting: 50.1  . Smokeless tobacco: Never Used  Substance and Sexual Activity  . Alcohol use: No  . Drug use: No  . Sexual activity: Not on file  Other Topics Concern  . Not on file  Social History Narrative   Caffeine: rare   Lives alone - widower 03/2017 (70+ yrs), 1 dog (grown children)   Daughter and grandson live nearby    Occupation: retired, worked Tourist information centre manager at [employer]   Edu: 5th grade   Activity: works Haematologist   Diet: some water, seldom fruits/vegetables    Family History  Problem Relation Age of Onset  . Cancer Mother        stomach  . Cancer Brother        colon  . Cancer Brother        colon  . Cancer Sister        hodgkin's lymphoma  . Cancer Brother        prostate  . Diabetes Brother   . Coronary artery disease Neg Hx   . Stroke Neg Hx     No Known Allergies  Medication list reviewed and updated in full in [place].  GEN: No fevers, chills. Nontoxic. Primarily MSK c/o today. MSK: Detailed in the HPI GI: tolerating PO intake without difficulty Neuro: No numbness, parasthesias, or tingling associated. Otherwise the pertinent positives of the ROS are noted above.   Objective:   BP 140/80   Pulse (!) 59   Temp 98.6 F (37 C) (Oral)   Ht _0  (1.702 m)   Wt 183 lb 12 oz (83.3 kg)   SpO2 98%   BMI 28.78 kg/m    GEN:  WDWN, NAD, Non-toxic, Alert & Oriented x 3 HEENT: Atraumatic, Normocephalic.  Ears and Nose: No external deformity. EXTR: No clubbing/cyanosis/3+ LE edema B, from foot to below kneecap NEURO: Normal gait.  PSYCH: Normally interactive. Conversant. Not depressed or anxious appearing.  Calm demeanor.    Radiology: No results found.  Assessment and Plan:   Bilateral leg edema  Dyspnea, unspecified type - Plan: Brain natriuretic peptide  CKD stage 4 due  to type 2 diabetes mellitus ([place])  Anemia in stage 4 chronic kidney disease (HCC)  At risk for falls  Communication with hearing and visual impairment hinder history and comprehension.   Hopefully his brother can help make these med changes happen.   F/u 2 weeks with Dr. Darnell Level to recheck  Check cardiac BNP - risk for CHF is real  Patient Instructions  Stop Amlodipine (Norvasc) - it can cause swelling.   Increase your fluid pills (Furosemide) to 2 pills in the morning and 1 pill in the early afternoon.  Cream for rash twice a day.    Meds ordered this encounter  Medications  . triamcinolone cream (KENALOG) 0.1 %    Sig: Apply 1 application topically 2 (two) times daily.    Dispense:  454 g    Refill:  0  . furosemide (LASIX) 20 MG tablet    Sig: 2 tabs po q AM and 1 tab po q each afternoon    Dispense:  90 tablet    Refill:  5   Medications Discontinued During This Encounter  Medication Reason  . amLODipine (NORVASC) 5 MG tablet   . furosemide (LASIX) 20 MG tablet Reorder   Orders Placed This Encounter  Procedures  . Brain natriuretic peptide    Signed,  Casimiro Lienhard T. Jeremy Mclamb, MD   Allergies as of 05/10/2017   No Known Allergies     Medication List        Accurate as of 05/10/17 11:59 PM. Always use your most recent med list.          apixaban 2.5 MG Tabs tablet Commonly known as:  ELIQUIS Take 1 tablet (2.5 mg total) by mouth 2 (two) times daily.   Casanthranol-Docusate Sodium 30-100 MG Caps Take by  mouth daily.   dorzolamide-timolol 22.3-6.8 MG/ML ophthalmic solution Commonly known as:  COSOPT Place 1 drop into both eyes 2 (two) times daily.   doxazosin 8 MG tablet Commonly known as:  CARDURA TAKE 1 TABLET BY MOUTH AT BEDTIME   ferrous sulfate 325 (65 FE) MG tablet TAKE 1 TABLET BY MOUTH ONCE A DAY WITH BREAKFAST   furosemide 20 MG tablet Commonly known as:  LASIX 2 tabs po q AM and 1 tab po q each afternoon   glucose blood test strip Commonly known as:  ONE TOUCH ULTRA TEST USE TO CHECK BLOOD SUGAR 3 TIMES DAILY Dx: E11.42   latanoprost 0.005 % ophthalmic solution Commonly known as:  XALATAN Place 1 drop into both eyes at bedtime.   lisinopril 20 MG tablet Commonly known as:  PRINIVIL,ZESTRIL Take 1 tablet (20 mg total) by mouth daily.   ONE TOUCH ULTRA 2 w/Device Kit   [facility] DELICA LANCETS Misc Use as directed   potassium chloride 10 MEQ tablet Commonly known as:  K-DUR TAKE 1 TABLET BY MOUTH DAILY AS NEEDED   pravastatin 40 MG tablet Commonly known as:  PRAVACHOL TAKE 1 TABLET BY MOUTH AT BEDTIME   PRESERVISION AREDS Caps Take 2 capsules by mouth daily.   triamcinolone cream 0.1 % Commonly known as:  KENALOG Apply 1 application topically 2 (two) times daily.   Vitamin D 2000 units Caps Take 1 capsule (2,000 Units total) by mouth daily.

## 2017-05-10 NOTE — Patient Instructions (Addendum)
Stop Amlodipine (Norvasc) - it can cause swelling.   Increase your fluid pills (Furosemide) to 2 pills in the morning and 1 pill in the early afternoon.  Cream for rash twice a day.

## 2017-05-11 LAB — BRAIN NATRIURETIC PEPTIDE: BRAIN NATRIURETIC PEPTIDE: 481 pg/mL — AB (ref <100)

## 2017-05-14 ENCOUNTER — Inpatient Hospital Stay: Payer: Medicare Other

## 2017-05-14 ENCOUNTER — Other Ambulatory Visit: Payer: Self-pay | Admitting: Family Medicine

## 2017-05-14 ENCOUNTER — Telehealth: Payer: Self-pay | Admitting: Family Medicine

## 2017-05-14 VITALS — BP 198/85 | HR 72

## 2017-05-14 DIAGNOSIS — D631 Anemia in chronic kidney disease: Secondary | ICD-10-CM

## 2017-05-14 DIAGNOSIS — N183 Chronic kidney disease, stage 3 (moderate): Secondary | ICD-10-CM | POA: Diagnosis not present

## 2017-05-14 DIAGNOSIS — I129 Hypertensive chronic kidney disease with stage 1 through stage 4 chronic kidney disease, or unspecified chronic kidney disease: Secondary | ICD-10-CM | POA: Diagnosis not present

## 2017-05-14 DIAGNOSIS — N184 Chronic kidney disease, stage 4 (severe): Principal | ICD-10-CM

## 2017-05-14 LAB — HEMOGLOBIN: Hemoglobin: 7.7 g/dL — ABNORMAL LOW (ref 13.0–18.0)

## 2017-05-14 MED ORDER — EPOETIN ALFA 10000 UNIT/ML IJ SOLN
10000.0000 [IU] | Freq: Once | INTRAMUSCULAR | Status: AC
  Administered 2017-05-14: 10000 [IU] via SUBCUTANEOUS

## 2017-05-14 NOTE — Telephone Encounter (Signed)
Labs have not been resulted but no documentation from provider. pls advise

## 2017-05-14 NOTE — Progress Notes (Signed)
Doran Durand, NP approves to go ahead with procrit injection today.  Blood pressure is 162/68 then recheck is 166/67.

## 2017-05-14 NOTE — Telephone Encounter (Signed)
Copied from [place]. >> May 14, 2017 11:55 AM Margot Ables wrote: Pt brother checking on lab results from 05/10/17, saw Dr. Lorelei Pont

## 2017-05-17 ENCOUNTER — Telehealth: Payer: Self-pay | Admitting: Family Medicine

## 2017-05-17 DIAGNOSIS — R600 Localized edema: Secondary | ICD-10-CM

## 2017-05-17 DIAGNOSIS — Z85820 Personal history of malignant melanoma of skin: Secondary | ICD-10-CM | POA: Diagnosis not present

## 2017-05-17 DIAGNOSIS — L578 Other skin changes due to chronic exposure to nonionizing radiation: Secondary | ICD-10-CM | POA: Diagnosis not present

## 2017-05-17 DIAGNOSIS — L570 Actinic keratosis: Secondary | ICD-10-CM | POA: Diagnosis not present

## 2017-05-17 DIAGNOSIS — I8310 Varicose veins of unspecified lower extremity with inflammation: Secondary | ICD-10-CM | POA: Diagnosis not present

## 2017-05-17 NOTE — Telephone Encounter (Signed)
Ok, 15 mm Hg compression stockings Re: lower extremity edema

## 2017-05-17 NOTE — Telephone Encounter (Signed)
Pt seen 05/10/17.

## 2017-05-17 NOTE — Telephone Encounter (Signed)
Left message for Charles Rodgers that prescription for compression stockings is ready to be picked up at the front desk.

## 2017-05-17 NOTE — Telephone Encounter (Signed)
Copied from [place]. Topic: Inquiry >> May 17, 2017  2:42 PM Margot Ables wrote: Reason for CRM: pt needing RX for compression stockings - he/brother will pick up. Please advise pt/brother

## 2017-05-17 NOTE — Telephone Encounter (Signed)
I called several times with no answer.

## 2017-05-24 ENCOUNTER — Encounter: Payer: Self-pay | Admitting: Family Medicine

## 2017-05-24 ENCOUNTER — Ambulatory Visit (INDEPENDENT_AMBULATORY_CARE_PROVIDER_SITE_OTHER): Payer: Medicare Other | Admitting: Family Medicine

## 2017-05-24 VITALS — BP 160/80 | HR 63 | Temp 97.8°F | Wt 184.0 lb

## 2017-05-24 DIAGNOSIS — H9193 Unspecified hearing loss, bilateral: Secondary | ICD-10-CM | POA: Diagnosis not present

## 2017-05-24 DIAGNOSIS — R600 Localized edema: Secondary | ICD-10-CM | POA: Diagnosis not present

## 2017-05-24 DIAGNOSIS — I4891 Unspecified atrial fibrillation: Secondary | ICD-10-CM

## 2017-05-24 DIAGNOSIS — R296 Repeated falls: Secondary | ICD-10-CM

## 2017-05-24 DIAGNOSIS — N184 Chronic kidney disease, stage 4 (severe): Secondary | ICD-10-CM | POA: Diagnosis not present

## 2017-05-24 DIAGNOSIS — N2889 Other specified disorders of kidney and ureter: Secondary | ICD-10-CM | POA: Diagnosis not present

## 2017-05-24 DIAGNOSIS — I151 Hypertension secondary to other renal disorders: Secondary | ICD-10-CM | POA: Diagnosis not present

## 2017-05-24 DIAGNOSIS — E1122 Type 2 diabetes mellitus with diabetic chronic kidney disease: Secondary | ICD-10-CM | POA: Diagnosis not present

## 2017-05-24 LAB — POC URINALSYSI DIPSTICK (AUTOMATED)
BILIRUBIN UA: NEGATIVE
Blood, UA: NEGATIVE
Glucose, UA: NEGATIVE
KETONES UA: NEGATIVE
LEUKOCYTES UA: NEGATIVE
Nitrite, UA: NEGATIVE
Protein, UA: NEGATIVE
Spec Grav, UA: 1.015 (ref 1.010–1.025)
Urobilinogen, UA: 0.2 E.U./dL
pH, UA: 6 (ref 5.0–8.0)

## 2017-05-24 MED ORDER — FUROSEMIDE 20 MG PO TABS: 40.0000 mg | ORAL_TABLET | Freq: Two times a day (BID) | ORAL | 3 refills | Status: DC

## 2017-05-24 MED ORDER — LISINOPRIL 20 MG PO TABS: 20.0000 mg | ORAL_TABLET | Freq: Two times a day (BID) | ORAL | 1 refills | Status: AC

## 2017-05-24 NOTE — Patient Instructions (Addendum)
No driving because you cannot hear well Use compression stockings - put on in the morning, take off at bedtime.  Stay off amlodipine.  Increase lasix to 2 in the morning, 2 in early afternoon.  Increase lisinopril 20mg  to twice daily.  Return in 1 week for lab visit only. Return in 3 weeks for follow up visit.  Let us know if any questions or concerns.  Urinalysis today.

## 2017-05-24 NOTE — Assessment & Plan Note (Signed)
Increase lasix and lisinopril for better BP control, check Cr in 1 wk.

## 2017-05-24 NOTE — Assessment & Plan Note (Signed)
Chronic, deteriorated off amlodipine. Will increase lisinopril to 20mg  bid, increase lasix. RTC 1 wk recheck kidney function for close monitoring.

## 2017-05-24 NOTE — Assessment & Plan Note (Addendum)
Worsening despite stopping amlodipine 2 wks ago. Consider restarting at low dose. Will in interim recommend increased lasix to 40mg  bid, lisinopril 20mg  bid, and encourage regular use of low strength compression stockings. Check UA today r/o proteinuria - clear.  RTC 1 wk labs, RTC 3 wks f/u visit.

## 2017-05-24 NOTE — Assessment & Plan Note (Signed)
Continue to recommend no driving.

## 2017-05-24 NOTE — Assessment & Plan Note (Signed)
Encouraged regular walker and cane use.

## 2017-05-24 NOTE — Addendum Note (Signed)
Addended by: Brenton Grills on: 9/32/3557 32:20 PM   Modules accepted: Orders

## 2017-05-24 NOTE — Progress Notes (Signed)
BP (!) 160/80 (BP Location: Right Arm, Cuff Size: Normal)   Pulse 63   Temp 97.8 F (36.6 C) (Oral)   Wt 184 lb (83.5 kg)   SpO2 98%   BMI 28.82 kg/m    CC: 2 wk f/u visit Subjective:    Patient ID: Charles Rodgers, male    DOB: 07-Oct-1924, 84 y.o.   MRN: [id number]  HPI: Kumar Falwell is a 84 y.o. male presenting on 05/24/2017 for Leg Swelling (Here for 2 wk follow-up. Still has bilateral LE edema, right is worse.  Says swelling goes down at night. Pt accompained by brother, [name], who would like Dr. Darnell Level to recommend that pt does not need to drive. But without pt knowing suggestion from brother. ) and Fall (Per pt's brother, pt has fallen 2x since last OV, 05/10/17. )   See recent notes for details. Concern for increased pedal edema. Last visit we stopped amlodipine and increased lasix to 55m in am and 280min pm. He has been taking this consistently, however no significant improvement noted.  Rx compression stockings 1525m - has not been using.  Denies dyspnea or chest pain.  Has had 2 falls in the last few weeks - in driveway and in his house. He does not use walker or cane regularly. Hit back of head with a fall. No LOC, no residual headache.   Relevant past medical, surgical, family and social history reviewed and updated as indicated. Interim medical history since our last visit reviewed. Allergies and medications reviewed and updated. Outpatient Medications Prior to Visit  Medication Sig Dispense Refill  . apixaban (ELIQUIS) 2.5 MG TABS tablet Take 1 tablet (2.5 mg total) by mouth 2 (two) times daily. 60 tablet 6  . Blood Glucose Monitoring Suppl (ONE TOUCH ULTRA 2) W/DEVICE KIT     . Casanthranol-Docusate Sodium 30-100 MG CAPS Take by mouth daily.    . Cholecalciferol (VITAMIN D) 2000 units CAPS Take 1 capsule (2,000 Units total) by mouth daily. 30 capsule   . dorzolamide-timolol (COSOPT) 22.3-6.8 MG/ML ophthalmic solution Place 1 drop into both eyes 2 (two)  times daily. 10 mL   . doxazosin (CARDURA) 8 MG tablet TAKE 1 TABLET BY MOUTH AT BEDTIME 90 tablet 1  . ferrous sulfate 325 (65 FE) MG tablet TAKE 1 TABLET BY MOUTH ONCE A DAY WITH BREAKFAST 90 tablet 0  . glucose blood (ONE TOUCH ULTRA TEST) test strip USE TO CHECK BLOOD SUGAR 3 TIMES DAILY Dx: E11.42 300 each 2  . latanoprost (XALATAN) 0.005 % ophthalmic solution Place 1 drop into both eyes at bedtime. 2.5 mL   . Multiple Vitamins-Minerals (PRESERVISION AREDS) CAPS Take 2 capsules by mouth daily.    . OGlory RosebushLICA LANCETS MISC Use as directed    . potassium chloride (K-DUR) 10 MEQ tablet TAKE 1 TABLET BY MOUTH DAILY AS NEEDED 30 tablet 3  . pravastatin (PRAVACHOL) 40 MG tablet TAKE 1 TABLET BY MOUTH AT BEDTIME 90 tablet 1  . triamcinolone cream (KENALOG) 0.1 % Apply 1 application topically 2 (two) times daily. 454 g 0  . furosemide (LASIX) 20 MG tablet 2 tabs po q AM and 1 tab po q each afternoon 90 tablet 5  . lisinopril (PRINIVIL,ZESTRIL) 20 MG tablet Take 1 tablet (20 mg total) by mouth daily. 90 tablet 1   No facility-administered medications prior to visit.      Per HPI unless specifically indicated in ROS section below Review of Systems  Objective:    BP (!) 160/80 (BP Location: Right Arm, Cuff Size: Normal)   Pulse 63   Temp 97.8 F (36.6 C) (Oral)   Wt 184 lb (83.5 kg)   SpO2 98%   BMI 28.82 kg/m   Wt Readings from Last 3 Encounters:  05/24/17 184 lb (83.5 kg)  05/10/17 183 lb 12 oz (83.3 kg)  05/04/17 183 lb (83 kg)    Physical Exam  Constitutional: He appears well-developed and well-nourished. No distress.  Here with cane today  HENT:  Head: Normocephalic and atraumatic.  Mouth/Throat: Oropharynx is clear and moist. No oropharyngeal exudate.  No abrasion or apparent hematoma to posterior scalp  Cardiovascular: Normal rate, regular rhythm, normal heart sounds and intact distal pulses.  No murmur heard. Pulmonary/Chest: Effort normal and breath sounds  normal. No respiratory distress. He has no wheezes. He has no rales.  Crackles bibasilarly  Musculoskeletal: He exhibits edema (3+ pitting bilaterally to knees).  Skin: Skin is warm and dry. No rash noted.  Psychiatric: He has a normal mood and affect.  Nursing note and vitals reviewed.  Results for orders placed or performed in visit on 05/14/17  Hemoglobin [facility])  Result Value Ref Range   Hemoglobin 7.7 (L) 13.0 - 18.0 g/dL   Lab Results  Component Value Date   TSH 1.800 08/17/2016       Assessment & Plan:   Problem List Items Addressed This Visit    Atrial fibrillation with slow ventricular response ([place])    Continue eliquis. Pulse rate limits beta blocker      Relevant Medications   lisinopril (PRINIVIL,ZESTRIL) 20 MG tablet   furosemide (LASIX) 20 MG tablet   CKD stage 4 due to type 2 diabetes mellitus (HCC)    Increase lasix and lisinopril for better BP control, check Cr in 1 wk.      Relevant Medications   lisinopril (PRINIVIL,ZESTRIL) 20 MG tablet   Other Relevant Orders   Renal function panel   CBC with Differential/Platelet   HOH (hard of hearing)    Continue to recommend no driving.       HTN (hypertension)    Chronic, deteriorated off amlodipine. Will increase lisinopril to 77m bid, increase lasix. RTC 1 wk recheck kidney function for close monitoring.       Relevant Medications   lisinopril (PRINIVIL,ZESTRIL) 20 MG tablet   furosemide (LASIX) 20 MG tablet   Pedal edema - Primary    Worsening despite stopping amlodipine 2 wks ago. Consider restarting at low dose. Will in interim recommend increased lasix to 487mbid, lisinopril 207mid, and encourage regular use of low strength compression stockings. Check UA today r/o proteinuria - clear.  RTC 1 wk labs, RTC 3 wks f/u visit.       Recurrent falls    Encouraged regular walker and cane use.           Meds ordered this encounter  Medications  . lisinopril (PRINIVIL,ZESTRIL) 20 MG tablet     Sig: Take 1 tablet (20 mg total) by mouth 2 (two) times daily.    Dispense:  180 tablet    Refill:  1  . furosemide (LASIX) 20 MG tablet    Sig: Take 2 tablets (40 mg total) by mouth 2 (two) times daily. 2 in the morning, 2 in the early afternoon    Dispense:  120 tablet    Refill:  3   Orders Placed This Encounter  Procedures  . Renal function panel  Standing Status:   Future    Standing Expiration Date:   05/24/2018  . CBC with Differential/Platelet    Standing Status:   Future    Standing Expiration Date:   05/24/2018    Follow up plan: Return in about 3 weeks (around 06/14/2017) for follow up visit.  [name], MD

## 2017-05-24 NOTE — Assessment & Plan Note (Signed)
Continue eliquis. Pulse rate limits beta blocker

## 2017-05-28 ENCOUNTER — Inpatient Hospital Stay: Payer: Medicare Other | Attending: Hematology and Oncology

## 2017-05-28 ENCOUNTER — Inpatient Hospital Stay: Payer: Medicare Other

## 2017-05-28 DIAGNOSIS — I129 Hypertensive chronic kidney disease with stage 1 through stage 4 chronic kidney disease, or unspecified chronic kidney disease: Secondary | ICD-10-CM | POA: Diagnosis present

## 2017-05-28 DIAGNOSIS — N184 Chronic kidney disease, stage 4 (severe): Secondary | ICD-10-CM

## 2017-05-28 DIAGNOSIS — D631 Anemia in chronic kidney disease: Secondary | ICD-10-CM | POA: Insufficient documentation

## 2017-05-28 DIAGNOSIS — N189 Chronic kidney disease, unspecified: Secondary | ICD-10-CM | POA: Insufficient documentation

## 2017-05-28 DIAGNOSIS — N183 Chronic kidney disease, stage 3 (moderate): Secondary | ICD-10-CM | POA: Diagnosis present

## 2017-05-28 LAB — HEMOGLOBIN: Hemoglobin: 8.1 g/dL — ABNORMAL LOW (ref 13.0–18.0)

## 2017-05-28 NOTE — Progress Notes (Signed)
Patient's bp is elevated and not able to receive Procrit injection today.  Informed Gaspar Bidding, NP and he advises:   Patient and person with him were informed.

## 2017-05-31 ENCOUNTER — Other Ambulatory Visit: Payer: Medicare Other

## 2017-06-01 ENCOUNTER — Other Ambulatory Visit (INDEPENDENT_AMBULATORY_CARE_PROVIDER_SITE_OTHER): Payer: Medicare Other

## 2017-06-01 ENCOUNTER — Ambulatory Visit (INDEPENDENT_AMBULATORY_CARE_PROVIDER_SITE_OTHER): Payer: Medicare Other | Admitting: Family Medicine

## 2017-06-01 ENCOUNTER — Telehealth: Payer: Self-pay

## 2017-06-01 ENCOUNTER — Encounter: Payer: Self-pay | Admitting: Family Medicine

## 2017-06-01 VITALS — BP 180/70 | HR 68 | Temp 97.8°F | Wt 180.2 lb

## 2017-06-01 DIAGNOSIS — I151 Hypertension secondary to other renal disorders: Secondary | ICD-10-CM | POA: Diagnosis not present

## 2017-06-01 DIAGNOSIS — N184 Chronic kidney disease, stage 4 (severe): Secondary | ICD-10-CM | POA: Diagnosis not present

## 2017-06-01 DIAGNOSIS — E1122 Type 2 diabetes mellitus with diabetic chronic kidney disease: Secondary | ICD-10-CM

## 2017-06-01 DIAGNOSIS — N2889 Other specified disorders of kidney and ureter: Secondary | ICD-10-CM | POA: Diagnosis not present

## 2017-06-01 LAB — RENAL FUNCTION PANEL
ALBUMIN: 3.5 g/dL (ref 3.5–5.2)
BUN: 55 mg/dL — AB (ref 6–23)
CO2: 29 meq/L (ref 19–32)
Calcium: 9.3 mg/dL (ref 8.4–10.5)
Chloride: 103 mEq/L (ref 96–112)
Creatinine, Ser: 1.77 mg/dL — ABNORMAL HIGH (ref 0.40–1.50)
GFR: 38.55 mL/min — ABNORMAL LOW (ref >60.00)
Glucose, Bld: 118 mg/dL — ABNORMAL HIGH (ref 70–99)
Phosphorus: 3.8 mg/dL (ref 2.3–4.6)
Potassium: 4.1 mEq/L (ref 3.5–5.1)
Sodium: 138 mEq/L (ref 135–145)

## 2017-06-01 NOTE — Progress Notes (Signed)
BP (!) 180/70 (BP Location: Right Arm, Cuff Size: Normal)   Pulse 68   Temp 97.8 F (36.6 C) (Oral)   Wt 180 lb 4 oz (81.8 kg)   SpO2 98%   BMI 28.23 kg/m    CC: BP check Subjective:    Patient ID: Charles Rodgers, male    DOB: 1924/10/31, 84 y.o.   MRN: [id number]  HPI: Charles Rodgers is a 84 y.o. male presenting on 06/01/2017 for Hypertension (Check BP and discuss meds. Pt's brother says he needs lab work done.)   See prior note for details.  Last visit we increased lasix 61m bid and lisinopril 220mbid for better bp control - he endorses compliance with this as well as cardura 60m66mightly. Could not wear compression stockings.   Could not get procrit due to elevated blood pressures (194/51).  Denies HA, vision changes, CP/tightness, SOB, leg swelling.   Relevant past medical, surgical, family and social history reviewed and updated as indicated. Interim medical history since our last visit reviewed. Allergies and medications reviewed and updated. Outpatient Medications Prior to Visit  Medication Sig Dispense Refill  . apixaban (ELIQUIS) 2.5 MG TABS tablet Take 1 tablet (2.5 mg total) by mouth 2 (two) times daily. 60 tablet 6  . Blood Glucose Monitoring Suppl (ONE TOUCH ULTRA 2) W/DEVICE KIT     . Casanthranol-Docusate Sodium 30-100 MG CAPS Take by mouth daily.    . Cholecalciferol (VITAMIN D) 2000 units CAPS Take 1 capsule (2,000 Units total) by mouth daily. 30 capsule   . dorzolamide-timolol (COSOPT) 22.3-6.8 MG/ML ophthalmic solution Place 1 drop into both eyes 2 (two) times daily. 10 mL   . doxazosin (CARDURA) 8 MG tablet TAKE 1 TABLET BY MOUTH AT BEDTIME 90 tablet 1  . ferrous sulfate 325 (65 FE) MG tablet TAKE 1 TABLET BY MOUTH ONCE A DAY WITH BREAKFAST 90 tablet 0  . furosemide (LASIX) 20 MG tablet Take 2 tablets (40 mg total) by mouth 2 (two) times daily. 2 in the morning, 2 in the early afternoon 120 tablet 3  . glucose blood (ONE TOUCH ULTRA TEST) test  strip USE TO CHECK BLOOD SUGAR 3 TIMES DAILY Dx: E11.42 300 each 2  . latanoprost (XALATAN) 0.005 % ophthalmic solution Place 1 drop into both eyes at bedtime. 2.5 mL   . lisinopril (PRINIVIL,ZESTRIL) 20 MG tablet Take 1 tablet (20 mg total) by mouth 2 (two) times daily. 180 tablet 1  . Multiple Vitamins-Minerals (PRESERVISION AREDS) CAPS Take 2 capsules by mouth daily.    . OGlory RosebushLICA LANCETS MISC Use as directed    . potassium chloride (K-DUR) 10 MEQ tablet TAKE 1 TABLET BY MOUTH DAILY AS NEEDED 30 tablet 3  . pravastatin (PRAVACHOL) 40 MG tablet TAKE 1 TABLET BY MOUTH AT BEDTIME 90 tablet 1  . triamcinolone cream (KENALOG) 0.1 % Apply 1 application topically 2 (two) times daily. 454 g 0   No facility-administered medications prior to visit.      Per HPI unless specifically indicated in ROS section below Review of Systems     Objective:    BP (!) 180/70 (BP Location: Right Arm, Cuff Size: Normal)   Pulse 68   Temp 97.8 F (36.6 C) (Oral)   Wt 180 lb 4 oz (81.8 kg)   SpO2 98%   BMI 28.23 kg/m   Wt Readings from Last 3 Encounters:  06/01/17 180 lb 4 oz (81.8 kg)  05/24/17 184 lb (83.5 kg)  05/10/17  183 lb 12 oz (83.3 kg)    Physical Exam  Constitutional: He appears well-developed and well-nourished. No distress.  HENT:  Mouth/Throat: Oropharynx is clear and moist.  Hard of hearing  Neck: No thyromegaly present.  Cardiovascular: Normal rate, regular rhythm, normal heart sounds and intact distal pulses.  No murmur heard. Pulmonary/Chest: Effort normal and breath sounds normal. No respiratory distress. He has no wheezes. He has no rales.  Musculoskeletal: He exhibits edema (1+ pitting).  Skin: Skin is warm and dry. No rash noted.  Nursing note and vitals reviewed.  Results for orders placed or performed in visit on 05/28/17  Hemoglobin [facility])  Result Value Ref Range   Hemoglobin 8.1 (L) 13.0 - 18.0 g/dL   Lab Results  Component Value Date   CREATININE 1.79 (H)  04/30/2017   BUN 32 (H) 04/30/2017   NA 137 04/30/2017   K 4.2 04/30/2017   CL 107 04/30/2017   CO2 23 04/30/2017    Lab Results  Component Value Date   TSH 1.800 08/17/2016       Assessment & Plan:   Problem List Items Addressed This Visit    CKD stage 4 due to type 2 diabetes mellitus ([place])    Update renal panel today.      Relevant Orders   Renal function panel   HTN (hypertension) - Primary    Chronic, deteriorated despite current regimen. Pt states he is taking medications as prescribed. Will check renal panel then decide on additional antihypertensive. Reviewed importance of limiting sodium intake. He did not tolerate compression stockings - encouraged using knee high compression socks during the day. Pt agrees with plan.           No orders of the defined types were placed in this encounter.  Orders Placed This Encounter  Procedures  . Renal function panel    Follow up plan: No Follow-up on file.  [name], MD

## 2017-06-01 NOTE — Patient Instructions (Addendum)
Buy compression socks that are knee high.  Keep legs up as much as you can during the day. Limit salt - don't add to food.  Blood pressure regimen is lisinopril 20mg  twice daily, lasix 40mg  twice daily, cardura 8mg  at bedtime.  Labs today - we will be in touch with change in blood pressure regimen based on blood work results, but we may be starting a 4th blood pressure medicine.

## 2017-06-01 NOTE — Telephone Encounter (Signed)
Don't recommend driving due to hearing loss.

## 2017-06-01 NOTE — Assessment & Plan Note (Signed)
Update renal panel today.  

## 2017-06-01 NOTE — Assessment & Plan Note (Addendum)
Chronic, deteriorated despite current regimen. Pt states he is taking medications as prescribed. Will check renal panel then decide on additional antihypertensive. Reviewed importance of limiting sodium intake. He did not tolerate compression stockings - encouraged using knee high compression socks during the day. Pt agrees with plan.

## 2017-06-01 NOTE — Telephone Encounter (Signed)
Copied from [place] [id number]. Topic: Inquiry >> Jun 01, 2017  4:36 PM Cecelia Byars, NT wrote: Reason for CRM: Patient wanted to know if he can still drive a little bit  please call him at [phone] to let him know

## 2017-06-02 NOTE — Telephone Encounter (Signed)
Spoke with pt relaying message per Dr. G. Pt verbalizes understanding. 

## 2017-06-04 ENCOUNTER — Other Ambulatory Visit: Payer: Self-pay | Admitting: Family Medicine

## 2017-06-04 ENCOUNTER — Telehealth: Payer: Self-pay

## 2017-06-04 MED ORDER — AMLODIPINE BESYLATE 5 MG PO TABS: 5.0000 mg | ORAL_TABLET | Freq: Every day | ORAL | 3 refills | Status: DC

## 2017-06-04 NOTE — Telephone Encounter (Signed)
See lab results. thanks

## 2017-06-04 NOTE — Telephone Encounter (Signed)
Spoke with pt's brother, [name] (on dpr).  See 06/01/17 Result Note.

## 2017-06-04 NOTE — Telephone Encounter (Signed)
Copied from [place] [id number]. Topic: Inquiry >> Jun 04, 2017  1:45 PM Conception Chancy, NT wrote: Patient brother [name] is calling to get the results from the labs that were drawn.

## 2017-06-11 ENCOUNTER — Ambulatory Visit: Payer: Self-pay | Admitting: *Deleted

## 2017-06-11 ENCOUNTER — Inpatient Hospital Stay: Payer: Medicare Other

## 2017-06-11 DIAGNOSIS — D631 Anemia in chronic kidney disease: Secondary | ICD-10-CM

## 2017-06-11 DIAGNOSIS — N184 Chronic kidney disease, stage 4 (severe): Principal | ICD-10-CM

## 2017-06-11 DIAGNOSIS — N189 Chronic kidney disease, unspecified: Secondary | ICD-10-CM | POA: Diagnosis not present

## 2017-06-11 LAB — HEMOGLOBIN: Hemoglobin: 8.1 g/dL — ABNORMAL LOW (ref 13.0–18.0)

## 2017-06-11 MED ORDER — AMLODIPINE BESYLATE 5 MG PO TABS
10.0000 mg | ORAL_TABLET | Freq: Every day | ORAL | Status: DC
Start: 2017-06-11 — End: 2017-06-14

## 2017-06-11 NOTE — Telephone Encounter (Addendum)
I spoke with Charles Rodgers (DPR signed); does not want to take pt to [facility]. I advised per Dr Darnell Level to increase amlodipine to 10 mg daily(updated med list); and keep appt on 06/14/17 at 12 noon. If pt condition changes or worsens prior to appt take pt to ED. Charles Rodgers voiced understanding and will bring all of pts meds with him on 06/14/17 for appt.  Charles Rodgers said pt took Amlodipine 5 mg around 2 pm today and I advised OK to give another Amlodipine 5 mg now. Charles Rodgers voiced understanding. Charles Rodgers also trying to get BP cuff for home. FYI to Dr Darnell Level.

## 2017-06-11 NOTE — Progress Notes (Signed)
Doran Durand, NP informed of patient's elevated BP today.  Gaspar Bidding advises no Procrit today and he is going to send Mr. Merolla's PCP a message to let them know about BP.

## 2017-06-11 NOTE — Addendum Note (Signed)
Addended by: Helene Shoe on: 06/11/2017 04:24 PM   Modules accepted: Orders

## 2017-06-11 NOTE — Telephone Encounter (Signed)
Pt's brother called to reports pt's B/P at 71 today at [facility] was 191/90  and 196/90. Pt was denied Procrit infusion.  Pt does not have home monitor to recheck B/P. Pt's brother states he is unsure if pt is taking medications as ordered as  "He won't let anyone else mess with them."  Denies headache, dizziness, no one sided weakness or changes, difficulty with speech. Awaiting response from [facility] for possibility of appt. today. Pt does have appt Monday with Dr. Danise Mina. # [id number].  Reason for Disposition . Systolic BP  >= 329 OR Diastolic >= 924  Answer Assessment - Initial Assessment Questions 1. BLOOD PRESSURE: "What is the blood pressure?" "Did you take at least two measurements 5 minutes apart?"     191/90    Then 196/90 2. ONSET: "When did you take your blood pressure?"     1230 at [facility]; pt denied Procrit infusion 3. HOW: "How did you obtain the blood pressure?" (e.g., visiting nurse, automatic home BP monitor)     At [facility] 4. HISTORY: "Do you have a history of high blood pressure?"     Yes 5. MEDICATIONS: "Are you taking any medications for blood pressure?" "Have you missed any doses recently?"     Pt's brother unsure 6. OTHER SYMPTOMS: "Do you have any symptoms?" (e.g., headache, chest pain, blurred vision, difficulty breathing, weakness)     No  Protocols used: HIGH BLOOD PRESSURE-A-AH

## 2017-06-11 NOTE — Progress Notes (Unsigned)
Message sent to Dr. Danise Mina about patient's blood pressure. MD made aware that last dose of Procrit had to be held due to BP and that patient was to contact him. I have concerns that the patient did not remember to call his physician. His BP today was 197/84. While his Hgb is 8.1, we will plan on holding his dose today as well. I asked PCP to contact patient about his blood pressure. If we are able to get his SBP down </= 150, we will be able to proceed with his injections. Awaiting response. Patient and infusion aware.

## 2017-06-14 ENCOUNTER — Encounter: Payer: Self-pay | Admitting: Family Medicine

## 2017-06-14 ENCOUNTER — Ambulatory Visit (INDEPENDENT_AMBULATORY_CARE_PROVIDER_SITE_OTHER): Payer: Medicare Other | Admitting: Family Medicine

## 2017-06-14 VITALS — BP 166/68 | HR 65 | Temp 98.3°F | Wt 181.0 lb

## 2017-06-14 DIAGNOSIS — E1122 Type 2 diabetes mellitus with diabetic chronic kidney disease: Secondary | ICD-10-CM | POA: Diagnosis not present

## 2017-06-14 DIAGNOSIS — I151 Hypertension secondary to other renal disorders: Secondary | ICD-10-CM

## 2017-06-14 DIAGNOSIS — N184 Chronic kidney disease, stage 4 (severe): Secondary | ICD-10-CM | POA: Diagnosis not present

## 2017-06-14 DIAGNOSIS — R600 Localized edema: Secondary | ICD-10-CM

## 2017-06-14 DIAGNOSIS — H9193 Unspecified hearing loss, bilateral: Secondary | ICD-10-CM

## 2017-06-14 DIAGNOSIS — I4891 Unspecified atrial fibrillation: Secondary | ICD-10-CM | POA: Diagnosis not present

## 2017-06-14 DIAGNOSIS — N2889 Other specified disorders of kidney and ureter: Secondary | ICD-10-CM

## 2017-06-14 DIAGNOSIS — R011 Cardiac murmur, unspecified: Secondary | ICD-10-CM | POA: Diagnosis not present

## 2017-06-14 MED ORDER — VITAMIN D 50 MCG (2000 UT) PO CAPS: 1.0000 | ORAL_CAPSULE | Freq: Every day | ORAL | Status: DC

## 2017-06-14 MED ORDER — FUROSEMIDE 40 MG PO TABS: 40.0000 mg | ORAL_TABLET | Freq: Two times a day (BID) | ORAL | 3 refills | Status: DC

## 2017-06-14 MED ORDER — FUROSEMIDE 20 MG PO TABS: 40.0000 mg | ORAL_TABLET | Freq: Two times a day (BID) | ORAL | 3 refills | Status: DC

## 2017-06-14 MED ORDER — AMLODIPINE BESYLATE 10 MG PO TABS: 10.0000 mg | ORAL_TABLET | Freq: Every day | ORAL | 3 refills | Status: AC

## 2017-06-14 MED ORDER — SPIRONOLACTONE 25 MG PO TABS: 12.5000 mg | ORAL_TABLET | ORAL | 3 refills | Status: DC

## 2017-06-14 NOTE — Progress Notes (Signed)
BP (!) 166/68 (BP Location: Left Arm, Cuff Size: Normal)   Pulse 65   Temp 98.3 F (36.8 C) (Oral)   Wt 181 lb (82.1 kg)   SpO2 97%   BMI 28.35 kg/m   Standing - 178/60 - orthostatics negative.  CC: f/u HTN Subjective:    Patient ID: Charles Rodgers, male    DOB: 15-Sep-1924, 84 y.o.   MRN: [id number]  HPI: Charles Rodgers is a 84 y.o. male presenting on 06/14/2017 for 3 wk follow-up (Has increased furosemide to 2 tabs BID. Pt is accompanied by brother, Charles Rodgers.  Asking if pt's oncologists spoke with Dr. Darnell Level about pt's BP. )   See recent notes for visits. Here with brother Charles Rodgers. Last 2 infusion appointments has not been able to receive procrit due to elevated blood pressures (190/80s).  Over the weekend we increased amlodipine to 61m daily, and continued his lasix 434mbid, lisinopril 2058mid, and cardura 8mg57mily. Unable to wear compression stockings.   Denies dyspnea, chest pain or cough. Low energy, leg swelling.   Denies HA, vision changes, CP/tightness, SOB, leg swelling.   Relevant past medical, surgical, family and social history reviewed and updated as indicated. Interim medical history since our last visit reviewed. Allergies and medications reviewed and updated. Outpatient Medications Prior to Visit  Medication Sig Dispense Refill  . apixaban (ELIQUIS) 2.5 MG TABS tablet Take 1 tablet (2.5 mg total) by mouth 2 (two) times daily. 60 tablet 6  . Blood Glucose Monitoring Suppl (ONE TOUCH ULTRA 2) W/DEVICE KIT     . Casanthranol-Docusate Sodium 30-100 MG CAPS Take by mouth daily.    . dorzolamide-timolol (COSOPT) 22.3-6.8 MG/ML ophthalmic solution Place 1 drop into both eyes 2 (two) times daily. 10 mL   . doxazosin (CARDURA) 8 MG tablet TAKE 1 TABLET BY MOUTH AT BEDTIME 90 tablet 1  . ferrous sulfate 325 (65 FE) MG tablet TAKE 1 TABLET BY MOUTH ONCE A DAY WITH BREAKFAST 90 tablet 0  . glucose blood (ONE TOUCH ULTRA TEST) test strip USE TO CHECK BLOOD  SUGAR 3 TIMES DAILY Dx: E11.42 300 each 2  . latanoprost (XALATAN) 0.005 % ophthalmic solution Place 1 drop into both eyes at bedtime. 2.5 mL   . lisinopril (PRINIVIL,ZESTRIL) 20 MG tablet Take 1 tablet (20 mg total) by mouth 2 (two) times daily. 180 tablet 1  . Multiple Vitamins-Minerals (PRESERVISION AREDS) CAPS Take 2 capsules by mouth daily.    . ONGlory RosebushICA LANCETS MISC Use as directed    . pravastatin (PRAVACHOL) 40 MG tablet TAKE 1 TABLET BY MOUTH AT BEDTIME 90 tablet 1  . triamcinolone cream (KENALOG) 0.1 % Apply 1 application topically 2 (two) times daily. 454 g 0  . amLODipine (NORVASC) 5 MG tablet Take 2 tablets (10 mg total) by mouth daily.    . Cholecalciferol (VITAMIN D) 2000 units CAPS Take 1 capsule (2,000 Units total) by mouth daily. 30 capsule   . furosemide (LASIX) 20 MG tablet Take 2 tablets (40 mg total) by mouth 2 (two) times daily. 2 in the morning, 2 in the early afternoon 120 tablet 3  . potassium chloride (K-DUR) 10 MEQ tablet TAKE 1 TABLET BY MOUTH DAILY AS NEEDED 30 tablet 3   No facility-administered medications prior to visit.      Per HPI unless specifically indicated in ROS section below Review of Systems     Objective:    BP (!) 166/68 (BP Location: Left Arm, Cuff Size: Normal)  Pulse 65   Temp 98.3 F (36.8 C) (Oral)   Wt 181 lb (82.1 kg)   SpO2 97%   BMI 28.35 kg/m   Wt Readings from Last 3 Encounters:  06/14/17 181 lb (82.1 kg)  06/01/17 180 lb 4 oz (81.8 kg)  05/24/17 184 lb (83.5 kg)    Physical Exam  Constitutional: He appears well-developed and well-nourished. No distress.  HENT:  Mouth/Throat: Oropharynx is clear and moist. No oropharyngeal exudate.  HOH  Cardiovascular: Normal rate and intact distal pulses. An irregularly irregular rhythm present.  Murmur (1/6 systolic) heard. Pulmonary/Chest: Effort normal and breath sounds normal. No respiratory distress. He has no wheezes. He has no rales.  Musculoskeletal: He exhibits edema  (1+ pitting).  Psychiatric: He has a normal mood and affect.  Nursing note and vitals reviewed.  Results for orders placed or performed in visit on 06/11/17  Hemoglobin [facility])  Result Value Ref Range   Hemoglobin 8.1 (L) 13.0 - 18.0 g/dL   Lab Results  Component Value Date   CREATININE 1.77 (H) 06/01/2017   BUN 55 (H) 06/01/2017   NA 138 06/01/2017   K 4.1 06/01/2017   CL 103 06/01/2017   CO2 29 06/01/2017       Assessment & Plan:   Problem List Items Addressed This Visit    Atrial fibrillation with slow ventricular response ([place])    Persistent, on eliquis since 2017. Slowed ventricular response off beta blocker. I have ordered baseline echocardiogram.       Relevant Medications   amLODipine (NORVASC) 10 MG tablet   furosemide (LASIX) 40 MG tablet   spironolactone (ALDACTONE) 25 MG tablet   Cardiac murmur    Very mild. Check echo - see below.       Relevant Orders   ECHOCARDIOGRAM COMPLETE   CKD stage 4 due to type 2 diabetes mellitus ([place])   HOH (hard of hearing)   HTN (hypertension) - Primary    Chronic, remains deteriorated despite increasing amlodipine to 49m daily. Known renal hypertension and anticipate arterial stiffness contributing to elevated readings. Need to be cautious with antihypertensive titration to avoid bottoming out blood pressure/syncope/renal injury. Reviewed low sodium diet. Will add spironolactone 12.553mQOD - caution in CKD - and I've asked patient to return in 10-14 days for labs and recheck prior to his next scheduled procrit injection. Consider cards eval if no improvement.  Medication choices limited by bradycardia, kidney disease.       Relevant Medications   amLODipine (NORVASC) 10 MG tablet   furosemide (LASIX) 40 MG tablet   spironolactone (ALDACTONE) 25 MG tablet   Pedal edema    Chronic, persistent. Continue lasix 4054mid.  Pt endorses effective UOP      Relevant Orders   ECHOCARDIOGRAM COMPLETE       Meds ordered this  encounter  Medications  . DISCONTD: furosemide (LASIX) 20 MG tablet    Sig: Take 2 tablets (40 mg total) by mouth 2 (two) times daily. 2 in the morning, 2 in the early afternoon    Dispense:  120 tablet    Refill:  3  . Cholecalciferol (VITAMIN D) 2000 units CAPS    Sig: Take 1 capsule (2,000 Units total) by mouth daily.    Dispense:  30 capsule  . amLODipine (NORVASC) 10 MG tablet    Sig: Take 1 tablet (10 mg total) by mouth daily.    Dispense:  90 tablet    Refill:  3  . furosemide (  LASIX) 40 MG tablet    Sig: Take 1 tablet (40 mg total) by mouth 2 (two) times daily.    Dispense:  180 tablet    Refill:  3    Use this dose  . spironolactone (ALDACTONE) 25 MG tablet    Sig: Take 0.5 tablets (12.5 mg total) by mouth every other day.    Dispense:  15 tablet    Refill:  3   Orders Placed This Encounter  Procedures  . ECHOCARDIOGRAM COMPLETE    Standing Status:   Future    Standing Expiration Date:   09/15/2018    Order Specific Question:   Where should this test be performed    Answer:   [facility]    Order Specific Question:   Perflutren DEFINITY (image enhancing agent) should be administered unless hypersensitivity or allergy exist    Answer:   Administer Perflutren    Order Specific Question:   Expected Date:    Answer:   1 week    Follow up plan: No Follow-up on file.  [name], MD

## 2017-06-14 NOTE — Assessment & Plan Note (Addendum)
Chronic, persistent. Continue lasix 40mg  bid.  Pt endorses effective UOP

## 2017-06-14 NOTE — Assessment & Plan Note (Signed)
Persistent, on eliquis since 2017. Slowed ventricular response off beta blocker. I have ordered baseline echocardiogram.

## 2017-06-14 NOTE — Assessment & Plan Note (Addendum)
Chronic, remains deteriorated despite increasing amlodipine to 10mg  daily. Known renal hypertension and anticipate arterial stiffness contributing to elevated readings. Need to be cautious with antihypertensive titration to avoid bottoming out blood pressure/syncope/renal injury. Reviewed low sodium diet. Will add spironolactone 12.5mg  QOD - caution in CKD - and I've asked patient to return in 10-14 days for labs and recheck prior to his next scheduled procrit injection. Consider cards eval if no improvement.  Medication choices limited by bradycardia, kidney disease.

## 2017-06-14 NOTE — Patient Instructions (Addendum)
Continue current regimen of  1. Amlodipine 10mg  once daily 2. Lisinopril 20mg  twice daily 3. Furosemide (Lasix) 40mg  twice daily 4. Doxazosin (Cardura) 8mg  once nightly 5. New medicine Spironolactone 12.5mg  (1/2 tablet) every other daily  Return in 2 weeks for follow up visit. We will schedule you for heart ultrasound due to leg swelling.  Restart vitamin D 2000 units over the counter daily.  Restart preservision multivitamin for eyes.  Avoid adding salt, watch sodium in diet - limit sodium to 1.5gm/day.

## 2017-06-14 NOTE — Assessment & Plan Note (Signed)
Very mild. Check echo - see below.

## 2017-06-25 ENCOUNTER — Inpatient Hospital Stay: Payer: Medicare Other

## 2017-06-28 ENCOUNTER — Encounter: Payer: Self-pay | Admitting: Family Medicine

## 2017-06-28 ENCOUNTER — Ambulatory Visit (INDEPENDENT_AMBULATORY_CARE_PROVIDER_SITE_OTHER): Payer: Medicare Other | Admitting: Family Medicine

## 2017-06-28 VITALS — BP 140/60 | HR 65 | Temp 98.2°F | Wt 184.0 lb

## 2017-06-28 DIAGNOSIS — H9193 Unspecified hearing loss, bilateral: Secondary | ICD-10-CM

## 2017-06-28 DIAGNOSIS — I10 Essential (primary) hypertension: Secondary | ICD-10-CM | POA: Diagnosis not present

## 2017-06-28 DIAGNOSIS — R600 Localized edema: Secondary | ICD-10-CM | POA: Diagnosis not present

## 2017-06-28 DIAGNOSIS — R011 Cardiac murmur, unspecified: Secondary | ICD-10-CM

## 2017-06-28 DIAGNOSIS — I4891 Unspecified atrial fibrillation: Secondary | ICD-10-CM | POA: Diagnosis not present

## 2017-06-28 DIAGNOSIS — N184 Chronic kidney disease, stage 4 (severe): Secondary | ICD-10-CM | POA: Diagnosis not present

## 2017-06-28 DIAGNOSIS — E1122 Type 2 diabetes mellitus with diabetic chronic kidney disease: Secondary | ICD-10-CM

## 2017-06-28 LAB — BASIC METABOLIC PANEL
BUN: 65 mg/dL — AB (ref 6–23)
CO2: 26 mEq/L (ref 19–32)
CREATININE: 1.94 mg/dL — AB (ref 0.40–1.50)
Calcium: 8.9 mg/dL (ref 8.4–10.5)
Chloride: 104 mEq/L (ref 96–112)
GFR: 34.67 mL/min — AB (ref >60.00)
GLUCOSE: 140 mg/dL — AB (ref 70–99)
Potassium: 4.3 mEq/L (ref 3.5–5.1)
Sodium: 137 mEq/L (ref 135–145)

## 2017-06-28 NOTE — Assessment & Plan Note (Signed)
Very hard of hearing. I have continued recommending he no longer drive, and family is in agreement. He continues to request limited driving ability. I advised he go to local [facility] for further evaluation.

## 2017-06-28 NOTE — Assessment & Plan Note (Signed)
Chronic, significant dependent edema.

## 2017-06-28 NOTE — Assessment & Plan Note (Signed)
Update BMP after recently starting spironolactone.

## 2017-06-28 NOTE — Patient Instructions (Signed)
Blood pressure is looking better! Blood test today.  Continue current medicines. Reschedule infusion center at your convenience. We will await ultrasound of heart.

## 2017-06-28 NOTE — Assessment & Plan Note (Addendum)
Improvement in blood pressures noted today in office as well as at home - unclear if due to spironolactone or due to his granddaughter supervising medication administration. Regardless, will continue current regimen with grand daughter assistance. Check BMP today after starting low dose QOD spironolactone. Pt and family agree with plan.

## 2017-06-28 NOTE — Progress Notes (Signed)
BP 140/60 (BP Location: Right Arm, Cuff Size: Normal)   Pulse 65   Temp 98.2 F (36.8 C) (Oral)   Wt 184 lb (83.5 kg)   SpO2 97%   BMI 28.82 kg/m    CC: HTN f/u Subjective:    Patient ID: Charles Rodgers, male    DOB: [birth year], 84 y.o.   MRN: [id number]  HPI: Charles Rodgers is a 84 y.o. male presenting on 06/28/2017 for Hypertension (Here for 2 wk follow-up. Pt is accompanied by brother, Charles Rodgers and granddaughter, Charles Rodgers. )   See prior note for details. Grand daughter - Charles Rodgers - has been helping him with his medications.   BP persistently elevated over the last several office visits. Last visit we started spironolactone 12.'5mg'$  QOD in addition to amlodipine '10mg'$  daily, lasix '40mg'$  bid, lisinopril '20mg'$  bid, and cardura '8mg'$  daily. Here for follow up. He reports compliance with spironolactone QOD - takes at 2pm. Persistent chronic pedal edema. Upcoming echocardiogram in setting of afib with systolic murmur and pedal edema.   bp at home running 140/60s.   Very hard of hearing but still wants to drive.   Relevant past medical, surgical, family and social history reviewed and updated as indicated. Interim medical history since our last visit reviewed. Allergies and medications reviewed and updated. Outpatient Medications Prior to Visit  Medication Sig Dispense Refill  . amLODipine (NORVASC) 10 MG tablet Take 1 tablet (10 mg total) by mouth daily. 90 tablet 3  . apixaban (ELIQUIS) 2.5 MG TABS tablet Take 1 tablet (2.5 mg total) by mouth 2 (two) times daily. 60 tablet 6  . Blood Glucose Monitoring Suppl (ONE TOUCH ULTRA 2) W/DEVICE KIT     . Casanthranol-Docusate Sodium 30-100 MG CAPS Take by mouth daily.    . Cholecalciferol (VITAMIN D) 2000 units CAPS Take 1 capsule (2,000 Units total) by mouth daily. 30 capsule   . dorzolamide-timolol (COSOPT) 22.3-6.8 MG/ML ophthalmic solution Place 1 drop into both eyes 2 (two) times daily. 10 mL   . doxazosin (CARDURA) 8 MG tablet  TAKE 1 TABLET BY MOUTH AT BEDTIME 90 tablet 1  . ferrous sulfate 325 (65 FE) MG tablet TAKE 1 TABLET BY MOUTH ONCE A DAY WITH BREAKFAST 90 tablet 0  . furosemide (LASIX) 40 MG tablet Take 1 tablet (40 mg total) by mouth 2 (two) times daily. 180 tablet 3  . glucose blood (ONE TOUCH ULTRA TEST) test strip USE TO CHECK BLOOD SUGAR 3 TIMES DAILY Dx: E11.42 300 each 2  . latanoprost (XALATAN) 0.005 % ophthalmic solution Place 1 drop into both eyes at bedtime. 2.5 mL   . lisinopril (PRINIVIL,ZESTRIL) 20 MG tablet Take 1 tablet (20 mg total) by mouth 2 (two) times daily. 180 tablet 1  . Multiple Vitamins-Minerals (PRESERVISION AREDS) CAPS Take 2 capsules by mouth daily.    Glory Rosebush DELICA LANCETS MISC Use as directed    . pravastatin (PRAVACHOL) 40 MG tablet TAKE 1 TABLET BY MOUTH AT BEDTIME 90 tablet 1  . spironolactone (ALDACTONE) 25 MG tablet Take 0.5 tablets (12.5 mg total) by mouth every other day. 15 tablet 3  . triamcinolone cream (KENALOG) 0.1 % Apply 1 application topically 2 (two) times daily. 454 g 0   No facility-administered medications prior to visit.      Per HPI unless specifically indicated in ROS section below Review of Systems     Objective:    BP 140/60 (BP Location: Right Arm, Cuff Size: Normal)   Pulse 65  Temp 98.2 F (36.8 C) (Oral)   Wt 184 lb (83.5 kg)   SpO2 97%   BMI 28.82 kg/m   Wt Readings from Last 3 Encounters:  06/28/17 184 lb (83.5 kg)  06/14/17 181 lb (82.1 kg)  06/01/17 180 lb 4 oz (81.8 kg)    Physical Exam  Constitutional: He appears well-developed and well-nourished. No distress.  Very HOH  Cardiovascular: Normal rate and intact distal pulses. An irregularly irregular rhythm present.  Murmur (mild systolic) heard. Pulmonary/Chest: Effort normal and breath sounds normal. No respiratory distress. He has no wheezes. He has no rales.  Musculoskeletal: He exhibits edema (2+ pitting bilaterally).  Nursing note and vitals reviewed.  Results for  orders placed or performed in visit on 06/11/17  Hemoglobin [facility])  Result Value Ref Range   Hemoglobin 8.1 (L) 13.0 - 18.0 g/dL   Lab Results  Component Value Date   HGBA1C 6.3 03/25/2017       Assessment & Plan:   Problem List Items Addressed This Visit    Atrial fibrillation with slow ventricular response ([place])   Cardiac murmur   CKD stage 4 due to type 2 diabetes mellitus ([place])    Update BMP after recently starting spironolactone.      HOH (hard of hearing)    Very hard of hearing. I have continued recommending he no longer drive, and family is in agreement. He continues to request limited driving ability. I advised he go to local [employer] for further evaluation.       HTN (hypertension) - Primary    Improvement in blood pressures noted today in office as well as at home - unclear if due to spironolactone or due to his granddaughter supervising medication administration. Regardless, will continue current regimen with grand daughter assistance. Check BMP today after starting low dose QOD spironolactone. Pt and family agree with plan.      Relevant Orders   Basic metabolic panel   Pedal edema    Chronic, significant dependent edema.           No orders of the defined types were placed in this encounter.  Orders Placed This Encounter  Procedures  . Basic metabolic panel    Follow up plan: No follow-ups on file.  [name], MD

## 2017-07-02 ENCOUNTER — Telehealth: Payer: Self-pay | Admitting: Family Medicine

## 2017-07-02 ENCOUNTER — Other Ambulatory Visit: Payer: Self-pay

## 2017-07-02 ENCOUNTER — Ambulatory Visit (INDEPENDENT_AMBULATORY_CARE_PROVIDER_SITE_OTHER): Payer: Medicare Other

## 2017-07-02 DIAGNOSIS — R011 Cardiac murmur, unspecified: Secondary | ICD-10-CM | POA: Diagnosis not present

## 2017-07-02 DIAGNOSIS — R600 Localized edema: Secondary | ICD-10-CM | POA: Diagnosis not present

## 2017-07-02 NOTE — Telephone Encounter (Signed)
Copied from [place] [id number]. Topic: General - Other >> Jul 02, 2017 10:21 AM Yvette Rack wrote: Reason for CRM: pt brother [name] is calling for pt lab results

## 2017-07-02 NOTE — Telephone Encounter (Signed)
Left detailed message on voicemail.  

## 2017-07-02 NOTE — Telephone Encounter (Signed)
Pt was seen 06/28/17; will fwd to Dr Darnell Level and Dr Damita Dunnings since Dr Darnell Level out of office.

## 2017-07-02 NOTE — Telephone Encounter (Signed)
I'll await final input from PCP but labs appear stable and would continue as Dr. Darnell Level advised. Thanks.  Routed to PCP as FYI.

## 2017-07-03 NOTE — Telephone Encounter (Signed)
See result note.  

## 2017-07-05 ENCOUNTER — Encounter: Payer: Self-pay | Admitting: Family Medicine

## 2017-07-06 ENCOUNTER — Telehealth: Payer: Self-pay | Admitting: Family Medicine

## 2017-07-06 NOTE — Telephone Encounter (Signed)
Noted in result notes. 

## 2017-07-06 NOTE — Telephone Encounter (Signed)
Copied from [place] [id number]. Topic: Quick Communication - Lab Results >> Jul 06, 2017  2:00 PM Carolyn Stare wrote:  Pt brother return cal lfor lab results would like a call back . Triage line busy   [name] is pt brother [phone]

## 2017-07-06 NOTE — Telephone Encounter (Signed)
Pt's brother called to receive lab results.  Informed of result notes of BMP 4/1, and Echocardiogram on 4/5, per notes of Dr. Danise Mina.  Brother, Richard, verb. Understanding.

## 2017-07-06 NOTE — Telephone Encounter (Signed)
Left message for pt's brother, [name] (on dpr), to call back.   See result note for 07/02/17 ECHO and 06/28/17 labs.

## 2017-07-06 NOTE — Telephone Encounter (Signed)
Charles Rodgers is wanting to speak to Dr. Danise Mina  He is getting different answers and want to speak to him

## 2017-07-06 NOTE — Telephone Encounter (Signed)
Patient brother calling again, states he did not receive message. Call back [phone]

## 2017-07-06 NOTE — Telephone Encounter (Signed)
Brother, [name] called back, said he was returning a call  Please call him back.

## 2017-07-08 NOTE — Telephone Encounter (Signed)
Spoke with pt's brother, [name], says he had been given lab and imaging results.

## 2017-07-09 ENCOUNTER — Inpatient Hospital Stay: Payer: Medicare Other | Attending: Hematology and Oncology

## 2017-07-09 ENCOUNTER — Inpatient Hospital Stay: Payer: Medicare Other

## 2017-07-09 ENCOUNTER — Telehealth: Payer: Self-pay

## 2017-07-09 ENCOUNTER — Ambulatory Visit: Payer: Medicare Other | Admitting: Family Medicine

## 2017-07-09 VITALS — BP 145/76

## 2017-07-09 DIAGNOSIS — I129 Hypertensive chronic kidney disease with stage 1 through stage 4 chronic kidney disease, or unspecified chronic kidney disease: Secondary | ICD-10-CM | POA: Diagnosis not present

## 2017-07-09 DIAGNOSIS — D631 Anemia in chronic kidney disease: Secondary | ICD-10-CM | POA: Diagnosis not present

## 2017-07-09 DIAGNOSIS — N184 Chronic kidney disease, stage 4 (severe): Secondary | ICD-10-CM

## 2017-07-09 DIAGNOSIS — N183 Chronic kidney disease, stage 3 (moderate): Secondary | ICD-10-CM | POA: Insufficient documentation

## 2017-07-09 LAB — HEMOGLOBIN: Hemoglobin: 7.8 g/dL — ABNORMAL LOW (ref 13.0–18.0)

## 2017-07-09 MED ORDER — EPOETIN ALFA 10000 UNIT/ML IJ SOLN
10000.0000 [IU] | Freq: Once | INTRAMUSCULAR | Status: AC
  Administered 2017-07-09: 10000 [IU] via SUBCUTANEOUS

## 2017-07-09 NOTE — Telephone Encounter (Signed)
Please call Charles Rodgers:  Patient's BP is well controlled and was able to receive the Procrit injection today.  Charles Rodgers, patient's brother, would like to know if he needs to return sooner than the already scheduled 2 week lab/Procrit since he was not able to receive inj last time and HGB is 7.8 today.    Charles Rodgers says his energy has decreased but he also is not eating much.  Has not noticed an increase in dyspnea on exertion.

## 2017-07-12 NOTE — Telephone Encounter (Signed)
I have attempted to call Mr. Wynns's brother, [name], a couple of times now. I am not getting an answer. Patient has been fairly consist with regards to his hemoglobin. I tracked him back to February of this year and found that he has been running between 7.7 and 8.4. Last clinic hemoglobin was 7.8, but as mentioned, he had missed his previous dose of Procrit due to elevated blood pressures.   Recent echo demonstrated mild to moderate MVR. PA pressures were severely elevated at 60 mm Hg and above. EF normal at 60-65%. Patient in controlled A.fib during exam.   Energy issues and appetite are likely multi-factorial and are being addressed by Dr. Danise Mina. His increase in exertional dyspnea could certainly be related to his anemia. The Procrit should help with that. We can potentially set him up for transfusion if he would be in agreement. His next lab draw is on 07/23/2017. Without knowing the extent of his symptoms, it is difficult to say whether or not this should be sooner than later. Will have someone try to touch base with patient again tomorrow (07/13/2017).  Honor Loh, MSN, APRN, FNP-C, CEN Oncology/Hematology Nurse Practitioner  [facility] 07/12/17, 5:50 PM

## 2017-07-20 ENCOUNTER — Encounter: Payer: Self-pay | Admitting: Family Medicine

## 2017-07-20 ENCOUNTER — Ambulatory Visit (INDEPENDENT_AMBULATORY_CARE_PROVIDER_SITE_OTHER): Payer: Medicare Other | Admitting: Family Medicine

## 2017-07-20 VITALS — BP 136/62 | HR 58 | Temp 97.9°F | Ht 67.0 in | Wt 183.0 lb

## 2017-07-20 DIAGNOSIS — H6123 Impacted cerumen, bilateral: Secondary | ICD-10-CM

## 2017-07-20 DIAGNOSIS — R0981 Nasal congestion: Secondary | ICD-10-CM

## 2017-07-20 DIAGNOSIS — I340 Nonrheumatic mitral (valve) insufficiency: Secondary | ICD-10-CM

## 2017-07-20 DIAGNOSIS — H6120 Impacted cerumen, unspecified ear: Secondary | ICD-10-CM | POA: Insufficient documentation

## 2017-07-20 DIAGNOSIS — I420 Dilated cardiomyopathy: Secondary | ICD-10-CM | POA: Insufficient documentation

## 2017-07-20 MED ORDER — FLUTICASONE PROPIONATE 50 MCG/ACT NA SUSP: 2.0000 | Freq: Every day | NASAL | 3 refills | Status: AC

## 2017-07-20 MED ORDER — LORATADINE 10 MG PO TABS: 10.0000 mg | ORAL_TABLET | Freq: Every day | ORAL | 3 refills | Status: DC

## 2017-07-20 NOTE — Assessment & Plan Note (Addendum)
Reviewed results with patient. 

## 2017-07-20 NOTE — Assessment & Plan Note (Addendum)
With afib.  Declines OSA symptoms "I sleep well" Declines further evaluation at this time.  Do anticipate blood transfusion would help. Continue procrit for now.

## 2017-07-20 NOTE — Progress Notes (Signed)
BP 136/62 (BP Location: Left Arm, Patient Position: Sitting, Cuff Size: Normal)   Pulse (!) 58   Temp 97.9 F (36.6 C) (Oral)   Ht 5' 7"  (1.702 m)   Wt 183 lb (83 kg)   SpO2 96%   BMI 28.66 kg/m    CC: ear pain Subjective:    Patient ID: Charles Rodgers, male    DOB: 03-17-1925, 84 y.o.   MRN: [id number]  HPI: Charles Rodgers is a 84 y.o. male presenting on 07/20/2017 for Ear Pain (C/o pain in bilateral ears.  Says it sounds like he's talking in a barrel.  Also, says head feels stopped up.  Pt accompanied by brother, [name].)   Here with brother [name].   Ears stopped up over the last 5 days. Woke up with head congestion, after raking leaves outdoors. Pollen exposure. R ear feels "ticking" without earache. Significant nasal sinus congestion.   Denies fevers, headache, ST or cough. No sneezing or itchy watery eyes.  Hasn't tried any medicines for this.   Recent echo showed mild-mod MR, PA pressure severely elevated with normal EF. Pt endorses restful sleep, no known snoring or apnea. Declines cards eval.   Relevant past medical, surgical, family and social history reviewed and updated as indicated. Interim medical history since our last visit reviewed. Allergies and medications reviewed and updated. Outpatient Medications Prior to Visit  Medication Sig Dispense Refill  . amLODipine (NORVASC) 10 MG tablet Take 1 tablet (10 mg total) by mouth daily. 90 tablet 3  . apixaban (ELIQUIS) 2.5 MG TABS tablet Take 1 tablet (2.5 mg total) by mouth 2 (two) times daily. 60 tablet 6  . Blood Glucose Monitoring Suppl (ONE TOUCH ULTRA 2) W/DEVICE KIT     . Casanthranol-Docusate Sodium 30-100 MG CAPS Take by mouth daily.    . Cholecalciferol (VITAMIN D) 2000 units CAPS Take 1 capsule (2,000 Units total) by mouth daily. 30 capsule   . dorzolamide-timolol (COSOPT) 22.3-6.8 MG/ML ophthalmic solution Place 1 drop into both eyes 2 (two) times daily. 10 mL   . doxazosin (CARDURA) 8 MG  tablet TAKE 1 TABLET BY MOUTH AT BEDTIME 90 tablet 1  . ferrous sulfate 325 (65 FE) MG tablet TAKE 1 TABLET BY MOUTH ONCE A DAY WITH BREAKFAST 90 tablet 0  . furosemide (LASIX) 40 MG tablet Take 1 tablet (40 mg total) by mouth 2 (two) times daily. 180 tablet 3  . glucose blood (ONE TOUCH ULTRA TEST) test strip USE TO CHECK BLOOD SUGAR 3 TIMES DAILY Dx: E11.42 300 each 2  . latanoprost (XALATAN) 0.005 % ophthalmic solution Place 1 drop into both eyes at bedtime. 2.5 mL   . lisinopril (PRINIVIL,ZESTRIL) 20 MG tablet Take 1 tablet (20 mg total) by mouth 2 (two) times daily. 180 tablet 1  . Multiple Vitamins-Minerals (PRESERVISION AREDS) CAPS Take 2 capsules by mouth daily.    Glory Rosebush DELICA LANCETS MISC Use as directed    . pravastatin (PRAVACHOL) 40 MG tablet TAKE 1 TABLET BY MOUTH AT BEDTIME 90 tablet 1  . spironolactone (ALDACTONE) 25 MG tablet Take 0.5 tablets (12.5 mg total) by mouth every other day. 15 tablet 3  . triamcinolone cream (KENALOG) 0.1 % Apply 1 application topically 2 (two) times daily. 454 g 0   No facility-administered medications prior to visit.      Per HPI unless specifically indicated in ROS section below Review of Systems     Objective:    BP 136/62 (BP Location: Left  Arm, Patient Position: Sitting, Cuff Size: Normal)   Pulse (!) 58   Temp 97.9 F (36.6 C) (Oral)   Ht 5' 7"  (1.702 m)   Wt 183 lb (83 kg)   SpO2 96%   BMI 28.66 kg/m   Wt Readings from Last 3 Encounters:  07/20/17 183 lb (83 kg)  06/28/17 184 lb (83.5 kg)  06/14/17 181 lb (82.1 kg)    Physical Exam  Constitutional: He appears well-developed and well-nourished. No distress.  HENT:  Head: Normocephalic and atraumatic.  Right Ear: Tympanic membrane, external ear and ear canal normal. Decreased hearing (very) is noted.  Left Ear: Tympanic membrane, external ear and ear canal normal. Decreased hearing (very) is noted.  Nose: Mucosal edema present. No rhinorrhea. Right sinus exhibits no  maxillary sinus tenderness and no frontal sinus tenderness. Left sinus exhibits no maxillary sinus tenderness and no frontal sinus tenderness.  Mouth/Throat: Uvula is midline, oropharynx is clear and moist and mucous membranes are normal. No oropharyngeal exudate, posterior oropharyngeal edema, posterior oropharyngeal erythema or tonsillar abscesses.  Wax in R ear, cerumen impaction in L canal Wax removed with plastic curette and alligator forceps Pt tolerated well.   Eyes: Pupils are equal, round, and reactive to light. Conjunctivae and EOM are normal. No scleral icterus.  Neck: Normal range of motion. Neck supple.  Cardiovascular: Normal rate, normal heart sounds and intact distal pulses. An irregularly irregular rhythm present.  No murmur heard. Pulmonary/Chest: Effort normal and breath sounds normal. No respiratory distress. He has no wheezes. He has no rales.  Lymphadenopathy:    He has no cervical adenopathy.  Skin: Skin is warm and dry. No rash noted.  Nursing note and vitals reviewed.  Results for orders placed or performed in visit on 07/09/17  Hemoglobin [facility])  Result Value Ref Range   Hemoglobin 7.8 (L) 13.0 - 18.0 g/dL      Assessment & Plan:   Problem List Items Addressed This Visit    Cerumen impaction - Primary    Cerumen disimpaction performed and pt tolerated well. Persistently hard of hearing. Wears hearing aide on left side      Dilated cardiomyopathy ([place])    With afib.  Declines OSA symptoms "I sleep well" Declines further evaluation at this time.  Do anticipate blood transfusion would help. Continue procrit for now.       Moderate mitral valve regurgitation    Reviewed results with patient.       Nasal sinus congestion    Anticipate symptoms largely related to allergic rhinitis with eustachian tube dysfunction on right - rec start claritin, flonase. Advised monitoring for nose bleeds.           Meds ordered this encounter  Medications  .  loratadine (CLARITIN) 10 MG tablet    Sig: Take 1 tablet (10 mg total) by mouth daily.    Dispense:  30 tablet    Refill:  3  . fluticasone (FLONASE) 50 MCG/ACT nasal spray    Sig: Place 2 sprays into both nostrils daily.    Dispense:  16 g    Refill:  3   No orders of the defined types were placed in this encounter.   Follow up plan: No follow-ups on file.  [name], MD

## 2017-07-20 NOTE — Assessment & Plan Note (Addendum)
Anticipate symptoms largely related to allergic rhinitis with eustachian tube dysfunction on right - rec start claritin, flonase. Advised monitoring for nose bleeds.

## 2017-07-20 NOTE — Patient Instructions (Addendum)
Ears cleaned today. I think congestion is coming from allergies. Start taking claritin daily and flonase daily. May use nasal saline irrigation as well.  Let me know how this helps.

## 2017-07-20 NOTE — Assessment & Plan Note (Addendum)
Cerumen disimpaction performed and pt tolerated well. Persistently hard of hearing. Wears hearing aide on left side

## 2017-07-23 ENCOUNTER — Inpatient Hospital Stay: Payer: Medicare Other

## 2017-07-23 ENCOUNTER — Inpatient Hospital Stay (HOSPITAL_BASED_OUTPATIENT_CLINIC_OR_DEPARTMENT_OTHER): Payer: Medicare Other | Admitting: Hematology and Oncology

## 2017-07-23 VITALS — BP 149/59 | HR 64 | Temp 97.3°F | Resp 18 | Wt 183.4 lb

## 2017-07-23 DIAGNOSIS — Z87891 Personal history of nicotine dependence: Secondary | ICD-10-CM

## 2017-07-23 DIAGNOSIS — E1122 Type 2 diabetes mellitus with diabetic chronic kidney disease: Secondary | ICD-10-CM

## 2017-07-23 DIAGNOSIS — N184 Chronic kidney disease, stage 4 (severe): Principal | ICD-10-CM

## 2017-07-23 DIAGNOSIS — D631 Anemia in chronic kidney disease: Secondary | ICD-10-CM

## 2017-07-23 DIAGNOSIS — N183 Chronic kidney disease, stage 3 (moderate): Secondary | ICD-10-CM | POA: Diagnosis not present

## 2017-07-23 DIAGNOSIS — D696 Thrombocytopenia, unspecified: Secondary | ICD-10-CM

## 2017-07-23 DIAGNOSIS — I129 Hypertensive chronic kidney disease with stage 1 through stage 4 chronic kidney disease, or unspecified chronic kidney disease: Secondary | ICD-10-CM

## 2017-07-23 DIAGNOSIS — Z7189 Other specified counseling: Secondary | ICD-10-CM | POA: Insufficient documentation

## 2017-07-23 DIAGNOSIS — D472 Monoclonal gammopathy: Secondary | ICD-10-CM | POA: Diagnosis not present

## 2017-07-23 LAB — CBC WITH DIFFERENTIAL/PLATELET
Basophils Absolute: 0 10*3/uL (ref 0–0.1)
Basophils Relative: 0 %
Eosinophils Absolute: 0.1 10*3/uL (ref 0–0.7)
Eosinophils Relative: 2 %
HCT: 24.9 % — ABNORMAL LOW (ref 40.0–52.0)
Hemoglobin: 8.1 g/dL — ABNORMAL LOW (ref 13.0–18.0)
Lymphocytes Relative: 9 %
Lymphs Abs: 0.4 10*3/uL — ABNORMAL LOW (ref 1.0–3.6)
MCH: 30.3 pg (ref 26.0–34.0)
MCHC: 32.7 g/dL (ref 32.0–36.0)
MCV: 92.7 fL (ref 80.0–100.0)
Monocytes Absolute: 0.5 10*3/uL (ref 0.2–1.0)
Monocytes Relative: 10 %
Neutro Abs: 3.9 10*3/uL (ref 1.4–6.5)
Neutrophils Relative %: 79 %
Platelets: 78 10*3/uL — ABNORMAL LOW (ref 150–440)
RBC: 2.68 MIL/uL — ABNORMAL LOW (ref 4.40–5.90)
RDW: 14.8 % — ABNORMAL HIGH (ref 11.5–14.5)
WBC: 4.9 10*3/uL (ref 3.8–10.6)

## 2017-07-23 LAB — COMPREHENSIVE METABOLIC PANEL
ALT: 14 U/L — ABNORMAL LOW (ref 17–63)
AST: 22 U/L (ref 15–41)
Albumin: 3.5 g/dL (ref 3.5–5.0)
Alkaline Phosphatase: 59 U/L (ref 38–126)
Anion gap: 8 (ref 5–15)
BUN: 71 mg/dL — ABNORMAL HIGH (ref 6–20)
CO2: 23 mmol/L (ref 22–32)
Calcium: 9 mg/dL (ref 8.9–10.3)
Chloride: 105 mmol/L (ref 101–111)
Creatinine, Ser: 2 mg/dL — ABNORMAL HIGH (ref 0.61–1.24)
GFR calc Af Amer: 32 mL/min — ABNORMAL LOW (ref >60)
GFR calc non Af Amer: 28 mL/min — ABNORMAL LOW (ref >60)
Glucose, Bld: 143 mg/dL — ABNORMAL HIGH (ref 65–99)
Potassium: 4.1 mmol/L (ref 3.5–5.1)
Sodium: 136 mmol/L (ref 135–145)
Total Bilirubin: 0.5 mg/dL (ref 0.3–1.2)
Total Protein: 6.7 g/dL (ref 6.5–8.1)

## 2017-07-23 LAB — FERRITIN: Ferritin: 39 ng/mL (ref 24–336)

## 2017-07-23 MED ORDER — EPOETIN ALFA 10000 UNIT/ML IJ SOLN
10000.0000 [IU] | Freq: Once | INTRAMUSCULAR | Status: AC
  Administered 2017-07-23: 10000 [IU] via SUBCUTANEOUS
  Filled 2017-07-23: qty 2

## 2017-07-23 NOTE — Progress Notes (Signed)
Patient offers no complaints today. Accompanied by his brother.

## 2017-07-23 NOTE — Progress Notes (Signed)
[facility] day:   07/23/2017   Chief Complaint: Charles Rodgers is an 84 y.o. male with anemia of chronic renal disease, monoclonal gammopathy of unknown significance (MGUS), and mild thrombocytopenia who is seen for 3 month assessment on Procrit.  HPI: The patient was last seen in the medical oncology clinic on 04/30/2017.  At that time, he denied any physical complaints today.  His weight was up 16 pounds since 12/2016.  Exam revealed no adenopathy or hepatosplenomegaly.  Hematocrit was 26.0 with a hemoglobin of 8.4 after missing Procrit injections.  Platelet count was 78,000. Creatinine was 1.79 (previously 1.99).  M-spike was 0.3 gm/dL.  Ferritin was 69.  He was scheduled to receive Procrit every 2 weeks.  He received Procrit on 04/30/2017, 05/14/2017, and 07/09/2017.   Hemoglobin was 7.8 on 07/09/2017.  He has missed Procrit injections secondary to elevated blood pressure.  He was seen by Dr. Danise Mina on 07/20/2017 for ear pain and feeling his head was stopped up.  He felt that he was talking in a barrel.  He had impacted cerumen which was removed.  During the interim, she has felt "alright".  He denies any shortness of breath or chest pain. He denies any new symptoms.   Past Medical History:  Diagnosis Date  . Abscess of axilla, left 02/07/2015  . Arthritis   . BPH (benign prostatic hypertrophy)    with elevated PSA, s/p benign biopsy, followed yearly by [facility] (last seen 01/16/2013)  . CKD (chronic kidney disease) stage 3, GFR 30-59 ml/min ([place])    [place] -> 2017 requested PCP start following  . COPD (chronic obstructive pulmonary disease) ([place]) 2016   by xray  . Diabetes mellitus type 2, controlled ([place])   . Glaucoma   . Hearing loss   . HLD (hyperlipidemia)   . HTN (hypertension)   . IgM monoclonal gammopathy of uncertain significance 08/27/2014   07/2014 suggest rpt 6 mo.  . Malignant melanoma of left upper arm ([place])  11/14/2014   s/p excision complicated by infected axillary seroma (Dr Carmin Muskrat)  . Melanoma ([place]) 2014   nose and left hand  . Secondary hyperparathyroidism ([place])   . Subclavian artery stenosis, right ([place]) 2014   found on carotid US, seen [facility], recommended recheck 1 yr at vascular lab with Dr. Delana Meyer  . Thyroid nodule 02/2013   right upper lobe - s/p biopsy - benign follicular nodule  . Urine incontinence     Past Surgical History:  Procedure Laterality Date  . APPENDECTOMY  1938  . arm surgery     Right after trauma  . BIOPSY THYROID Right 58/5929   benign follicular nodule  . carotid US  07/2007   WNL  . 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 US ECHOCARDIOGRAPHY  06/2007   EF 55%, mod dil LA, mild LVH,mild pulm HTN    Family History  Problem Relation Age of Onset  . Cancer Mother        stomach  . Cancer Brother        colon  . Cancer Brother        colon  . Cancer Sister        hodgkin's lymphoma  . Cancer Brother        prostate  . Diabetes Brother   . Coronary artery disease Neg Hx   . Stroke Neg Hx     Social History:  reports that he quit smoking about 50 years ago. His smoking use included cigarettes. He has a 7.50 pack-year smoking history. He has never used smokeless tobacco. He reports that he does not drink alcohol or use drugs.  His wife has Alzheimer's.  His daughter, Loletha Carrow, brings food to the house.  His brother's [name]) phone number is:  [phone].  The patient is accompanied by his brother [name]) today.   Allergies: No Known Allergies  Current Medications: Current Outpatient Medications  Medication Sig Dispense Refill  . amLODipine (NORVASC) 10 MG tablet Take 1 tablet (10 mg total) by mouth daily. 90 tablet 3  . apixaban (ELIQUIS) 2.5 MG TABS tablet Take 1 tablet (2.5 mg total) by mouth 2 (two) times daily. 60 tablet 6  . Blood Glucose Monitoring Suppl (ONE TOUCH ULTRA 2) W/DEVICE KIT     .  Casanthranol-Docusate Sodium 30-100 MG CAPS Take by mouth daily.    . Cholecalciferol (VITAMIN D) 2000 units CAPS Take 1 capsule (2,000 Units total) by mouth daily. 30 capsule   . dorzolamide-timolol (COSOPT) 22.3-6.8 MG/ML ophthalmic solution Place 1 drop into both eyes 2 (two) times daily. 10 mL   . doxazosin (CARDURA) 8 MG tablet TAKE 1 TABLET BY MOUTH AT BEDTIME 90 tablet 1  . ferrous sulfate 325 (65 FE) MG tablet TAKE 1 TABLET BY MOUTH ONCE A DAY WITH BREAKFAST 90 tablet 0  . fluticasone (FLONASE) 50 MCG/ACT nasal spray Place 2 sprays into both nostrils daily. 16 g 3  . furosemide (LASIX) 40 MG tablet Take 1 tablet (40 mg total) by mouth 2 (two) times daily. 180 tablet 3  . glucose blood (ONE TOUCH ULTRA TEST) test strip USE TO CHECK BLOOD SUGAR 3 TIMES DAILY Dx: E11.42 300 each 2  . latanoprost (XALATAN) 0.005 % ophthalmic solution Place 1 drop into both eyes at bedtime. 2.5 mL   . lisinopril (PRINIVIL,ZESTRIL) 20 MG tablet Take 1 tablet (20 mg total) by mouth 2 (two) times daily. 180 tablet 1  . loratadine (CLARITIN) 10 MG tablet Take 1 tablet (10 mg total) by mouth daily. 30 tablet 3  . Multiple Vitamins-Minerals (PRESERVISION AREDS) CAPS Take 2 capsules by mouth daily.    Glory Rosebush DELICA LANCETS MISC Use as directed    . pravastatin (PRAVACHOL) 40 MG tablet TAKE 1 TABLET BY MOUTH AT BEDTIME 90 tablet 1  . spironolactone (ALDACTONE) 25 MG tablet Take 0.5 tablets (12.5 mg total) by mouth every other day. 15 tablet 3  . triamcinolone cream (KENALOG) 0.1 % Apply 1 application topically 2 (two) times daily. 454 g 0   No current facility-administered medications for this visit.     Review of Systems:  GENERAL:  Feels"alright".  No fevers, sweats or weight loss.  Weight up 3 pounds. PERFORMANCE STATUS (ECOG):  2 HEENT:  Hard of hearing.  Sinus congestion.  No visual changes, runny nose, sore throat, mouth sores or tenderness. Lungs: No shortness of breath or cough.  No  hemoptysis. Cardiac:  No chest pain, palpitations, orthopnea, or PND. GI:  No nausea, vomiting, diarrhea, constipation, melena or hematochezia. GU:  No urgency, frequency, dysuria, or hematuria. Musculoskeletal:  No back pain.  No joint pain.  No muscle tenderness. Extremities:  No pain or swelling. Skin:  No rashes or skin changes. Neuro:  No headache, numbness or weakness, balance or coordination issues. Endocrine:  No diabetes, thyroid issues, hot flashes or night sweats. Psych:  No mood changes, depression or anxiety. Pain:  No focal pain.  Review of systems:  All other systems reviewed and found to be negative.   Physical Exam:  Blood pressure (!) 149/59, pulse 64, temperature (!) 97.3 F (36.3 C), temperature source Tympanic, resp. rate 18, weight 183 lb 6 oz (83.2 kg), SpO2 99 %. GENERAL:  Well developed, well nourished, elderly gentleman sitting comfortably in the exam room in no acute distress.  He has a cane at his side. MENTAL STATUS:  Alert and oriented to person, place and time. HEAD:  White hair.  Normocephalic, atraumatic, face symmetric, no Cushingoid features. EYES:  Glasses.  Hazel/brown eyes.  Pupils equal round and reactive to light and accomodation.  No conjunctivitis or scleral icterus. ENT:  Hard of hearing.  Hearing aide.  Oropharynx clear without lesion.  Tongue normal. Mucous membranes moist.  RESPIRATORY:  Clear to auscultation without rales, wheezes or rhonchi. CARDIOVASCULAR:  Regular rate and rhythm without murmur, rub or gallop. ABDOMEN:  Ventral hernia.  Soft, non-tender, with active bowel sounds, and no hepatosplenomegaly.  No masses. SKIN:  Significant facial skin changes due to sun exposure.  ? razor burn to beard area.  No ulcers. EXTREMITIES: Dense lower extremity edema (chronic).  No skin discoloration or tenderness.  No palpable cords. LYMPH NODES: No palpable cervical, supraclavicular, axillary or inguinal adenopathy  NEUROLOGICAL:  Unremarkable. PSYCH:  Appropriate.    Appointment on 07/23/2017  Component Date Value Ref Range Status  . Sodium 07/23/2017 136  135 - 145 mmol/L Final  . Potassium 07/23/2017 4.1  3.5 - 5.1 mmol/L Final  . Chloride 07/23/2017 105  101 - 111 mmol/L Final  . CO2 07/23/2017 23  22 - 32 mmol/L Final  . Glucose, Bld 07/23/2017 143* 65 - 99 mg/dL Final  . BUN 07/23/2017 71* 6 - 20 mg/dL Final  . Creatinine, Ser 07/23/2017 2.00* 0.61 - 1.24 mg/dL Final  . Calcium 07/23/2017 9.0  8.9 - 10.3 mg/dL Final  . Total Protein 07/23/2017 6.7  6.5 - 8.1 g/dL Final  . Albumin 07/23/2017 3.5  3.5 - 5.0 g/dL Final  . AST 07/23/2017 22  15 - 41 U/L Final  . ALT 07/23/2017 14* 17 - 63 U/L Final  . Alkaline Phosphatase 07/23/2017 59  38 - 126 U/L Final  . Total Bilirubin 07/23/2017 0.5  0.3 - 1.2 mg/dL Final  . GFR calc non Af Amer 07/23/2017 28* >60 mL/min Final  . GFR calc Af Amer 07/23/2017 32* >60 mL/min Final   Comment: (NOTE) The eGFR has been calculated using the CKD EPI equation. This calculation has not been validated in all clinical situations. eGFR's persistently <60 mL/min signify possible Chronic Kidney Disease.   Georgiann Hahn gap 07/23/2017 8  5 - 15 Final   Performed at [facility], [address] [place],  [address]  . WBC 07/23/2017 4.9  3.8 - 10.6 K/uL Final  . RBC 07/23/2017 2.68* 4.40 - 5.90 MIL/uL Final  . Hemoglobin 07/23/2017 8.1* 13.0 - 18.0 g/dL Final  . HCT 07/23/2017 24.9* 40.0 - 52.0 % Final  . MCV 07/23/2017 92.7  80.0 - 100.0 fL Final  . MCH 07/23/2017 30.3  26.0 - 34.0 pg Final  . MCHC 07/23/2017 32.7  32.0 - 36.0 g/dL Final  . RDW 07/23/2017 14.8* 11.5 - 14.5 % Final  . Platelets 07/23/2017 78* 150 - 440 K/uL Final  . Neutrophils Relative % 07/23/2017 79  % Final  . Neutro Abs 07/23/2017 3.9  1.4 - 6.5 K/uL Final  . Lymphocytes Relative 07/23/2017 9  %  Final  . Lymphs Abs 07/23/2017 0.4* 1.0 - 3.6 K/uL Final  . Monocytes Relative 07/23/2017 10  % Final   . Monocytes Absolute 07/23/2017 0.5  0.2 - 1.0 K/uL Final  . Eosinophils Relative 07/23/2017 2  % Final  . Eosinophils Absolute 07/23/2017 0.1  0 - 0.7 K/uL Final  . Basophils Relative 07/23/2017 0  % Final  . Basophils Absolute 07/23/2017 0.0  0 - 0.1 K/uL Final   Performed at [facility], [address], [place], [place] [address]  . Ferritin 07/23/2017 39  24 - 336 ng/mL Final   Performed at [facility], [address] [place], [place] [address]    Assessment:  Moroni Nester is an 84 y.o. male with mild cytopenias likely multi-factorial in nature (renal insufficiency and anemia of chronic disease).  He has a 2 year history of a normocytic anemia, thrombocytopenia and a normal white count.    He has a monoclonal gammopathy of unknown significance (MGUS).   Immunoglobulin levels were normal.  24 hour UPEP revealed no monoclonal protein on 09/14/2014  SPEP has been followed: 0.3 gm/dL on 08/15/2014, 0.4 gm.dL on 09/11/2014, 0.3 gm/dL on 05/07/2016, 0.2 gm/dL on 10/09/2016, and 0.3 gm/dL on 04/30/2017.   Kappa free light chains have been followed:  147.5 (ratio 4.56) on 09/11/2014, 153.6 (ratio 3.35) on 05/07/2016, and 182 (ratio 3.28) on 10/09/2016.  Bone survey on 10/03/2014 revealed subtle lucencies within the calvarium, left femoral head, and proximal left tibia which could reflect myelomatous involvement.  Bone survey on 10/03/2015 revealed no definite radiolucent lesions. There was diffuse osteopenia.  There were multiple old healed right lateral rib fractures.  Bone marrow aspirate and biopsy on 10/15/2015 revealed no significant dysplasia or abnormal or overt neoplasia. Marrow was slightly hypercellular for age (40%) with maturing trilineage hematopoiesis, relatively diminished erythroid precursors, and adequate megakaryocytes. There was marginally increased polytypic plasma cells (5%). There was diffuse mild increase in reticulin. There were trace iron stores.   Flow cytometry revealed a low level monoclonal B population (1%) with nonspecific immunophenotype.  Cytogenetics were normal (46, XY) with a normal SNP microarray.   He has chronic renal insufficiency (Cr 1.7 - 2.03) over the past 2 years without trend.  Creatinine was 1.90 (CrCl 24.6 ml/min) on 09/16/2016.  He denies any new medications or herbal products.  Ferritin was 69 on 04/30/2017.  Peripheral smear on 08/27/2014 revealed no abnormal white blood cells, thrombocytopenia without clumping and some large platelets. There were elliptocytes, teardrop red blood cells, and schistocytes. Teardrop RBCs and elliptocytes suggest possible extramedullary hematopoiesis (myelofibrosis).  Myelodysplastic syndrome with deletion 20q was a consideration.  Work-up on 09/11/2014 included CBC with a hematocrit 30.4, hemoglobin 9.7, MCV 95.1, platelets 80,000, white count 5300 with an [place] of 3800. Differential was unremarkable.  B12, folate, TSH, ANA, PT, PTT, LDH.  Retic was 0.7%. Ferritin was 47 with a iron saturation of 20% and a TIBC of 265 (normal).  B12 and folate were normal on 08/17/2016.  Retic was 0.8% on 08/17/2016.  Epo level was 12.9 on 08/17/2016.   He began Procrit every 2 weeks on 10/16/2016 (last 07/09/2017).  Hemoglobin ranges between 9.0 - 9.1.  Diet has been poor until recently.  He is on ferrous sulfate.  He denies any melena or hematochezia.  He has never had a colonoscopy.  Guaiac cards were negative 2 on 09/16/2014.   Symptomatically, he denies any physical complaints today.  His weight is up 3 pounds since  last visit.  Exam is stable.  Hematocrit is 24.9 with a hemoglobin of 8.1 after missing Procrit injections.  Platelet count is 78,000 (declining). Creatinine 2.0 (CrCl 25 ml/min).  Ferritin is 39.  Plan: 1.  Labs today:  CBC with diff, CMP, SPEP, ferritin. 2.  Discuss hemoglobin.  Anticipate improvement with consecutive Procrit injections. 2.  Discuss thrombocytopenia, slightly  progressive.  He is on no new medications or herbal products.  He may have an underlying myelodysplastic syndrome.  We discussed consideration of repeat bone marrow if platelets continue to drop (50,000).  Discussed treatment for MDS.  Patient's brother did not think they would pursue. 4.  Discuss declining renal function.  Consider evaluation by nephrology.  Patient's brother wishes to discuss with Dr. Danise Mina.   5.  Procrit 10,000 units today. 6.  RTC every 2 weeks for labs (Hgb) +/- Procrit. 7.  RTC in 12 weeks (coordinate with above) for MD assessment, labs (CBC with diff, CMP, SPEP, ferritin), and +/- Procrit.   Honor Loh, NP 07/23/2017 , 4:35 PM   I saw and evaluated the patient, participating in the key portions of the service and reviewing pertinent diagnostic studies and records.  I reviewed the nurse practitioner's note and agree with the findings and the plan.  The assessment and plan were discussed with the patient.  Multiple questions were asked by the patient's brother and answered.   Nolon Stalls, MD 07/23/2017, 4:35 PM

## 2017-07-24 ENCOUNTER — Encounter: Payer: Self-pay | Admitting: Hematology and Oncology

## 2017-07-26 DIAGNOSIS — H903 Sensorineural hearing loss, bilateral: Secondary | ICD-10-CM | POA: Diagnosis not present

## 2017-07-26 DIAGNOSIS — H6522 Chronic serous otitis media, left ear: Secondary | ICD-10-CM | POA: Diagnosis not present

## 2017-07-26 DIAGNOSIS — H6123 Impacted cerumen, bilateral: Secondary | ICD-10-CM | POA: Diagnosis not present

## 2017-07-26 DIAGNOSIS — H60393 Other infective otitis externa, bilateral: Secondary | ICD-10-CM | POA: Diagnosis not present

## 2017-07-26 DIAGNOSIS — H6982 Other specified disorders of Eustachian tube, left ear: Secondary | ICD-10-CM | POA: Diagnosis not present

## 2017-07-26 LAB — PROTEIN ELECTROPHORESIS, SERUM
A/G Ratio: 1 (ref 0.7–1.7)
Albumin ELP: 3.1 g/dL (ref 2.9–4.4)
Alpha-1-Globulin: 0.3 g/dL (ref 0.0–0.4)
Alpha-2-Globulin: 0.7 g/dL (ref 0.4–1.0)
Beta Globulin: 0.9 g/dL (ref 0.7–1.3)
Gamma Globulin: 1.1 g/dL (ref 0.4–1.8)
Globulin, Total: 3 g/dL (ref 2.2–3.9)
M-Spike, %: 0.2 g/dL — ABNORMAL HIGH
Total Protein ELP: 6.1 g/dL (ref 6.0–8.5)

## 2017-07-27 ENCOUNTER — Telehealth: Payer: Self-pay | Admitting: *Deleted

## 2017-07-27 NOTE — Telephone Encounter (Signed)
Called patient's brother, [name], and LVM to inform patient that MD recommends he take and OTC oral iron.  Should take it with OJ or vitamin C to help with absorption.

## 2017-07-29 ENCOUNTER — Telehealth: Payer: Self-pay | Admitting: *Deleted

## 2017-07-29 NOTE — Telephone Encounter (Signed)
Called patient 2 days ago regarding lab results.  He was told to take oral iron.  Brother, Richard, called back this morning to let me know patient is already taking Ferrous Sulfate 65.  Advised patient to increase to 2 tablets a day to be taken at separate times with OJ or vitamin C.  Advised him if he cannot tolerate it (GI upset or constipation) to go back to one tablet a day.

## 2017-07-29 NOTE — Telephone Encounter (Signed)
-----   Message from Wilburn Cornelia sent at 07/29/2017  8:18 AM EDT ----- Regarding: returning your call [phone] Richard-(brother)- Rodena Piety, he is returning your call about more iron for pt?

## 2017-08-04 ENCOUNTER — Other Ambulatory Visit: Payer: Self-pay | Admitting: *Deleted

## 2017-08-04 MED ORDER — TRIAMCINOLONE ACETONIDE 0.1 % EX CREA: 1.0000 "application " | TOPICAL_CREAM | Freq: Two times a day (BID) | CUTANEOUS | 0 refills | Status: AC

## 2017-08-04 NOTE — Telephone Encounter (Signed)
Last office visit 07/20/2017.  Last refilled 05/10/2017 for 454 g with no refills by Dr. Lorelei Pont.  Ok to refill?  Dr. Darnell Level out of the office.

## 2017-08-05 DIAGNOSIS — L578 Other skin changes due to chronic exposure to nonionizing radiation: Secondary | ICD-10-CM | POA: Diagnosis not present

## 2017-08-05 DIAGNOSIS — L812 Freckles: Secondary | ICD-10-CM | POA: Diagnosis not present

## 2017-08-05 DIAGNOSIS — L821 Other seborrheic keratosis: Secondary | ICD-10-CM | POA: Diagnosis not present

## 2017-08-05 DIAGNOSIS — L820 Inflamed seborrheic keratosis: Secondary | ICD-10-CM | POA: Diagnosis not present

## 2017-08-05 DIAGNOSIS — L570 Actinic keratosis: Secondary | ICD-10-CM | POA: Diagnosis not present

## 2017-08-06 ENCOUNTER — Inpatient Hospital Stay: Payer: Medicare Other | Attending: Hematology and Oncology

## 2017-08-06 ENCOUNTER — Inpatient Hospital Stay: Payer: Medicare Other

## 2017-08-06 VITALS — BP 153/68 | HR 73

## 2017-08-06 DIAGNOSIS — N184 Chronic kidney disease, stage 4 (severe): Principal | ICD-10-CM

## 2017-08-06 DIAGNOSIS — N183 Chronic kidney disease, stage 3 (moderate): Secondary | ICD-10-CM | POA: Insufficient documentation

## 2017-08-06 DIAGNOSIS — I129 Hypertensive chronic kidney disease with stage 1 through stage 4 chronic kidney disease, or unspecified chronic kidney disease: Secondary | ICD-10-CM | POA: Insufficient documentation

## 2017-08-06 DIAGNOSIS — D631 Anemia in chronic kidney disease: Secondary | ICD-10-CM | POA: Diagnosis not present

## 2017-08-06 LAB — HEMOGLOBIN: Hemoglobin: 7.9 g/dL — ABNORMAL LOW (ref 13.0–18.0)

## 2017-08-06 MED ORDER — EPOETIN ALFA 10000 UNIT/ML IJ SOLN
10000.0000 [IU] | Freq: Once | INTRAMUSCULAR | Status: AC
  Administered 2017-08-06: 10000 [IU] via SUBCUTANEOUS

## 2017-08-11 DIAGNOSIS — H6982 Other specified disorders of Eustachian tube, left ear: Secondary | ICD-10-CM | POA: Diagnosis not present

## 2017-08-11 DIAGNOSIS — J301 Allergic rhinitis due to pollen: Secondary | ICD-10-CM | POA: Diagnosis not present

## 2017-08-11 DIAGNOSIS — H6522 Chronic serous otitis media, left ear: Secondary | ICD-10-CM | POA: Diagnosis not present

## 2017-08-17 ENCOUNTER — Other Ambulatory Visit: Payer: Self-pay

## 2017-08-17 ENCOUNTER — Emergency Department
Admission: EM | Admit: 2017-08-17 | Discharge: 2017-08-17 | Disposition: A | Discharge status: Home / Self Care | Payer: Medicare Other | Attending: Emergency Medicine | Admitting: Emergency Medicine

## 2017-08-17 ENCOUNTER — Encounter: Payer: Self-pay | Admitting: Emergency Medicine

## 2017-08-17 DIAGNOSIS — Z87891 Personal history of nicotine dependence: Secondary | ICD-10-CM | POA: Insufficient documentation

## 2017-08-17 DIAGNOSIS — J449 Chronic obstructive pulmonary disease, unspecified: Secondary | ICD-10-CM | POA: Insufficient documentation

## 2017-08-17 DIAGNOSIS — L03115 Cellulitis of right lower limb: Secondary | ICD-10-CM | POA: Insufficient documentation

## 2017-08-17 DIAGNOSIS — R2242 Localized swelling, mass and lump, left lower limb: Secondary | ICD-10-CM | POA: Diagnosis not present

## 2017-08-17 DIAGNOSIS — N184 Chronic kidney disease, stage 4 (severe): Secondary | ICD-10-CM | POA: Insufficient documentation

## 2017-08-17 DIAGNOSIS — E1122 Type 2 diabetes mellitus with diabetic chronic kidney disease: Secondary | ICD-10-CM | POA: Insufficient documentation

## 2017-08-17 DIAGNOSIS — R2241 Localized swelling, mass and lump, right lower limb: Secondary | ICD-10-CM | POA: Diagnosis present

## 2017-08-17 DIAGNOSIS — Z79899 Other long term (current) drug therapy: Secondary | ICD-10-CM | POA: Insufficient documentation

## 2017-08-17 DIAGNOSIS — Z7901 Long term (current) use of anticoagulants: Secondary | ICD-10-CM | POA: Diagnosis not present

## 2017-08-17 DIAGNOSIS — Z7902 Long term (current) use of antithrombotics/antiplatelets: Secondary | ICD-10-CM | POA: Insufficient documentation

## 2017-08-17 DIAGNOSIS — I129 Hypertensive chronic kidney disease with stage 1 through stage 4 chronic kidney disease, or unspecified chronic kidney disease: Secondary | ICD-10-CM | POA: Diagnosis not present

## 2017-08-17 LAB — BASIC METABOLIC PANEL
ANION GAP: 7 (ref 5–15)
BUN: 92 mg/dL — ABNORMAL HIGH (ref 6–20)
CHLORIDE: 106 mmol/L (ref 101–111)
CO2: 25 mmol/L (ref 22–32)
CREATININE: 1.85 mg/dL — AB (ref 0.61–1.24)
Calcium: 8.8 mg/dL — ABNORMAL LOW (ref 8.9–10.3)
GFR calc non Af Amer: 30 mL/min — ABNORMAL LOW (ref >60)
GFR, EST AFRICAN AMERICAN: 35 mL/min — AB (ref >60)
Glucose, Bld: 173 mg/dL — ABNORMAL HIGH (ref 65–99)
Potassium: 4.2 mmol/L (ref 3.5–5.1)
Sodium: 138 mmol/L (ref 135–145)

## 2017-08-17 LAB — CBC
HEMATOCRIT: 25.1 % — AB (ref 40.0–52.0)
HEMOGLOBIN: 8.1 g/dL — AB (ref 13.0–18.0)
MCH: 29.1 pg (ref 26.0–34.0)
MCHC: 32.4 g/dL (ref 32.0–36.0)
MCV: 89.7 fL (ref 80.0–100.0)
Platelets: 85 10*3/uL — ABNORMAL LOW (ref 150–440)
RBC: 2.79 MIL/uL — ABNORMAL LOW (ref 4.40–5.90)
RDW: 15.2 % — ABNORMAL HIGH (ref 11.5–14.5)
WBC: 6.8 10*3/uL (ref 3.8–10.6)

## 2017-08-17 MED ORDER — CEPHALEXIN 500 MG PO CAPS: 500.0000 mg | ORAL_CAPSULE | Freq: Four times a day (QID) | ORAL | 0 refills | Status: DC

## 2017-08-17 MED ORDER — DOXYCYCLINE HYCLATE 100 MG PO CAPS: ORAL_CAPSULE | ORAL | 0 refills | Status: DC

## 2017-08-17 MED ORDER — CEPHALEXIN 500 MG PO CAPS
500.0000 mg | ORAL_CAPSULE | Freq: Once | ORAL | Status: AC
  Administered 2017-08-17: 500 mg via ORAL
  Filled 2017-08-17: qty 1

## 2017-08-17 MED ORDER — CEPHALEXIN 500 MG PO CAPS: 500.0000 mg | ORAL_CAPSULE | Freq: Two times a day (BID) | ORAL | 0 refills | Status: DC

## 2017-08-17 MED ORDER — DOXYCYCLINE HYCLATE 100 MG PO TABS
100.0000 mg | ORAL_TABLET | Freq: Once | ORAL | Status: AC
  Administered 2017-08-17: 100 mg via ORAL
  Filled 2017-08-17: qty 1

## 2017-08-17 NOTE — ED Notes (Signed)
Pt niece has picture of wound/ leg from 1721 on 5/20 showing redness.  Pt leg marked using skin marker at that region to show how the redness is extending up th leg. Redness has extended 7cm up the leg within the last 24 hours

## 2017-08-17 NOTE — ED Provider Notes (Addendum)
[facility] Emergency Department Provider Note  ____________________________________________   First MD Initiated Contact with Patient 08/17/17 1616     (approximate)  I have reviewed the triage vital signs and the nursing notes.   HISTORY  Chief Complaint Leg Swelling  History is limited by the patient being severely hard of hearing.  Most history is provided by family.  HPI Charles Rodgers is a 84 y.o. male with medical history as listed below which notably includes chronic kidney disease.  He presents with his niece and brother for evaluation of gradually worsening redness and some swelling of his distal anterior right lower leg.  He has a small dot as if he had a break in the skin which looks like it is healing but now the redness is extending up from that area.  It is not causing severe pain but it is a little bit tender and it is more swollen than the left although he does have chronic edema in his left lower extremity as well.  There is no report that he has had any fever, chills, chest pain, shortness of breath, nausea, vomiting, nor abdominal pain.  He lives by himself and his niece and brother have been looking after him since his wife passed away about 4 or 5 months ago.  He has been on prednisone recently for an ear effusion and just went off of the prednisone yesterday.  He denies any other symptoms or concerns at this time.   Past Medical History:  Diagnosis Date  . Abscess of axilla, left 02/07/2015  . Arthritis   . BPH (benign prostatic hypertrophy)    with elevated PSA, s/p benign biopsy, followed yearly by [facility] (last seen 01/16/2013)  . CKD (chronic kidney disease) stage 3, GFR 30-59 ml/min ([place])    [place] -> 2017 requested PCP start following  . COPD (chronic obstructive pulmonary disease) ([place]) 2016   by xray  . Diabetes mellitus type 2, controlled ([place])   . Glaucoma   . Hearing loss   . HLD (hyperlipidemia)   . HTN  (hypertension)   . IgM monoclonal gammopathy of uncertain significance 08/27/2014   07/2014 suggest rpt 6 mo.  . Malignant melanoma of left upper arm ([place]) 11/14/2014   s/p excision complicated by infected axillary seroma (Dr Carmin Muskrat)  . Melanoma ([place]) 2014   nose and left hand  . Secondary hyperparathyroidism ([place])   . Subclavian artery stenosis, right ([place]) 2014   found on carotid US, seen [facility], recommended recheck 1 yr at vascular lab with Dr. Delana Meyer  . Thyroid nodule 02/2013   right upper lobe - s/p biopsy - benign follicular nodule  . Urine incontinence     Patient Active Problem List   Diagnosis Date Noted  . Goals of care, counseling/discussion 07/23/2017  . Cerumen impaction 07/20/2017  . Nasal sinus congestion 07/20/2017  . Dilated cardiomyopathy ([place]) 07/20/2017  . Grieving 04/22/2017  . Encounter for therapeutic drug monitoring 11/24/2016  . Vitamin D deficiency 09/23/2016  . Atrial fibrillation with slow ventricular response ([place]) 10/24/2015  . Pedal edema 03/04/2015  . Recurrent falls 03/04/2015  . History of malignant melanoma of skin 11/19/2014  . Melanoma of upper arm ([place]) 11/14/2014  . Monoclonal gammopathy of unknown significance (MGUS) 08/27/2014  . Advanced care planning/counseling discussion 02/09/2014  . Health maintenance examination 02/09/2014  . Elevated prostate specific antigen (PSA) 01/13/2013  . Right thyroid nodule   . Subclavian artery stenosis, right ([place])   .  Medicare annual wellness visit, subsequent 05/18/2012  . Thrombocytopenia ([place]) 05/18/2012  . Anemia in chronic kidney disease 05/18/2012  . Glaucoma   . HOH (hard of hearing)   . Enlarged prostate with lower urinary tract symptoms (LUTS)   . Moderate mitral valve regurgitation 09/10/2011  . Controlled type 2 diabetes mellitus with diabetic nephropathy, without long-term current use of insulin ([place])   . HTN (hypertension)   . Hyperlipidemia   . CKD stage 4 due to type 2 diabetes  [facility])     Past Surgical History:  Procedure Laterality Date  . APPENDECTOMY  1938  . arm surgery     Right after trauma  . BIOPSY THYROID Right [id number]/6503   benign follicular nodule  . carotid US  07/2007   WNL  . 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 US ECHOCARDIOGRAPHY  06/2007   EF 55%, mod dil LA, mild LVH,mild pulm HTN    Prior to Admission medications   Medication Sig Start Date End Date Taking? Authorizing Provider  amLODipine (NORVASC) 10 MG tablet Take 1 tablet (10 mg total) by mouth daily. 06/14/17  Yes [name], MD  apixaban (ELIQUIS) 2.5 MG TABS tablet Take 1 tablet (2.5 mg total) by mouth 2 (two) times daily. 04/22/17  Yes [name], MD  Azelastine HCl 137 MCG/SPRAY SOLN Place 1-2 sprays into both nostrils as directed. 08/11/17  Yes [provider]  Casanthranol-Docusate Sodium 30-100 MG CAPS Take 1 capsule by mouth as directed.    Yes [provider]  Cholecalciferol (VITAMIN D) 2000 units CAPS Take 1 capsule (2,000 Units total) by mouth daily. 06/14/17  Yes [name], MD  dorzolamide-timolol (COSOPT) 22.3-6.8 MG/ML ophthalmic solution Place 1 drop into both eyes 2 (two) times daily. 02/24/16  Yes [name], MD  doxazosin (CARDURA) 8 MG tablet TAKE 1 TABLET BY MOUTH AT BEDTIME 05/14/17  Yes [name], MD  ferrous sulfate 325 (65 FE) MG tablet TAKE 1 TABLET BY MOUTH ONCE A DAY WITH BREAKFAST 01/22/17  Yes Corcoran, Melissa C, MD  fluticasone (FLONASE) 50 MCG/ACT nasal spray Place 2 sprays into both nostrils daily. 07/20/17  Yes [name], MD  furosemide (LASIX) 40 MG tablet Take 1 tablet (40 mg total) by mouth 2 (two) times daily. 06/14/17  Yes [name], MD  glucose blood (ONE TOUCH ULTRA TEST) test strip USE TO CHECK BLOOD SUGAR 3 TIMES DAILY Dx: E11.42 11/21/15  Yes [name], MD  latanoprost (XALATAN) 0.005 % ophthalmic solution Place 1 drop into  both eyes at bedtime. 02/24/16  Yes [name], MD  lisinopril (PRINIVIL,ZESTRIL) 20 MG tablet Take 1 tablet (20 mg total) by mouth 2 (two) times daily. 05/24/17  Yes [name], MD  loratadine (CLARITIN) 10 MG tablet Take 1 tablet (10 mg total) by mouth daily. 07/20/17  Yes [name], MD  Multiple Vitamins-Minerals (PRESERVISION AREDS) CAPS Take 2 capsules by mouth daily.   Yes [provider]  pravastatin (PRAVACHOL) 40 MG tablet TAKE 1 TABLET BY MOUTH AT BEDTIME 05/14/17  Yes [name], MD  spironolactone (ALDACTONE) 25 MG tablet Take 0.5 tablets (12.5 mg total) by mouth every other day. 06/14/17  Yes [name], MD  triamcinolone cream (KENALOG) 0.1 % Apply 1 application topically 2 (two) times daily. 08/04/17  Yes Copland, Frederico Hamman, MD  cephALEXin (KEFLEX) 500 MG capsule Take 1 capsule (500 mg total) by mouth 2 (two) times daily for 10 days. 08/17/17 08/27/17  Hinda Kehr, MD  doxycycline (VIBRAMYCIN) 100 MG capsule Take 1 capsule (100 mg) by mouth twice daily for 10 days. 08/17/17   Hinda Kehr, MD    Allergies Patient has no known allergies.  Family History  Problem Relation Age of Onset  . Cancer Mother        stomach  . Cancer Brother        colon  . Cancer Brother        colon  . Cancer Sister        hodgkin's lymphoma  . Cancer Brother        prostate  . Diabetes Brother   . Coronary artery disease Neg Hx   . Stroke Neg Hx     Social History Social History   Tobacco Use  . Smoking status: Former Smoker    Packs/day: 0.50    Years: 15.00    Pack years: 7.50    Types: Cigarettes    Last attempt to quit: 03/31/1967    Years since quitting: 50.4  . Smokeless tobacco: Never Used  Substance Use Topics  . Alcohol use: No  . Drug use: No    Review of Systems History is limited by the patient being severely hard of hearing.  Most history is provided by family.  Constitutional: No fever/chills Eyes: No visual changes. ENT: No  sore throat. Cardiovascular: Denies chest pain. Respiratory: Denies shortness of breath. Gastrointestinal: No abdominal pain.  No nausea, no vomiting.  No diarrhea.  No constipation. Genitourinary: Negative for dysuria. Musculoskeletal: Swelling and redness in distal RLE. Negative for neck pain.  Negative for back pain. Integumentary: Swelling and redness in distal RLE.   Neurological: Negative for headaches, focal weakness or numbness.   ____________________________________________   PHYSICAL EXAM:  VITAL SIGNS: ED Triage Vitals  Enc Vitals Group     BP 08/17/17 1326 (!) 139/45     Pulse Rate 08/17/17 1326 (!) 58     Resp 08/17/17 1326 15     Temp 08/17/17 1326 98.2 F (36.8 C)     Temp Source 08/17/17 1326 Oral     SpO2 08/17/17 1326 100 %     Weight 08/17/17 1328 81.6 kg (180 lb)     Height 08/17/17 1328 1.727 m (5\' 8" )     Head Circumference --      Peak Flow --      Pain Score 08/17/17 1326 4     Pain Loc --      Pain Edu? --      Excl. in [place]? --     Constitutional: Alert and oriented. Well appearing and in no acute distress. Eyes: Conjunctivae are normal.  Head: Atraumatic. Nose: No congestion/rhinnorhea. Mouth/Throat: Mucous membranes are moist. Neck: No stridor.  No meningeal signs.   Cardiovascular: Normal rate, regular rhythm. Good peripheral circulation. Grossly normal heart sounds. Respiratory: Normal respiratory effort.  No retractions. Lungs CTAB. Gastrointestinal: Soft and nontender. No distention.  Musculoskeletal: No lower extremity tenderness nor edema. No gross deformities of extremities. Neurologic:  Severely HOH. Normal speech and language. No gross focal neurologic deficits are appreciated except for hearing loss. Skin: There is a small lesion at the distal anterior right lower extremity that appears like a was the initial nidus of infection.  There is no induration nor abscess but there is about 7 cm of erythema and warmth consistent with  cellulitis extending proximally from that lesion.  The foot is not involved.  Skin markers have been used to  demarcate the area that was involved as of yesterday and then again today at triage.  It is non-circumferential.  It is not tender.  Compartments are soft.  There is 1+ pitting edema bilaterally, right greater than left. Psychiatric: Mood and affect are normal. Speech and behavior are normal.  ____________________________________________   LABS (all labs ordered are listed, but only abnormal results are displayed)  Labs Reviewed  BASIC METABOLIC PANEL - Abnormal; Notable for the following components:      Result Value   Glucose, Bld 173 (*)    BUN 92 (*)    Creatinine, Ser 1.85 (*)    Calcium 8.8 (*)    GFR calc non Af Amer 30 (*)    GFR calc Af Amer 35 (*)    All other components within normal limits  CBC - Abnormal; Notable for the following components:   RBC 2.79 (*)    Hemoglobin 8.1 (*)    HCT 25.1 (*)    RDW 15.2 (*)    Platelets 85 (*)    All other components within normal limits   ____________________________________________  EKG  None - EKG not ordered by ED physician ____________________________________________  RADIOLOGY   ED MD interpretation: No indication for imaging  Official radiology report(s): No results found.  ____________________________________________   PROCEDURES  Critical Care performed: No   Procedure(s) performed:   Procedures   ____________________________________________   INITIAL IMPRESSION / ASSESSMENT AND PLAN / ED COURSE  As part of my medical decision making, I reviewed the following data within the [place] History obtained from family, Nursing notes reviewed and incorporated and Labs reviewed     Differential diagnosis includes, but is not limited to, cellulitis, lymphedema, DVT, necrotizing fasciitis, abscess.  The patient is in no acute distress.  His vital signs are all within normal  limits.  His lab work is within normal limits or is at least baseline for him (such as his creatinine).  He has no leukocytosis and no fever.  He has not been on antibiotics recently and he has recently been on steroids.  I had a discussion with his family regarding disposition.  I explained that for relatively confined area of cellulitis it is reasonable to try outpatient treatment first.  Particularly given that he meets no criteria for sepsis, I am concerned that at his age and with his degree of hearing impairment it would be very disruptive and disorienting to him to be admitted or observed inpatient with IV antibiotics and would likely lead to a worse outcome.  Since he has good family support who can make sure he has access to antibiotics and follow-up care, I think this is the best disposition plan for him, and the family agrees.  His brother is made the comment that he will not stay in the hospital regardless, and they are in full support and agree with the plan.  This is a relatively localized anterior right lower extremity cellulitis and he does not need an ultrasound; there is no suggestion of DVT based on the presentation.  Pain is not out of proportion and is in fact mild and I am not concerned about necrotizing fasciitis.  I gave my usual and customary return precautions.  Family agrees with the plan.  I am prescribing Keflex 500 mg by mouth 4 times daily for 10 days and doxycycline 100 mg by mouth twice daily for 10 days.  I chose doxycycline for MRSA coverage as well as because he spends  a great deal of time outside and I felt like this was appropriate for possible tick exposure as well, and better tolerated in the elderly population and Bactrim for MRSA.  Clinical Course as of Aug 18 1702  Tue Aug 17, 2017  1700 Of note, because it is close to the time for the pharmacy to close, I am giving him a first dose of both Keflex and doxycycline prior to discharge.  I also checked the dosing for the  Keflex; the patient's creatinine clearance calculates out to 31, which means that there are no specific manufacturer dosage change recommendations, however it does recommend no more than 1000 mg/day.  As a result I will give him 500 mg tonight as an initial dose but I will change his prescription to 500 mg 2 times a day for consistent coverage that does not exceed the manufacturer recommendation.  I considered changing to 250 mg QID, but I am afraid the difficulty dosing schedule will lead to decreased adherence to the regimen, and twice daily dosing should provide appropriate (maximal) dosing.   [CF]    Clinical Course User Index [CF] Hinda Kehr, MD    ____________________________________________  FINAL CLINICAL IMPRESSION(S) / ED DIAGNOSES  Final diagnoses:  Cellulitis of right anterior lower leg     MEDICATIONS GIVEN DURING THIS VISIT:  Medications  cephALEXin (KEFLEX) capsule 500 mg (has no administration in time range)  doxycycline (VIBRA-TABS) tablet 100 mg (has no administration in time range)     ED Discharge Orders        Ordered    cephALEXin (KEFLEX) 500 MG capsule  4 times daily,   Status:  Discontinued     08/17/17 1647    doxycycline (VIBRAMYCIN) 100 MG capsule  Status:  Discontinued     08/17/17 1647    doxycycline (VIBRAMYCIN) 100 MG capsule     08/17/17 1703    cephALEXin (KEFLEX) 500 MG capsule  2 times daily     08/17/17 1703       Note:  This document was prepared using Dragon voice recognition software and may include unintentional dictation errors.    Hinda Kehr, MD 08/17/17 1658    Hinda Kehr, MD 08/17/17 1704

## 2017-08-17 NOTE — ED Notes (Signed)
NAD noted at time of D/C. Pt's caregiver and family member denies questions or concerns. Pt taken to the lobby via wheelchair at this time.

## 2017-08-17 NOTE — Discharge Instructions (Addendum)
You have been seen today in the Emergency Department (ED) for cellulitis, a superficial skin infection. Please take your antibiotics as prescribed for their ENTIRE prescribed duration.  Take Tylenol or Motrin as needed for pain, but only as written on the box.   Please follow up with your doctor in about 3 days for recheck of your infection if you are not improving.  Call your doctor sooner or return to the ED if you develop worsening signs of infection such as: increased redness, increased pain, pus, fever, or other symptoms that concern you.

## 2017-08-17 NOTE — ED Triage Notes (Signed)
Swelling in legs and was using cream.  Now the left lower leg is reddened.

## 2017-08-18 ENCOUNTER — Ambulatory Visit: Payer: Medicare Other | Admitting: Family Medicine

## 2017-08-20 ENCOUNTER — Inpatient Hospital Stay: Payer: Medicare Other

## 2017-08-20 ENCOUNTER — Telehealth: Payer: Self-pay | Admitting: *Deleted

## 2017-08-20 NOTE — Telephone Encounter (Signed)
Returned call from patient's brother [name]) he called to have 08/20/17 appts for Lab/INJ  R/S due to patient having a fall, and was unable to come today.Patient's  08/20/17 appts was R/S to 08/24/17.

## 2017-08-24 ENCOUNTER — Inpatient Hospital Stay: Payer: Medicare Other

## 2017-08-24 ENCOUNTER — Encounter: Payer: Self-pay | Admitting: Emergency Medicine

## 2017-08-24 ENCOUNTER — Ambulatory Visit (INDEPENDENT_AMBULATORY_CARE_PROVIDER_SITE_OTHER): Payer: Medicare Other | Admitting: Family Medicine

## 2017-08-24 ENCOUNTER — Emergency Department: Payer: Medicare Other

## 2017-08-24 ENCOUNTER — Inpatient Hospital Stay
Admission: EM | Admit: 2017-08-24 | Discharge: 2017-08-26 | DRG: 300 | Disposition: A | Discharge status: Home Health | Payer: Medicare Other | Attending: Internal Medicine | Admitting: Internal Medicine

## 2017-08-24 ENCOUNTER — Encounter: Payer: Self-pay | Admitting: Family Medicine

## 2017-08-24 ENCOUNTER — Other Ambulatory Visit: Payer: Self-pay

## 2017-08-24 VITALS — BP 136/64 | HR 62 | Temp 97.8°F | Ht 67.0 in | Wt 187.5 lb

## 2017-08-24 DIAGNOSIS — M1990 Unspecified osteoarthritis, unspecified site: Secondary | ICD-10-CM | POA: Diagnosis present

## 2017-08-24 DIAGNOSIS — H409 Unspecified glaucoma: Secondary | ICD-10-CM | POA: Diagnosis present

## 2017-08-24 DIAGNOSIS — W19XXXA Unspecified fall, initial encounter: Secondary | ICD-10-CM | POA: Diagnosis present

## 2017-08-24 DIAGNOSIS — M25552 Pain in left hip: Secondary | ICD-10-CM | POA: Diagnosis not present

## 2017-08-24 DIAGNOSIS — R600 Localized edema: Secondary | ICD-10-CM

## 2017-08-24 DIAGNOSIS — Z7952 Long term (current) use of systemic steroids: Secondary | ICD-10-CM

## 2017-08-24 DIAGNOSIS — Z7901 Long term (current) use of anticoagulants: Secondary | ICD-10-CM

## 2017-08-24 DIAGNOSIS — L03115 Cellulitis of right lower limb: Secondary | ICD-10-CM | POA: Diagnosis present

## 2017-08-24 DIAGNOSIS — Z79899 Other long term (current) drug therapy: Secondary | ICD-10-CM

## 2017-08-24 DIAGNOSIS — E1121 Type 2 diabetes mellitus with diabetic nephropathy: Secondary | ICD-10-CM | POA: Diagnosis present

## 2017-08-24 DIAGNOSIS — I5032 Chronic diastolic (congestive) heart failure: Secondary | ICD-10-CM | POA: Diagnosis not present

## 2017-08-24 DIAGNOSIS — E1122 Type 2 diabetes mellitus with diabetic chronic kidney disease: Secondary | ICD-10-CM | POA: Diagnosis not present

## 2017-08-24 DIAGNOSIS — I4891 Unspecified atrial fibrillation: Secondary | ICD-10-CM | POA: Diagnosis not present

## 2017-08-24 DIAGNOSIS — I130 Hypertensive heart and chronic kidney disease with heart failure and stage 1 through stage 4 chronic kidney disease, or unspecified chronic kidney disease: Secondary | ICD-10-CM | POA: Diagnosis not present

## 2017-08-24 DIAGNOSIS — R51 Headache: Secondary | ICD-10-CM | POA: Diagnosis not present

## 2017-08-24 DIAGNOSIS — N400 Enlarged prostate without lower urinary tract symptoms: Secondary | ICD-10-CM | POA: Diagnosis not present

## 2017-08-24 DIAGNOSIS — Z87891 Personal history of nicotine dependence: Secondary | ICD-10-CM

## 2017-08-24 DIAGNOSIS — I482 Chronic atrial fibrillation: Secondary | ICD-10-CM | POA: Diagnosis not present

## 2017-08-24 DIAGNOSIS — N183 Chronic kidney disease, stage 3 (moderate): Secondary | ICD-10-CM | POA: Diagnosis not present

## 2017-08-24 DIAGNOSIS — I340 Nonrheumatic mitral (valve) insufficiency: Secondary | ICD-10-CM | POA: Diagnosis not present

## 2017-08-24 DIAGNOSIS — Z8042 Family history of malignant neoplasm of prostate: Secondary | ICD-10-CM

## 2017-08-24 DIAGNOSIS — J449 Chronic obstructive pulmonary disease, unspecified: Secondary | ICD-10-CM | POA: Diagnosis not present

## 2017-08-24 DIAGNOSIS — I420 Dilated cardiomyopathy: Secondary | ICD-10-CM | POA: Diagnosis present

## 2017-08-24 DIAGNOSIS — Z833 Family history of diabetes mellitus: Secondary | ICD-10-CM

## 2017-08-24 DIAGNOSIS — N289 Disorder of kidney and ureter, unspecified: Secondary | ICD-10-CM

## 2017-08-24 DIAGNOSIS — S79912A Unspecified injury of left hip, initial encounter: Secondary | ICD-10-CM | POA: Diagnosis not present

## 2017-08-24 DIAGNOSIS — L02415 Cutaneous abscess of right lower limb: Secondary | ICD-10-CM

## 2017-08-24 DIAGNOSIS — E785 Hyperlipidemia, unspecified: Secondary | ICD-10-CM | POA: Diagnosis present

## 2017-08-24 DIAGNOSIS — E1151 Type 2 diabetes mellitus with diabetic peripheral angiopathy without gangrene: Secondary | ICD-10-CM | POA: Diagnosis present

## 2017-08-24 DIAGNOSIS — N189 Chronic kidney disease, unspecified: Secondary | ICD-10-CM

## 2017-08-24 DIAGNOSIS — N2581 Secondary hyperparathyroidism of renal origin: Secondary | ICD-10-CM | POA: Diagnosis present

## 2017-08-24 DIAGNOSIS — Z807 Family history of other malignant neoplasms of lymphoid, hematopoietic and related tissues: Secondary | ICD-10-CM

## 2017-08-24 DIAGNOSIS — N184 Chronic kidney disease, stage 4 (severe): Secondary | ICD-10-CM | POA: Diagnosis not present

## 2017-08-24 DIAGNOSIS — I129 Hypertensive chronic kidney disease with stage 1 through stage 4 chronic kidney disease, or unspecified chronic kidney disease: Secondary | ICD-10-CM | POA: Diagnosis not present

## 2017-08-24 DIAGNOSIS — D509 Iron deficiency anemia, unspecified: Secondary | ICD-10-CM | POA: Diagnosis not present

## 2017-08-24 DIAGNOSIS — R296 Repeated falls: Secondary | ICD-10-CM

## 2017-08-24 DIAGNOSIS — H9190 Unspecified hearing loss, unspecified ear: Secondary | ICD-10-CM | POA: Diagnosis not present

## 2017-08-24 DIAGNOSIS — I82491 Acute embolism and thrombosis of other specified deep vein of right lower extremity: Secondary | ICD-10-CM | POA: Diagnosis not present

## 2017-08-24 DIAGNOSIS — Z85820 Personal history of malignant melanoma of skin: Secondary | ICD-10-CM | POA: Diagnosis not present

## 2017-08-24 DIAGNOSIS — N179 Acute kidney failure, unspecified: Secondary | ICD-10-CM | POA: Diagnosis not present

## 2017-08-24 DIAGNOSIS — I82401 Acute embolism and thrombosis of unspecified deep veins of right lower extremity: Secondary | ICD-10-CM | POA: Diagnosis not present

## 2017-08-24 DIAGNOSIS — I272 Pulmonary hypertension, unspecified: Secondary | ICD-10-CM | POA: Diagnosis present

## 2017-08-24 DIAGNOSIS — D631 Anemia in chronic kidney disease: Secondary | ICD-10-CM | POA: Diagnosis present

## 2017-08-24 DIAGNOSIS — M25551 Pain in right hip: Secondary | ICD-10-CM | POA: Diagnosis not present

## 2017-08-24 DIAGNOSIS — Z800 Family history of malignant neoplasm of digestive organs: Secondary | ICD-10-CM

## 2017-08-24 DIAGNOSIS — S79911A Unspecified injury of right hip, initial encounter: Secondary | ICD-10-CM | POA: Diagnosis not present

## 2017-08-24 HISTORY — DX: Cellulitis of right lower limb: L03.115

## 2017-08-24 LAB — COMPREHENSIVE METABOLIC PANEL
ALBUMIN: 3 g/dL — AB (ref 3.5–5.0)
ALK PHOS: 60 U/L (ref 38–126)
ALT: 15 U/L — ABNORMAL LOW (ref 17–63)
ANION GAP: 11 (ref 5–15)
AST: 19 U/L (ref 15–41)
BILIRUBIN TOTAL: 0.5 mg/dL (ref 0.3–1.2)
BUN: 97 mg/dL — ABNORMAL HIGH (ref 6–20)
CALCIUM: 8.6 mg/dL — AB (ref 8.9–10.3)
CO2: 20 mmol/L — ABNORMAL LOW (ref 22–32)
Chloride: 106 mmol/L (ref 101–111)
Creatinine, Ser: 2.08 mg/dL — ABNORMAL HIGH (ref 0.61–1.24)
GFR calc Af Amer: 31 mL/min — ABNORMAL LOW (ref >60)
GFR, EST NON AFRICAN AMERICAN: 26 mL/min — AB (ref >60)
GLUCOSE: 152 mg/dL — AB (ref 65–99)
POTASSIUM: 4.2 mmol/L (ref 3.5–5.1)
Sodium: 137 mmol/L (ref 135–145)
TOTAL PROTEIN: 6.5 g/dL (ref 6.5–8.1)

## 2017-08-24 LAB — CBC WITH DIFFERENTIAL/PLATELET
BASOS ABS: 0 10*3/uL (ref 0–0.1)
Basophils Relative: 1 %
EOS PCT: 1 %
Eosinophils Absolute: 0.1 10*3/uL (ref 0–0.7)
HEMATOCRIT: 23.5 % — AB (ref 40.0–52.0)
Hemoglobin: 7.5 g/dL — ABNORMAL LOW (ref 13.0–18.0)
Lymphocytes Relative: 7 %
Lymphs Abs: 0.4 10*3/uL — ABNORMAL LOW (ref 1.0–3.6)
MCH: 28.8 pg (ref 26.0–34.0)
MCHC: 32 g/dL (ref 32.0–36.0)
MCV: 90 fL (ref 80.0–100.0)
MONO ABS: 0.5 10*3/uL (ref 0.2–1.0)
Monocytes Relative: 10 %
NEUTROS ABS: 4.2 10*3/uL (ref 1.4–6.5)
Neutrophils Relative %: 81 %
PLATELETS: 128 10*3/uL — AB (ref 150–440)
RBC: 2.62 MIL/uL — AB (ref 4.40–5.90)
RDW: 14.9 % — AB (ref 11.5–14.5)
WBC: 5.2 10*3/uL (ref 3.8–10.6)

## 2017-08-24 LAB — LACTIC ACID, PLASMA
LACTIC ACID, VENOUS: 0.7 mmol/L (ref 0.5–1.9)
Lactic Acid, Venous: 0.9 mmol/L (ref 0.5–1.9)

## 2017-08-24 LAB — APTT: APTT: 53 s — AB (ref 24–36)

## 2017-08-24 LAB — HEPARIN LEVEL (UNFRACTIONATED): HEPARIN UNFRACTIONATED: 1.75 [IU]/mL — AB (ref 0.30–0.70)

## 2017-08-24 LAB — PROTIME-INR
INR: 1.75
Prothrombin Time: 20.3 seconds — ABNORMAL HIGH (ref 11.4–15.2)

## 2017-08-24 MED ORDER — VANCOMYCIN HCL 10 G IV SOLR
1750.0000 mg | Freq: Once | INTRAVENOUS | Status: AC
  Administered 2017-08-24: 1750 mg via INTRAVENOUS
  Filled 2017-08-24: qty 1750

## 2017-08-24 MED ORDER — BISACODYL 5 MG PO TBEC: 5.0000 mg | DELAYED_RELEASE_TABLET | Freq: Every day | ORAL | Status: DC | PRN

## 2017-08-24 MED ORDER — FERROUS SULFATE 325 (65 FE) MG PO TABS
325.0000 mg | ORAL_TABLET | Freq: Every day | ORAL | Status: DC
  Administered 2017-08-25 – 2017-08-26 (×2): 325 mg via ORAL
  Filled 2017-08-24 (×2): qty 1

## 2017-08-24 MED ORDER — SODIUM CHLORIDE 0.9 % IV BOLUS
1000.0000 mL | Freq: Once | INTRAVENOUS | Status: AC
  Administered 2017-08-24: 1000 mL via INTRAVENOUS

## 2017-08-24 MED ORDER — DOCUSATE SODIUM 100 MG PO CAPS: 100.0000 mg | ORAL_CAPSULE | Freq: Two times a day (BID) | ORAL | Status: DC

## 2017-08-24 MED ORDER — FUROSEMIDE 40 MG PO TABS
40.0000 mg | ORAL_TABLET | Freq: Two times a day (BID) | ORAL | Status: DC
  Administered 2017-08-25 – 2017-08-26 (×3): 40 mg via ORAL
  Filled 2017-08-24 (×3): qty 1

## 2017-08-24 MED ORDER — DOXYCYCLINE HYCLATE 100 MG PO TABS
100.0000 mg | ORAL_TABLET | Freq: Two times a day (BID) | ORAL | Status: DC
  Administered 2017-08-24: 100 mg via ORAL
  Filled 2017-08-24: qty 1

## 2017-08-24 MED ORDER — DORZOLAMIDE HCL-TIMOLOL MAL 2-0.5 % OP SOLN
1.0000 [drp] | Freq: Two times a day (BID) | OPHTHALMIC | Status: DC
Start: 2017-08-24 — End: 2017-08-26
  Administered 2017-08-24 – 2017-08-26 (×4): 1 [drp] via OPHTHALMIC
  Filled 2017-08-24: qty 10

## 2017-08-24 MED ORDER — ONDANSETRON HCL 4 MG PO TABS: 4.0000 mg | ORAL_TABLET | Freq: Four times a day (QID) | ORAL | Status: DC | PRN

## 2017-08-24 MED ORDER — TRAZODONE HCL 50 MG PO TABS: 25.0000 mg | ORAL_TABLET | Freq: Every evening | ORAL | Status: DC | PRN

## 2017-08-24 MED ORDER — HEPARIN (PORCINE) IN NACL 100-0.45 UNIT/ML-% IJ SOLN
1350.0000 [IU]/h | INTRAMUSCULAR | Status: DC
  Administered 2017-08-24: 1350 [IU]/h via INTRAVENOUS
  Filled 2017-08-24: qty 250

## 2017-08-24 MED ORDER — ONDANSETRON HCL 4 MG/2ML IJ SOLN: 4.0000 mg | Freq: Four times a day (QID) | INTRAMUSCULAR | Status: DC | PRN

## 2017-08-24 MED ORDER — AZELASTINE HCL 0.1 % NA SOLN
1.0000 | Freq: Every day | NASAL | Status: DC
  Administered 2017-08-24 – 2017-08-25 (×2): 1 via NASAL
  Filled 2017-08-24: qty 30

## 2017-08-24 MED ORDER — APIXABAN 2.5 MG PO TABS: 2.5000 mg | ORAL_TABLET | Freq: Two times a day (BID) | ORAL | Status: DC

## 2017-08-24 MED ORDER — PSYLLIUM 95 % PO PACK
1.0000 | PACK | Freq: Every day | ORAL | Status: DC
  Administered 2017-08-24 – 2017-08-26 (×3): 1 via ORAL
  Filled 2017-08-24 (×3): qty 1

## 2017-08-24 MED ORDER — LISINOPRIL 20 MG PO TABS
20.0000 mg | ORAL_TABLET | Freq: Two times a day (BID) | ORAL | Status: DC
  Administered 2017-08-24 – 2017-08-26 (×4): 20 mg via ORAL
  Filled 2017-08-24 (×4): qty 1

## 2017-08-24 MED ORDER — DOXAZOSIN MESYLATE 8 MG PO TABS
8.0000 mg | ORAL_TABLET | Freq: Every day | ORAL | Status: DC
  Administered 2017-08-24 – 2017-08-25 (×2): 8 mg via ORAL
  Filled 2017-08-24 (×3): qty 1

## 2017-08-24 MED ORDER — ACETAMINOPHEN 325 MG PO TABS
650.0000 mg | ORAL_TABLET | Freq: Four times a day (QID) | ORAL | Status: DC | PRN
  Administered 2017-08-25 (×3): 650 mg via ORAL
  Filled 2017-08-24 (×3): qty 2

## 2017-08-24 MED ORDER — HEPARIN BOLUS VIA INFUSION
4500.0000 [IU] | Freq: Once | INTRAVENOUS | Status: AC
  Administered 2017-08-24: 4500 [IU] via INTRAVENOUS
  Filled 2017-08-24: qty 4500

## 2017-08-24 MED ORDER — CEFAZOLIN SODIUM-DEXTROSE 1-4 GM/50ML-% IV SOLN
1.0000 g | Freq: Two times a day (BID) | INTRAVENOUS | Status: DC
  Administered 2017-08-24: 1 g via INTRAVENOUS
  Filled 2017-08-24 (×3): qty 50

## 2017-08-24 MED ORDER — ACETAMINOPHEN 650 MG RE SUPP: 650.0000 mg | Freq: Four times a day (QID) | RECTAL | Status: DC | PRN

## 2017-08-24 MED ORDER — SPIRONOLACTONE 25 MG PO TABS
12.5000 mg | ORAL_TABLET | ORAL | Status: DC
  Administered 2017-08-26: 12.5 mg via ORAL
  Filled 2017-08-24: qty 0.5
  Filled 2017-08-24: qty 1

## 2017-08-24 MED ORDER — LORATADINE 10 MG PO TABS
10.0000 mg | ORAL_TABLET | Freq: Every day | ORAL | Status: DC
  Administered 2017-08-25 – 2017-08-26 (×2): 10 mg via ORAL
  Filled 2017-08-24 (×2): qty 1

## 2017-08-24 MED ORDER — AMLODIPINE BESYLATE 5 MG PO TABS
10.0000 mg | ORAL_TABLET | Freq: Every day | ORAL | Status: DC
  Administered 2017-08-25 – 2017-08-26 (×2): 10 mg via ORAL
  Filled 2017-08-24 (×2): qty 2

## 2017-08-24 MED ORDER — DOCUSATE SODIUM 100 MG PO CAPS
300.0000 mg | ORAL_CAPSULE | Freq: Every day | ORAL | Status: DC
  Administered 2017-08-24 – 2017-08-25 (×2): 300 mg via ORAL
  Filled 2017-08-24 (×2): qty 3

## 2017-08-24 MED ORDER — LATANOPROST 0.005 % OP SOLN
1.0000 [drp] | Freq: Every day | OPHTHALMIC | Status: DC
  Administered 2017-08-24 – 2017-08-25 (×2): 1 [drp] via OPHTHALMIC
  Filled 2017-08-24: qty 2.5

## 2017-08-24 MED ORDER — FLUTICASONE PROPIONATE 50 MCG/ACT NA SUSP
2.0000 | Freq: Every day | NASAL | Status: DC
  Administered 2017-08-25 – 2017-08-26 (×2): 2 via NASAL
  Filled 2017-08-24: qty 16

## 2017-08-24 NOTE — Progress Notes (Signed)
BP 136/64 (BP Location: Left Arm, Patient Position: Sitting, Cuff Size: Normal)   Pulse 62   Temp 97.8 F (36.6 C) (Oral)   Ht 5\' 7"  (1.702 m)   Wt 187 lb 8 oz (85 kg)   SpO2 98%   BMI 29.37 kg/m    CC: f/u ER visit Subjective:    Patient ID: Charles Rodgers, male    DOB: [birth year], 84 y.o.   MRN: [id number]  HPI: Charles Rodgers is a 84 y.o. male presenting on 08/24/2017 for Hospitalization Follow-up (Seen at [facility] ED on 08/17/17, dx cellulitis of right anterior lower leg. States swelling, redness and pain has spread above the knee. Pt is accompanied by niece, [name] and sister-in-law, Narda Rutherford. )   Recent [facility] ER visit 08/17/2017 for RLE cellulitis. Note reviewed. Treated with renally dosed keflex 500mg  bid and doxycycline 100mg  bid. No leukocytosis, but he did have ongoing anemia and thrombocytopenia. Worsening pain, swelling. Spreading redness now affecting foot. No improvement despite dual abx coverage.  Continues living at home alone. Family checks in on him - has noticed progressive decline in functional status at home. They are worried he is unable to manage cellulitis at home.   Known h/o melanoma and MGUS, ?MDS followed by onc.   Relevant past medical, surgical, family and social history reviewed and updated as indicated. Interim medical history since our last visit reviewed. Allergies and medications reviewed and updated. Outpatient Medications Prior to Visit  Medication Sig Dispense Refill  . amLODipine (NORVASC) 10 MG tablet Take 1 tablet (10 mg total) by mouth daily. 90 tablet 3  . apixaban (ELIQUIS) 2.5 MG TABS tablet Take 1 tablet (2.5 mg total) by mouth 2 (two) times daily. 60 tablet 6  . Azelastine HCl 137 MCG/SPRAY SOLN Place 1-2 sprays into both nostrils as directed.    Sarajane Marek Sodium 30-100 MG CAPS Take 1 capsule by mouth as directed.     . cephALEXin (KEFLEX) 500 MG capsule Take 1 capsule (500 mg total) by mouth 2 (two) times daily for  10 days. 20 capsule 0  . Cholecalciferol (VITAMIN D) 2000 units CAPS Take 1 capsule (2,000 Units total) by mouth daily. 30 capsule   . dorzolamide-timolol (COSOPT) 22.3-6.8 MG/ML ophthalmic solution Place 1 drop into both eyes 2 (two) times daily. 10 mL   . doxazosin (CARDURA) 8 MG tablet TAKE 1 TABLET BY MOUTH AT BEDTIME 90 tablet 1  . doxycycline (VIBRAMYCIN) 100 MG capsule Take 1 capsule (100 mg) by mouth twice daily for 10 days. 20 capsule 0  . ferrous sulfate 325 (65 FE) MG tablet TAKE 1 TABLET BY MOUTH ONCE A DAY WITH BREAKFAST 90 tablet 0  . fluticasone (FLONASE) 50 MCG/ACT nasal spray Place 2 sprays into both nostrils daily. 16 g 3  . furosemide (LASIX) 40 MG tablet Take 1 tablet (40 mg total) by mouth 2 (two) times daily. 180 tablet 3  . glucose blood (ONE TOUCH ULTRA TEST) test strip USE TO CHECK BLOOD SUGAR 3 TIMES DAILY Dx: E11.42 300 each 2  . latanoprost (XALATAN) 0.005 % ophthalmic solution Place 1 drop into both eyes at bedtime. 2.5 mL   . lisinopril (PRINIVIL,ZESTRIL) 20 MG tablet Take 1 tablet (20 mg total) by mouth 2 (two) times daily. 180 tablet 1  . loratadine (CLARITIN) 10 MG tablet Take 1 tablet (10 mg total) by mouth daily. 30 tablet 3  . Multiple Vitamins-Minerals (PRESERVISION AREDS) CAPS Take 2 capsules by mouth daily.    Marland Kitchen  pravastatin (PRAVACHOL) 40 MG tablet TAKE 1 TABLET BY MOUTH AT BEDTIME 90 tablet 1  . spironolactone (ALDACTONE) 25 MG tablet Take 0.5 tablets (12.5 mg total) by mouth every other day. 15 tablet 3  . triamcinolone cream (KENALOG) 0.1 % Apply 1 application topically 2 (two) times daily. 454 g 0   No facility-administered medications prior to visit.      Per HPI unless specifically indicated in ROS section below Review of Systems     Objective:    BP 136/64 (BP Location: Left Arm, Patient Position: Sitting, Cuff Size: Normal)   Pulse 62   Temp 97.8 F (36.6 C) (Oral)   Ht 5\' 7"  (1.702 m)   Wt 187 lb 8 oz (85 kg)   SpO2 98%   BMI 29.37  kg/m   Wt Readings from Last 3 Encounters:  08/24/17 187 lb 8 oz (85 kg)  08/17/17 180 lb (81.6 kg)  07/23/17 183 lb 6 oz (83.2 kg)    Physical Exam  Constitutional: He appears well-developed and well-nourished. No distress.  Musculoskeletal: He exhibits edema (3+ R>L pitting).  No palpable cords or popliteal fullness/tenderness  Skin: There is erythema (spreading erythema up and down R leg now affecting foot).  Nursing note and vitals reviewed.  Results for orders placed or performed during the hospital encounter of 74/08/14  Basic metabolic panel  Result Value Ref Range   Sodium 138 135 - 145 mmol/L   Potassium 4.2 3.5 - 5.1 mmol/L   Chloride 106 101 - 111 mmol/L   CO2 25 22 - 32 mmol/L   Glucose, Bld 173 (H) 65 - 99 mg/dL   BUN 92 (H) 6 - 20 mg/dL   Creatinine, Ser 1.85 (H) 0.61 - 1.24 mg/dL   Calcium 8.8 (L) 8.9 - 10.3 mg/dL   GFR calc non Af Amer 30 (L) >60 mL/min   GFR calc Af Amer 35 (L) >60 mL/min   Anion gap 7 5 - 15  CBC  Result Value Ref Range   WBC 6.8 3.8 - 10.6 K/uL   RBC 2.79 (L) 4.40 - 5.90 MIL/uL   Hemoglobin 8.1 (L) 13.0 - 18.0 g/dL   HCT 25.1 (L) 40.0 - 52.0 %   MCV 89.7 80.0 - 100.0 fL   MCH 29.1 26.0 - 34.0 pg   MCHC 32.4 32.0 - 36.0 g/dL   RDW 15.2 (H) 11.5 - 14.5 %   Platelets 85 (L) 150 - 440 K/uL   Lab Results  Component Value Date   HGBA1C 6.3 03/25/2017       Assessment & Plan:  We called [facility] ER to notify pt on his way.  Problem List Items Addressed This Visit    Cellulitis of right anterior lower leg - Primary    Progressive deterioration despite keflex/doxy 10d course started last week. Failed outpatient management. Recommended ER evaluation and likely admission. He was very resistant to hospitalization but finally agrees to this. Social concerns complicate picture.       CKD stage 4 due to type 2 diabetes mellitus ([place])    Will need close monitoring.       Dilated cardiomyopathy ([place])    Has previously declined further  evaluation. Diuresis not effective with lasix 40mg  bid however he endorses good urinary output "going every hour".       Moderate mitral valve regurgitation   Pedal edema    Deteriorated with 7lb weight gain noted since prior ER evaluation. Endorses good urinary output with lasix 40mg   bid and spironolactone QOD however not effectively managing pedal edema. Will also likely need IV diuresis in hospital to optimize cellulitis healing.       Recurrent falls    Lives alone at home. Widower. Family checks in on him but struggles to care for him. Continues using cane, remains unsteady.  Will benefit from [facility] upon discharge. Has previously declined this.           No orders of the defined types were placed in this encounter.  No orders of the defined types were placed in this encounter.   Follow up plan: Return if symptoms worsen or fail to improve.  [name], MD

## 2017-08-24 NOTE — Progress Notes (Signed)
Pt admitted to unit 1817. Pt alert, unable to determine orientation as patient is so hard of hearing he is not able to answer questions. Pt also had diminished vision. Family at bedside states patient has fluid on his ears and was recently seen by md for tube placement but this was not able to be completed due to pts current condition. Family asked about the living will . Family given doucuments and will get with staff tomorrow with questions. Pt able to tolerate diet. [place]us from ED completed.

## 2017-08-24 NOTE — Assessment & Plan Note (Signed)
Will need close monitoring.

## 2017-08-24 NOTE — Assessment & Plan Note (Addendum)
Lives alone at home. Widower. Family checks in on him but struggles to care for him. Continues using cane, remains unsteady.  Will benefit from [facility] upon discharge. Has previously declined this.

## 2017-08-24 NOTE — ED Triage Notes (Signed)
Pt arrived via POV after office visit with Dr. Danise Mina.  PCP instructed pt to come to the ED for admission to the hospital due to worsening cellulitis in right leg.  Pt is currently on atbx.  Pt has bilateral pitting edema.  Pt is HOH. Niece states she has been giving him tylenol and states pt has been complaining of leg pain for the past few days.

## 2017-08-24 NOTE — Consult Note (Signed)
ANTICOAGULATION CONSULT NOTE - Initial Consult  Pharmacy Consult for Heparin Drip  Indication: DVT  No Known Allergies  Patient Measurements: Height: 5\' 7"  (170.2 cm) Weight: 187 lb (84.8 kg) IBW/kg (Calculated) : 66.1  Vital Signs: Temp: 97.5 F (36.4 C) (05/28 1813) Temp Source: Oral (05/28 1813) BP: 146/63 (05/28 1813) Pulse Rate: 70 (05/28 1813)  Labs: Recent Labs    08/24/17 1203  HGB 7.5*  HCT 23.5*  PLT 128*  CREATININE 2.08*    Estimated Creatinine Clearance: 24.6 mL/min (A) (by C-G formula based on SCr of 2.08 mg/dL (H)).   Medical History: Past Medical History:  Diagnosis Date  . Abscess of axilla, left 02/07/2015  . Arthritis   . BPH (benign prostatic hypertrophy)    with elevated PSA, s/p benign biopsy, followed yearly by [facility] (last seen 01/16/2013)  . CKD (chronic kidney disease) stage 3, GFR 30-59 ml/min ([place])    [place] -> 2017 requested PCP start following  . COPD (chronic obstructive pulmonary disease) ([place]) 2016   by xray  . Diabetes mellitus type 2, controlled ([place])   . Glaucoma   . Hearing loss   . HLD (hyperlipidemia)   . HTN (hypertension)   . IgM monoclonal gammopathy of uncertain significance 08/27/2014   07/2014 suggest rpt 6 mo.  . Malignant melanoma of left upper arm ([place]) 11/14/2014   s/p excision complicated by infected axillary seroma (Dr Carmin Muskrat)  . Melanoma ([place]) 2014   nose and left hand  . Secondary hyperparathyroidism ([place])   . Subclavian artery stenosis, right ([place]) 2014   found on carotid US, seen [facility], recommended recheck 1 yr at vascular lab with Dr. Delana Meyer  . Thyroid nodule 02/2013   right upper lobe - s/p biopsy - benign follicular nodule  . Urine incontinence     Assessment: Pharmacy consulted for heparin dosing and  Monitoring in 84yo male with right leg DVT. Patient was taking apixaban 2.5mg  BID prior to admission.  Last dose reported at 1000 this morning (5/28).   Goal of Therapy:  Heparin level 0.3-0.7  units/ml aPTT 66-102 seconds Monitor platelets by anticoagulation protocol: Yes   Plan:  Baseline labs ordered Give 4500 units bolus x 1 Start heparin infusion at 1350 units/hr Will need to adjust heparin level based on aPTTs until aPTT and HLs correlate.  Check anti-Xa and aPTT in 8 hours and daily while on heparin Continue to monitor H&H and platelets  Pernell Dupre, PharmD, BCPS Clinical Pharmacist 08/24/2017 7:29 PM

## 2017-08-24 NOTE — Patient Instructions (Signed)
I recommend going to hospital for skin infection as you're not getting better on current medicines.

## 2017-08-24 NOTE — ED Provider Notes (Addendum)
[facility] Emergency Department Provider Note  ____________________________________________  Time seen: Approximately 3:19 PM  I have reviewed the triage vital signs and the nursing notes.   HISTORY  Chief Complaint Cellulitis and Leg Swelling    HPI Charles Rodgers is a 84 y.o. male history of CKD, COPD, DM, presenting with right lower extremity swelling and edema, warmth.  The patient has been having a progressively worsening right lower extremity cellulitis, despite being on oral keflex and doxycycline for the past 7 days with continued progression of swelling, erythema, discomfort and warmth which has extended from the mid tibial shaft to now just below the knee.  The patient has been taking Tylenol for pain with improvement.  He has not had any systemic symptoms including fever, chills, change in appetite, nausea or vomiting.  He has no personal history of blood clots, there has not been an ultrasound of the right lower extremity. No hx of tick bites although the pt does spend a lot of time outdoors.  Past Medical History:  Diagnosis Date  . Abscess of axilla, left 02/07/2015  . Arthritis   . BPH (benign prostatic hypertrophy)    with elevated PSA, s/p benign biopsy, followed yearly by [facility] (last seen 01/16/2013)  . CKD (chronic kidney disease) stage 3, GFR 30-59 ml/min ([name])    [place] -> 2017 requested PCP start following  . COPD (chronic obstructive pulmonary disease) ([place]) 2016   by xray  . Diabetes mellitus type 2, controlled ([place])   . Glaucoma   . Hearing loss   . HLD (hyperlipidemia)   . HTN (hypertension)   . IgM monoclonal gammopathy of uncertain significance 08/27/2014   07/2014 suggest rpt 6 mo.  . Malignant melanoma of left upper arm ([place]) 11/14/2014   s/p excision complicated by infected axillary seroma (Dr Carmin Muskrat)  . Melanoma ([place]) 2014   nose and left hand  . Secondary hyperparathyroidism ([place])   . Subclavian artery  stenosis, right ([place]) 2014   found on carotid US, seen [facility], recommended recheck 1 yr at vascular lab with Dr. Delana Meyer  . Thyroid nodule 02/2013   right upper lobe - s/p biopsy - benign follicular nodule  . Urine incontinence     Patient Active Problem List   Diagnosis Date Noted  . Cellulitis of right anterior lower leg 08/24/2017  . Goals of care, counseling/discussion 07/23/2017  . Cerumen impaction 07/20/2017  . Nasal sinus congestion 07/20/2017  . Dilated cardiomyopathy ([place]) 07/20/2017  . Grieving 04/22/2017  . Encounter for therapeutic drug monitoring 11/24/2016  . Vitamin D deficiency 09/23/2016  . Atrial fibrillation with slow ventricular response ([place]) 10/24/2015  . Pedal edema 03/04/2015  . Recurrent falls 03/04/2015  . History of malignant melanoma of skin 11/19/2014  . Melanoma of upper arm ([place]) 11/14/2014  . Monoclonal gammopathy of unknown significance ([facility]) 08/27/2014  . Advanced care planning/counseling discussion 02/09/2014  . Health maintenance examination 02/09/2014  . Elevated prostate specific antigen (PSA) 01/13/2013  . Right thyroid nodule   . Subclavian artery stenosis, right ([place])   . Medicare annual wellness visit, subsequent 05/18/2012  . Thrombocytopenia ([place]) 05/18/2012  . Anemia in chronic kidney disease 05/18/2012  . Glaucoma   . HOH (hard of hearing)   . Enlarged prostate with lower urinary tract symptoms (LUTS)   . Moderate mitral valve regurgitation 09/10/2011  . Controlled type 2 diabetes mellitus with diabetic nephropathy, without long-term current use of insulin ([place])   . HTN (hypertension)   .  Hyperlipidemia   . CKD stage 4 due to type 2 diabetes mellitus [facility])     Past Surgical History:  Procedure Laterality Date  . APPENDECTOMY  1938  . arm surgery     Right after trauma  . BIOPSY THYROID Right 16/9450   benign follicular nodule  . carotid US  07/2007   WNL  . MOHS SURGERY Left 07/2012   L dorsal hand for basosquamous  carcinoma [facility])  . US ECHOCARDIOGRAPHY  06/2007   EF 55%, mod dil LA, mild LVH,mild pulm HTN    Current Outpatient Rx  . Order #: [id number] Class: Normal  . Order #: [id number] Class: Normal  . Order #: [id number] Class: Historical Med  . Order #: [id number] Class: Historical Med  . Order #: [id number] Class: Print  . Order #: [id number] Class: OTC  . Order #: [id number] Class: No Print  . Order #: [id number] Class: Normal  . Order #: [id number] Class: Print  . Order #: [id number] Class: Normal  . Order #: [id number] Class: Normal  . Order #: [id number] Class: Normal  . Order #: [id number] Class: Normal  . Order #: [id number] Class: No Print  . Order #: [id number] Class: Normal  . Order #: [id number] Class: Normal  . Order #: [id number] Class: Historical Med  . Order #: [id number] Class: Normal  . Order #: [id number] Class: Normal  . Order #: [id number] Class: Normal    Allergies Patient has no known allergies.  Family History  Problem Relation Age of Onset  . Cancer Mother        stomach  . Cancer Brother        colon  . Cancer Brother        colon  . Cancer Sister        hodgkin's lymphoma  . Cancer Brother        prostate  . Diabetes Brother   . Coronary artery disease Neg Hx   . Stroke Neg Hx     Social History Social History   Tobacco Use  . Smoking status: Former Smoker    Packs/day: 0.50    Years: 15.00    Pack years: 7.50    Types: Cigarettes    Last attempt to quit: 03/31/1967    Years since quitting: 50.4  . Smokeless tobacco: Never Used  Substance Use Topics  . Alcohol use: No  . Drug use: No    Review of Systems Constitutional: No fever/chills.  No lightheadedness or swelling.  No general malaise. Eyes: No visual changes. ENT: No congestion or rhinorrhea.  Positive baseline significant hearing loss. Cardiovascular: Denies chest pain.  Respiratory: Denies shortness of breath.  No cough. Gastrointestinal: No abdominal pain.  No nausea, no vomiting.  No diarrhea.  No  constipation. Genitourinary: Negative for dysuria. Musculoskeletal: Negative for back pain. Skin: Positive for right lower extremity swelling, erythema, and warmth that is progressing despite oral antibiotics. Neurological: Negative for headaches. No focal numbness, tingling or weakness.     ____________________________________________   PHYSICAL EXAM:  VITAL SIGNS: ED Triage Vitals [08/24/17 1155]  Enc Vitals Group     BP (!) 142/46     Pulse Rate 63     Resp 16     Temp 97.6 F (36.4 C)     Temp Source Oral     SpO2 99 %     Weight 187 lb (84.8 kg)     Height 5\' 7"  (1.702 m)     Head Circumference      Peak Flow      Pain Score  Pain Loc      Pain Edu?      Excl. in [place]?     Constitutional: Alert and oriented.  Chronically ill appearing, significantly hard of hearing, but in no acute distress. Answers questions appropriately. Eyes: Conjunctivae are normal.  EOMI. No scleral icterus. Head: Atraumatic. Nose: No congestion/rhinnorhea. Mouth/Throat: Mucous membranes are moist.  Neck: No stridor.  Supple.  No JVD.  No meningismus. Cardiovascular: Normal rate, regular rhythm. No murmurs, rubs or gallops.  Respiratory: Normal respiratory effort.  No accessory muscle use or retractions. Lungs CTAB.  No wheezes, rales or ronchi. Gastrointestinal: Obese.  Soft, nontender and nondistended.  No guarding or rebound.  No peritoneal signs. Musculoskeletal: The patient has bilateral pitting edema to the knees, slightly worse on the right.  In addition, on the right side, the patient has erythema from the midfoot to just below the patella with associated overlying warmth.  He has 2 areas of skin break without any purulent discharge.  He has no palpable cords or tenderness to palpation particularly in the calves.  He has palpable DP pulses bilaterally; PT pulse assessment is limited due to significant swelling. Neurologic:  A&Ox3.  Speech is clear.  Face and smile are symmetric.  EOMI.   Moves all extremities well. Skin: See above for skin exam. Psychiatric: Mood and affect are normal. Speech and behavior are normal.  Normal judgement.  ____________________________________________   LABS (all labs ordered are listed, but only abnormal results are displayed)  Labs Reviewed  COMPREHENSIVE METABOLIC PANEL - Abnormal; Notable for the following components:      Result Value   CO2 20 (*)    Glucose, Bld 152 (*)    BUN 97 (*)    Creatinine, Ser 2.08 (*)    Calcium 8.6 (*)    Albumin 3.0 (*)    ALT 15 (*)    GFR calc non Af Amer 26 (*)    GFR calc Af Amer 31 (*)    All other components within normal limits  CBC WITH DIFFERENTIAL/PLATELET - Abnormal; Notable for the following components:   RBC 2.62 (*)    Hemoglobin 7.5 (*)    HCT 23.5 (*)    RDW 14.9 (*)    Platelets 128 (*)    Lymphs Abs 0.4 (*)    All other components within normal limits  LACTIC ACID, PLASMA  LACTIC ACID, PLASMA   ____________________________________________  EKG  Not indicated ____________________________________________  RADIOLOGY  No results found.  ____________________________________________   PROCEDURES  Procedure(s) performed: None  Procedures  Critical Care performed: No ____________________________________________   INITIAL IMPRESSION / ASSESSMENT AND PLAN / ED COURSE  Pertinent labs & imaging results that were available during my care of the patient were reviewed by me and considered in my medical decision making (see chart for details).  84 y.o. male with a history of CKD presenting for progressive right-sided lower extremity edema, erythema, and warmth with some mild pain that is well treated with Tylenol.  Overall, the patient's clinical picture is most consistent with a right lower extremity cellulitis that is failing outpatient antibiotics.  At this time, he has no signs of symptoms of more overwhelming infection including sepsis, bacteremia, as he is not having  any systemic symptoms.  A right lower extremely ultrasound has been ordered to evaluate for DVT although this is less likely.  We will proceed with IV antibiotics and admission to the hospital for continued evaluation and treatment.  Laboratory studies do show a  mild acute on chronic renal insufficiency which will be treated with IV fluids.  The patient's pain has been well controlled with Tylenol and we will continue this as needed.  In admission at this time.  The patient and his niece and brother are in agreement with the plan.  ____________________________________________  FINAL CLINICAL IMPRESSION(S) / ED DIAGNOSES  Final diagnoses:  Cellulitis and abscess of right lower extremity  Acute on chronic renal insufficiency         NEW MEDICATIONS STARTED DURING THIS VISIT:  New Prescriptions   No medications on file      Eula Listen, MD 08/24/17 1543    Eula Listen, MD 08/24/17 1546

## 2017-08-24 NOTE — H&P (Signed)
[place] [facility] [place] NAME: Charles Rodgers    MR#:  [id number]  DATE OF BIRTH:  [birth year]  DATE OF ADMISSION:  08/24/2017  PRIMARY CARE PHYSICIAN: [name], MD   REQUESTING/REFERRING PHYSICIAN:  CHIEF COMPLAINT: Cellulitis, not getting better despite antibiotics.   Chief Complaint  Patient presents with  . Cellulitis  . Leg Swelling    HISTORY OF PRESENT ILLNESS:  Charles Rodgers  is a 84 y.o. male with a known history of BPH, COPD, diabetes mellitus type 2, hearing loss, hyperlipidemia, hypertension, GUS brought in by family because of worsening right leg cellulitis.  Patient went to PCP in a week ago and was given antibiotics, patient also was seen in the emergency room 2 days ago, patient continued to take Keflex, doxycycline  despite that continued to have worsening redness, swelling, pain of the right leg especially below the right knee all the way extending to the right ankle.  Patient had right leg ultrasound which showed DVT in the right peroneal vein.  Patient lives alone, sister-in-law helps him with food, niece also checks on him but he is independent, walks with walker.  But very hard of hearing, as per niece patient went to see ENT and was told that he has fluid in his left ear and needs tube. PAST MEDICAL HISTORY:   Past Medical History:  Diagnosis Date  . Abscess of axilla, left 02/07/2015  . Arthritis   . BPH (benign prostatic hypertrophy)    with elevated PSA, s/p benign biopsy, followed yearly by [facility] (last seen 01/16/2013)  . CKD (chronic kidney disease) stage 3, GFR 30-59 ml/min ([place])    [place] -> 2017 requested PCP start following  . COPD (chronic obstructive pulmonary disease) ([place]) 2016   by xray  . Diabetes mellitus type 2, controlled ([place])   . Glaucoma   . Hearing loss   . HLD (hyperlipidemia)   . HTN (hypertension)   . IgM monoclonal gammopathy of uncertain significance 08/27/2014   07/2014  suggest rpt 6 mo.  . Malignant melanoma of left upper arm ([place]) 11/14/2014   s/p excision complicated by infected axillary seroma (Dr Carmin Muskrat)  . Melanoma ([place]) 2014   nose and left hand  . Secondary hyperparathyroidism ([place])   . Subclavian artery stenosis, right ([place]) 2014   found on carotid US, seen [facility], recommended recheck 1 yr at vascular lab with Dr. Delana Meyer  . Thyroid nodule 02/2013   right upper lobe - s/p biopsy - benign follicular nodule  . Urine incontinence     PAST SURGICAL HISTOIRY:   Past Surgical History:  Procedure Laterality Date  . APPENDECTOMY  1938  . arm surgery     Right after trauma  . BIOPSY THYROID Right 03/6008   benign follicular nodule  . carotid US  07/2007   WNL  . 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 US ECHOCARDIOGRAPHY  06/2007   EF 55%, mod dil LA, mild LVH,mild pulm HTN    SOCIAL HISTORY:   Social History   Tobacco Use  . Smoking status: Former Smoker    Packs/day: 0.50    Years: 15.00    Pack years: 7.50    Types: Cigarettes    Last attempt to quit: 03/31/1967    Years since quitting: 50.4  . Smokeless tobacco: Never Used  Substance Use Topics  . Alcohol use: No    FAMILY HISTORY:  Family History  Problem Relation Age of Onset  . Cancer Mother        stomach  . Cancer Brother        colon  . Cancer Brother        colon  . Cancer Sister        hodgkin's lymphoma  . Cancer Brother        prostate  . Diabetes Brother   . Coronary artery disease Neg Hx   . Stroke Neg Hx     DRUG ALLERGIES:  No Known Allergies  REVIEW OF SYSTEMS:  very hard of hearing, unable to obtain review of systems.  He appears comfortable.  MEDICATIONS AT HOME:   Prior to Admission medications   Medication Sig Start Date End Date Taking? Authorizing Provider  amLODipine (NORVASC) 10 MG tablet Take 1 tablet (10 mg total) by mouth daily. 06/14/17  Yes [name], MD  apixaban (ELIQUIS) 2.5  MG TABS tablet Take 1 tablet (2.5 mg total) by mouth 2 (two) times daily. 04/22/17  Yes [name], MD  Azelastine HCl 137 MCG/SPRAY SOLN Place 1-2 sprays into both nostrils at bedtime.  08/11/17  Yes [provider]  cephALEXin (KEFLEX) 500 MG capsule Take 1 capsule (500 mg total) by mouth 2 (two) times daily for 10 days. 08/17/17 08/27/17 Yes Hinda Kehr, MD  docusate sodium (COLACE) 100 MG capsule Take 300 mg by mouth at bedtime.   Yes [provider]  dorzolamide-timolol (COSOPT) 22.3-6.8 MG/ML ophthalmic solution Place 1 drop into both eyes 2 (two) times daily. 02/24/16  Yes [name], MD  doxazosin (CARDURA) 8 MG tablet TAKE 1 TABLET BY MOUTH AT BEDTIME 05/14/17  Yes [name], MD  doxycycline (VIBRAMYCIN) 100 MG capsule Take 1 capsule (100 mg) by mouth twice daily for 10 days. 08/17/17  Yes Hinda Kehr, MD  ferrous sulfate 325 (65 FE) MG tablet TAKE 1 TABLET BY MOUTH ONCE A DAY WITH BREAKFAST Patient taking differently: TAKE 2 TABLETS BY MOUTH ONCE A DAY WITH BREAKFAST 01/22/17  Yes Corcoran, Melissa C, MD  fluticasone (FLONASE) 50 MCG/ACT nasal spray Place 2 sprays into both nostrils daily. 07/20/17  Yes [name], MD  furosemide (LASIX) 40 MG tablet Take 1 tablet (40 mg total) by mouth 2 (two) times daily. 06/14/17  Yes [name], MD  glucose blood (ONE TOUCH ULTRA TEST) test strip USE TO CHECK BLOOD SUGAR 3 TIMES DAILY Dx: E11.42 11/21/15  Yes [name], MD  latanoprost (XALATAN) 0.005 % ophthalmic solution Place 1 drop into both eyes at bedtime. 02/24/16  Yes [name], MD  lisinopril (PRINIVIL,ZESTRIL) 20 MG tablet Take 1 tablet (20 mg total) by mouth 2 (two) times daily. 05/24/17  Yes [name], MD  pravastatin (PRAVACHOL) 40 MG tablet TAKE 1 TABLET BY MOUTH AT BEDTIME 05/14/17  Yes [name], MD  psyllium (METAMUCIL) 58.6 % packet Take 1 packet by mouth at bedtime.   Yes [provider]   spironolactone (ALDACTONE) 25 MG tablet Take 0.5 tablets (12.5 mg total) by mouth every other day. 06/14/17  Yes [name], MD  triamcinolone cream (KENALOG) 0.1 % Apply 1 application topically 2 (two) times daily. 08/04/17  Yes Copland, Frederico Hamman, MD  Cholecalciferol (VITAMIN D) 2000 units CAPS Take 1 capsule (2,000 Units total) by mouth daily. Patient not taking: Reported on 08/24/2017 06/14/17   [name], MD  loratadine (CLARITIN) 10 MG tablet Take 1 tablet (10 mg total) by mouth daily. Patient not taking: Reported on  08/24/2017 07/20/17   [name], MD      VITAL SIGNS:  Blood pressure 132/70, pulse 84, temperature 97.6 F (36.4 C), temperature source Oral, resp. rate 16, height 5\' 7"  (1.702 m), weight 84.8 kg (187 lb), SpO2 95 %.  PHYSICAL EXAMINATION:  GENERAL:  84 y.o.-year-old patient lying in the bed with no acute distress.   eYES: Pupils equal, round, reactive to light and accommodation. No scleral icterus. Extraocular muscles intact.  HEENT: Head atraumatic, normocephalic. Oropharynx and nasopharynx clear.  NECK:  Supple, no jugular venous distention. No thyroid enlargement, no tenderness.  LUNGS: Normal breath sounds bilaterally, no wheezing, rales,rhonchi or crepitation. No use of accessory muscles of respiration.  CARDIOVASCULAR: S1, S2 normal. No murmurs, rubs, or gallops.  ABDOMEN: Soft, nontender, nondistended. Bowel sounds present. No organomegaly or mass.  EXTREMITIES: Patient has erythema, edema of the right leg, slight tenderness.  Left leg also edematous slightly but no erythema. NEUROLOGIC: Cranial nerves II through XII are intact. Muscle strength 5/5 in all extremities. Sensation intact. Gait not checked.  PSYCHIATRIC: The patient is alert and oriented x 3.  Hard of hearing. SKIN: No obvious rash, lesion, or ulcer.   LABORATORY PANEL:   CBC Recent Labs  Lab 08/24/17 1203  WBC 5.2  HGB 7.5*  HCT 23.5*  PLT 128*    ------------------------------------------------------------------------------------------------------------------  Chemistries  Recent Labs  Lab 08/24/17 1203  NA 137  K 4.2  CL 106  CO2 20*  GLUCOSE 152*  BUN 97*  CREATININE 2.08*  CALCIUM 8.6*  AST 19  ALT 15*  ALKPHOS 60  BILITOT 0.5   ------------------------------------------------------------------------------------------------------------------  Cardiac Enzymes No results for input(s): TROPONINI in the last 168 hours. ------------------------------------------------------------------------------------------------------------------  RADIOLOGY:  US Venous Img Lower Unilateral Right  Result Date: 08/24/2017 CLINICAL DATA:  Right lower extremity edema and pain. EXAM: RIGHT LOWER EXTREMITY VENOUS DOPPLER ULTRASOUND TECHNIQUE: Gray-scale sonography with graded compression, as well as color Doppler and duplex ultrasound were performed to evaluate the lower extremity deep venous systems from the level of the common femoral vein and including the common femoral, femoral, profunda femoral, popliteal and calf veins including the posterior tibial, peroneal and gastrocnemius veins when visible. The superficial great saphenous vein was also interrogated. Spectral Doppler was utilized to evaluate flow at rest and with distal augmentation maneuvers in the common femoral, femoral and popliteal veins. COMPARISON:  None. FINDINGS: Contralateral Common Femoral Vein: Respiratory phasicity is normal and symmetric with the symptomatic side. No evidence of thrombus. Normal compressibility. Common Femoral Vein: No evidence of thrombus. Normal compressibility, respiratory phasicity and response to augmentation. Saphenofemoral Junction: No evidence of thrombus. Normal compressibility and flow on color Doppler imaging. Profunda Femoral Vein: No evidence of thrombus. Normal compressibility and flow on color Doppler imaging. Femoral Vein: No evidence of  thrombus. Normal compressibility, respiratory phasicity and response to augmentation. Popliteal Vein: No evidence of thrombus. Normal compressibility, respiratory phasicity and response to augmentation. Calf Veins: There appears to be some thrombus in the right peroneal vein of the calf. Other visualized calf veins are normally patent. Superficial Great Saphenous Vein: No evidence of thrombus. Normal compressibility. Venous Reflux:  None. Other Findings: No evidence of superficial thrombophlebitis or abnormal fluid collection. IMPRESSION: Right lower extremity DVT isolated to the peroneal vein of the calf. Electronically Signed   By: Aletta Edouard M.D.   On: 08/24/2017 16:39    EKG:   Orders placed or performed in visit on 09/23/16  . EKG 12-Lead    IMPRESSION AND  PLAN:   84 year old male patient with history of CKD stage III, essential hypertension, chronic atrial fibrillation brought by family because of worsening right leg cellulitis.  #1. Right leg cellulitis likely secondary to DVT rather than cellulitis at this point.  Patient already is taking Eliquis for atrial fibrillation and he developed a clot.  Consult oncology, vascular surgery.d/c eliquis and start heparin drip? #2. isolated peroneal vein DVT'' vascular surgery consult for possible IVC filter placement. Anemia of chronic kidney disease, patient followed up with Dr. Mike Gip for  epo injections.  Consult oncology. 3.htn;controlled chronicdiastolic heart failure EF 65% by echo April 2019 4.CKD stage 3' stable;  by PCP.  All the records are reviewed and case discussed with ED provider. Management plans discussed with the patient, family and they are in agreement.  CODE STATUS: full D/w niece ,sister in law,about code status.they just started to help him,advised to think about that.will request hospital chaplain visit.  TOTAL TIME TAKING CARE OF THIS PATIENT: 55 minutes.    Epifanio Lesches M.D on 08/24/2017 at 5:08  PM  Between 7am to 6pm - Pager - [phone]  After 6pm go to [email or web address] - password [email or web address] [facility]  Office  [phone]  CC: Primary care physician; [name], MD  Note: This dictation was prepared with Dragon dictation along with smaller phrase technology. Any transcriptional errors that result from this process are unintentional.

## 2017-08-24 NOTE — ED Notes (Signed)
Blue and red top tube sent to lab

## 2017-08-24 NOTE — Assessment & Plan Note (Signed)
Progressive deterioration despite keflex/doxy 10d course started last week. Failed outpatient management. Recommended ER evaluation and likely admission. He was very resistant to hospitalization but finally agrees to this. Social concerns complicate picture.

## 2017-08-24 NOTE — Assessment & Plan Note (Signed)
Has previously declined further evaluation. Diuresis not effective with lasix 40mg  bid however he endorses good urinary output "going every hour".

## 2017-08-24 NOTE — Progress Notes (Signed)
Family Meeting Note  Advance Directive:yes  Today a meeting took place with the sister n Law and Niece.  Patient is unable to participate because he is very hard of hearing.   The following clinical team members were present during this meeting:MD  Discussed about his medical problems of chronic atrial fibrillation, CKD stage III, now with right leg DVT.  Sister-in-law, niece started taking care of the patient and knowing his medical problems.  Patient has a daughter but according to them he does not visit often.  Recommend talking to chaplain and also discussed with patient about his full code, healthcare living will. Total time spent on the discussion 17 minutes. Epifanio Lesches, MD

## 2017-08-24 NOTE — Assessment & Plan Note (Addendum)
Deteriorated with 7lb weight gain noted since prior ER evaluation. Endorses good urinary output with lasix 40mg  bid and spironolactone QOD however not effectively managing pedal edema. Will also likely need IV diuresis in hospital to optimize cellulitis healing.

## 2017-08-25 ENCOUNTER — Telehealth: Payer: Self-pay | Admitting: Family Medicine

## 2017-08-25 DIAGNOSIS — I82451 Acute embolism and thrombosis of right peroneal vein: Secondary | ICD-10-CM

## 2017-08-25 LAB — GLUCOSE, CAPILLARY: GLUCOSE-CAPILLARY: 92 mg/dL (ref 65–99)

## 2017-08-25 LAB — CBC
HEMATOCRIT: 22.4 % — AB (ref 40.0–52.0)
HEMOGLOBIN: 7.2 g/dL — AB (ref 13.0–18.0)
MCH: 28.6 pg (ref 26.0–34.0)
MCHC: 31.9 g/dL — ABNORMAL LOW (ref 32.0–36.0)
MCV: 89.6 fL (ref 80.0–100.0)
Platelets: 117 10*3/uL — ABNORMAL LOW (ref 150–440)
RBC: 2.5 MIL/uL — ABNORMAL LOW (ref 4.40–5.90)
RDW: 14.6 % — ABNORMAL HIGH (ref 11.5–14.5)
WBC: 5.2 10*3/uL (ref 3.8–10.6)

## 2017-08-25 LAB — BASIC METABOLIC PANEL
ANION GAP: 7 (ref 5–15)
BUN: 88 mg/dL — ABNORMAL HIGH (ref 6–20)
CALCIUM: 8.1 mg/dL — AB (ref 8.9–10.3)
CHLORIDE: 111 mmol/L (ref 101–111)
CO2: 20 mmol/L — AB (ref 22–32)
Creatinine, Ser: 1.92 mg/dL — ABNORMAL HIGH (ref 0.61–1.24)
GFR calc Af Amer: 34 mL/min — ABNORMAL LOW (ref >60)
GFR calc non Af Amer: 29 mL/min — ABNORMAL LOW (ref >60)
GLUCOSE: 97 mg/dL (ref 65–99)
POTASSIUM: 4.3 mmol/L (ref 3.5–5.1)
Sodium: 138 mmol/L (ref 135–145)

## 2017-08-25 LAB — HEPARIN LEVEL (UNFRACTIONATED): Heparin Unfractionated: 1.7 IU/mL — ABNORMAL HIGH (ref 0.30–0.70)

## 2017-08-25 LAB — APTT

## 2017-08-25 MED ORDER — IPRATROPIUM-ALBUTEROL 0.5-2.5 (3) MG/3ML IN SOLN
3.0000 mL | RESPIRATORY_TRACT | Status: DC | PRN
  Administered 2017-08-25: 3 mL via RESPIRATORY_TRACT
  Filled 2017-08-25: qty 3

## 2017-08-25 MED ORDER — HEPARIN (PORCINE) IN NACL 100-0.45 UNIT/ML-% IJ SOLN: 1150.0000 [IU]/h | INTRAMUSCULAR | Status: DC

## 2017-08-25 MED ORDER — CEPHALEXIN 500 MG PO CAPS
500.0000 mg | ORAL_CAPSULE | Freq: Two times a day (BID) | ORAL | Status: DC
  Administered 2017-08-25: 500 mg via ORAL
  Filled 2017-08-25: qty 1

## 2017-08-25 MED ORDER — CEFAZOLIN SODIUM-DEXTROSE 1-4 GM/50ML-% IV SOLN
1.0000 g | Freq: Two times a day (BID) | INTRAVENOUS | Status: DC
  Administered 2017-08-25 – 2017-08-26 (×3): 1 g via INTRAVENOUS
  Filled 2017-08-25 (×6): qty 50

## 2017-08-25 MED ORDER — APIXABAN 2.5 MG PO TABS
2.5000 mg | ORAL_TABLET | Freq: Two times a day (BID) | ORAL | Status: DC
  Administered 2017-08-25 – 2017-08-26 (×3): 2.5 mg via ORAL
  Filled 2017-08-25 (×3): qty 1

## 2017-08-25 MED ORDER — IPRATROPIUM-ALBUTEROL 0.5-2.5 (3) MG/3ML IN SOLN
RESPIRATORY_TRACT | Status: AC
  Administered 2017-08-25: 3 mL
  Filled 2017-08-25: qty 3

## 2017-08-25 NOTE — Telephone Encounter (Signed)
Appointment 6/4 @ 3:30 with dr g debra aware

## 2017-08-25 NOTE — Progress Notes (Signed)
Critical lab called PTT >160. MD notified. New orders placed and followed. Will continue to monitor.

## 2017-08-25 NOTE — Progress Notes (Signed)
I am asked to evaluate the patient with the finding of DVT in the peroneal vein.  The patient is a 84 year old gentleman who is been treated for a cellulitis of the right leg.  He has been treated for a little over 1 week for right lower extremity cellulitis.  At one point the redness and swelling extended above the knee.  This is clearly well above the peroneal vein distribution.  Given the if associated inflammatory changes with an infection that can be very difficult to diagnose DVT at the tibial level.  Also, the finding of tibial DVT does not warrant anticoagulation.  Given these findings I do not believe this is an Eliquis failure.  At this time I would continue to treat what appears to be a indolent cellulitis.  I would rescan the right venous system in 3 to 5 days to check for propagation and if none is identified then I would not further investigate the DVT finding.  Full consult to follow

## 2017-08-25 NOTE — Telephone Encounter (Signed)
Spoke with hospitalist Dr Posey Pronto - requests we schedule outpatient ultrasound to follow R peroneal DVT found at hospitalization. [facility] thinks it's not an eliquis failure so this has been continued.  plz schedule outpatient ultrasound for Monday 6/3 and schedule hospital f/u visit with me Tuesday 6/4 to review.  Patient hard of hearing. May call niece Hilda Blades with scheduling info [phone]

## 2017-08-25 NOTE — Progress Notes (Signed)
[name]    MR#:  [id number]  DATE OF BIRTH:  1924-10-06  SUBJECTIVE:  Came in with increasing redness in the right LE Pain better Hard on hearing Niece and sister in the room REVIEW OF SYSTEMS:   Review of Systems  Constitutional: Negative for chills, fever and weight loss.  HENT: Positive for hearing loss. Negative for ear discharge, ear pain and nosebleeds.   Eyes: Negative for blurred vision, pain and discharge.  Respiratory: Negative for sputum production, shortness of breath, wheezing and stridor.   Cardiovascular: Positive for leg swelling. Negative for chest pain, palpitations, orthopnea and PND.  Gastrointestinal: Negative for abdominal pain, diarrhea, nausea and vomiting.  Genitourinary: Negative for frequency and urgency.  Musculoskeletal: Positive for joint pain. Negative for back pain.  Neurological: Negative for sensory change, speech change, focal weakness and weakness.  Psychiatric/Behavioral: Negative for depression and hallucinations. The patient is not nervous/anxious.    Tolerating Diet:yes Tolerating PT:   DRUG ALLERGIES:  No Known Allergies  VITALS:  Blood pressure (!) 160/65, pulse 70, temperature 97.7 F (36.5 C), temperature source Oral, resp. rate 18, height 5\' 7"  (1.702 m), weight 85 kg (187 lb 4.8 oz), SpO2 91 %.  PHYSICAL EXAMINATION:   Physical Exam  GENERAL:  84 y.o.-year-old patient lying in the bed with no acute distress.  EYES: Pupils equal, round, reactive to light and accommodation. No scleral icterus. Extraocular muscles intact.  HEENT: Head atraumatic, normocephalic. Oropharynx and nasopharynx clear. Impaired hearin NECK:  Supple, no jugular venous distention. No thyroid enlargement, no tenderness.  LUNGS: Normal breath sounds bilaterally, no wheezing, rales, rhonchi. No use of accessory muscles of respiration.  CARDIOVASCULAR: S1, S2 normal. No  murmurs, rubs, or gallops.  ABDOMEN: Soft, nontender, nondistended. Bowel sounds present. No organomegaly or mass.  EXTREMITIES: No cyanosis, clubbing  -++ chronic edema b/l.   Right tibial shin redness/erythema present NEUROLOGIC: Cranial nerves II through XII are intact. No focal Motor or sensory deficits b/l.   PSYCHIATRIC:  patient is alert and oriented x 3.  SKIN: No obvious rash, lesion, or ulcer.   LABORATORY PANEL:  CBC Recent Labs  Lab 08/25/17 0436  WBC 5.2  HGB 7.2*  HCT 22.4*  PLT 117*    Chemistries  Recent Labs  Lab 08/24/17 1203 08/25/17 0436  NA 137 138  K 4.2 4.3  CL 106 111  CO2 20* 20*  GLUCOSE 152* 97  BUN 97* 88*  CREATININE 2.08* 1.92*  CALCIUM 8.6* 8.1*  AST 19  --   ALT 15*  --   ALKPHOS 60  --   BILITOT 0.5  --    Cardiac Enzymes No results for input(s): TROPONINI in the last 168 hours. RADIOLOGY:  US Venous Img Lower Unilateral Right  Result Date: 08/24/2017 CLINICAL DATA:  Right lower extremity edema and pain. EXAM: RIGHT LOWER EXTREMITY VENOUS DOPPLER ULTRASOUND TECHNIQUE: Gray-scale sonography with graded compression, as well as color Doppler and duplex ultrasound were performed to evaluate the lower extremity deep venous systems from the level of the common femoral vein and including the common femoral, femoral, profunda femoral, popliteal and calf veins including the posterior tibial, peroneal and gastrocnemius veins when visible. The superficial great saphenous vein was also interrogated. Spectral Doppler was utilized to evaluate flow at rest and with distal augmentation maneuvers in the common femoral, femoral and popliteal veins. COMPARISON:  None. FINDINGS: Contralateral Common Femoral Vein: Respiratory phasicity is  normal and symmetric with the symptomatic side. No evidence of thrombus. Normal compressibility. Common Femoral Vein: No evidence of thrombus. Normal compressibility, respiratory phasicity and response to augmentation.  Saphenofemoral Junction: No evidence of thrombus. Normal compressibility and flow on color Doppler imaging. Profunda Femoral Vein: No evidence of thrombus. Normal compressibility and flow on color Doppler imaging. Femoral Vein: No evidence of thrombus. Normal compressibility, respiratory phasicity and response to augmentation. Popliteal Vein: No evidence of thrombus. Normal compressibility, respiratory phasicity and response to augmentation. Calf Veins: There appears to be some thrombus in the right peroneal vein of the calf. Other visualized calf veins are normally patent. Superficial Great Saphenous Vein: No evidence of thrombus. Normal compressibility. Venous Reflux:  None. Other Findings: No evidence of superficial thrombophlebitis or abnormal fluid collection. IMPRESSION: Right lower extremity DVT isolated to the peroneal vein of the calf. Electronically Signed   By: Aletta Edouard M.D.   On: 08/24/2017 16:39   ASSESSMENT AND PLAN:   Charles Rodgers  is a 84 y.o. male with a known history of BPH, COPD, diabetes mellitus type 2, hearing loss, hyperlipidemia, hypertension, GUS brought in by family because of worsening right leg cellulitis.   84 year old male patient with history of CKD stage III, essential hypertension, chronic atrial fibrillation brought by family because of worsening right leg cellulitis.  #1. Right leg cellulitis  -IV Cefazolin, WBC normal. Keep leg elevated -pt has had cellulitis in LE before due to dry skin and breakdown  #2. isolated peroneal vein DVT' -vascular surgery consult with Dr Delana Meyer -no need for IVC--does not appear to be eliquis failure. D/c heparin gtt -pt will need [place] LE on Monday and then f/u dr Delana Meyer if any worsening of DVT This was d/w PCP Dr Leo Grosser who will take care of the US doppler LE orders as outpt -d/w niece debra [phone]  3. Anemia of chronic kidney disease, patient followed up with Dr. Mike Gip for  epo injections.    4.htn -controlled  PT to see pt D/w Drs Delana Meyer and Leo Grosser.   Case discussed with Care Management/Social Worker. Management plans discussed with the patient, family and they are in agreement.  CODE STATUS: Full  DVT Prophylaxis: eliquis  TOTAL TIME TAKING CARE OF THIS PATIENT: *35* minutes.  >50% time spent on counselling and coordination of care  POSSIBLE D/C IN *1-2* DAYS, DEPENDING ON CLINICAL CONDITION.  Note: This dictation was prepared with Dragon dictation along with smaller phrase technology. Any transcriptional errors that result from this process are unintentional.  Fritzi Mandes M.D on 08/25/2017 at 9:10 AM  Between 7am to 6pm - Pager - [phone]  After 6pm go to [email or web address] - password [email or web address] [facility]  Office  [phone]  CC: Primary care physician; [name], MDPatient ID: Charles Rodgers, male   DOB: January 20, 1925, 84 y.o.   MRN: [id number]

## 2017-08-25 NOTE — Telephone Encounter (Signed)
[place]  Left me know when you have ultra sound done so I can schedule follow up with dr g

## 2017-08-25 NOTE — Consult Note (Signed)
ANTICOAGULATION CONSULT NOTE - Initial Consult  Pharmacy Consult for Heparin Drip  Indication: DVT  No Known Allergies  Patient Measurements: Height: 5\' 7"  (170.2 cm) Weight: 187 lb 4.8 oz (85 kg) IBW/kg (Calculated) : 66.1  Vital Signs: Temp: 98.5 F (36.9 C) (05/29 0414) Temp Source: Oral (05/29 0414) BP: 124/54 (05/29 0414) Pulse Rate: 62 (05/29 0414)  Labs: Recent Labs    08/24/17 1203 08/24/17 1933 08/25/17 0436  HGB 7.5*  --  7.2*  HCT 23.5*  --  22.4*  PLT 128*  --  117*  APTT  --  53* >160*  LABPROT  --  20.3*  --   INR  --  1.75  --   HEPARINUNFRC  --  1.75* 1.70*  CREATININE 2.08*  --  1.92*    Estimated Creatinine Clearance: 26.7 mL/min (A) (by C-G formula based on SCr of 1.92 mg/dL (H)).   Medical History: Past Medical History:  Diagnosis Date  . Abscess of axilla, left 02/07/2015  . Arthritis   . BPH (benign prostatic hypertrophy)    with elevated PSA, s/p benign biopsy, followed yearly by [facility] (last seen 01/16/2013)  . CKD (chronic kidney disease) stage 3, GFR 30-59 ml/min ([place])    [place] -> 2017 requested PCP start following  . COPD (chronic obstructive pulmonary disease) ([place]) 2016   by xray  . Diabetes mellitus type 2, controlled ([place])   . Glaucoma   . Hearing loss   . HLD (hyperlipidemia)   . HTN (hypertension)   . IgM monoclonal gammopathy of uncertain significance 08/27/2014   07/2014 suggest rpt 6 mo.  . Malignant melanoma of left upper arm ([place]) 11/14/2014   s/p excision complicated by infected axillary seroma (Dr Carmin Muskrat)  . Melanoma ([place]) 2014   nose and left hand  . Secondary hyperparathyroidism ([place])   . Subclavian artery stenosis, right ([place]) 2014   found on carotid US, seen [facility], recommended recheck 1 yr at vascular lab with Dr. Delana Meyer  . Thyroid nodule 02/2013   right upper lobe - s/p biopsy - benign follicular nodule  . Urine incontinence     Assessment: Pharmacy consulted for heparin dosing and  Monitoring in 84yo  male with right leg DVT. Patient was taking apixaban 2.5mg  BID prior to admission.  Last dose reported at 1000 this morning (5/28).   Goal of Therapy:  Heparin level 0.3-0.7 units/ml aPTT 66-102 seconds Monitor platelets by anticoagulation protocol: Yes   Plan:  Baseline labs ordered Give 4500 units bolus x 1 Start heparin infusion at 1350 units/hr Will need to adjust heparin level based on aPTTs until aPTT and HLs correlate.  Check anti-Xa and aPTT in 8 hours and daily while on heparin Continue to monitor H&H and platelets  5/29 AM aPTT >160. Hold drip x 1 hour and restart at 1150 units/hr. Recheck aPTT 6 hours after restart  Eloise Harman, PharmD, BCPS Clinical Pharmacist 08/25/2017 6:18 AM

## 2017-08-26 ENCOUNTER — Inpatient Hospital Stay: Payer: Medicare Other

## 2017-08-26 LAB — GLUCOSE, CAPILLARY: Glucose-Capillary: 91 mg/dL (ref 65–99)

## 2017-08-26 MED ORDER — CEPHALEXIN 500 MG PO CAPS: 500.0000 mg | ORAL_CAPSULE | Freq: Two times a day (BID) | ORAL | 0 refills | Status: AC

## 2017-08-26 MED ORDER — ONDANSETRON HCL 4 MG PO TABS: 4.0000 mg | ORAL_TABLET | Freq: Four times a day (QID) | ORAL | 0 refills | Status: AC | PRN

## 2017-08-26 NOTE — Progress Notes (Signed)
Patient stated he was not hurting. Patient is almost deaf, one hearing aid to left ear, which not effective. See flowsheet for VS. Ambulating with walker with PT

## 2017-08-26 NOTE — Care Management Note (Signed)
Case Management Note  Patient Details  Name: Charles Rodgers MRN: [id number] Date of Birth: [birth year]  Subjective/Objective:  RNCM consult received for home health services. Patient discharging today to home where he lives alone. He has several family members that are in and out of the home 3-4 times a day checking in on him. He uses a cane and a walker. He had a fall today in the hospital without injury. Offered son list of home health agencies. No preference. Referral to [facility] for RN, PT and HHA. [name] will be main contact person. [name] updated. Son appreciative for all the assistance and information.                    Action/Plan:   Expected Discharge Date:  08/26/17               Expected Discharge Plan:  [place]  In-House Referral:     Discharge planning Services  CM Consult  Post Acute Care Choice:  Home Health Choice offered to:  Adult Children  DME Arranged:    DME Agency:     HH Arranged:  RN, PT, Nurse's Aide [place] Agency:  [place]  Status of Service:  Completed, signed off  If discussed at [place] of Stay Meetings, dates discussed:    Additional Comments:  Jolly Mango, RN 08/26/2017, 3:12 PM

## 2017-08-26 NOTE — Progress Notes (Signed)
Patient had a fall, landed on side, denies headache.  We will do post fall assessment including x-ray of both hips, CT head.  If they are normal patient can still go home.  Discussed with sister-in-law.

## 2017-08-26 NOTE — Progress Notes (Signed)
Xray of both hips,CT head unremarkable.discharge home with Home health RN,d/w family.pt to follow with PMD for out pt ultrasounf of right leg to follow on DVT<scheduled for Ultrasound as per PCP note on 6//05/2017

## 2017-08-26 NOTE — Evaluation (Signed)
Physical Therapy Evaluation Patient Details Name: Charles Rodgers MRN: [id number] DOB: 11-02-1924 Today's Date: 08/26/2017   History of Present Illness  84 y/o male here with acute on chronic renal insufficiency, has R LE cellulitis/DVT.  history of BPH, COPD, diabetes mellitus type 2, hearing loss, hyperlipidemia, hypertension  Clinical Impression  Pt was able to ambulate confidently around the nurses' station using walker, unclear if he always uses walker (secondary to difficulty communicating/pt HOH) but he was comfortable and safe with it.  Pt able to gather small bits of gait training cuing, but hearing limitations make extensive gait training difficult.  Pt surprisingly did very well and showed no real safety concerns.  Pt's vitals remained stable t/o the effort, no further PT needed.     Follow Up Recommendations No PT follow up    Equipment Recommendations       Recommendations for Other Services       Precautions / Restrictions Precautions Precautions: Fall Restrictions Weight Bearing Restrictions: No      Mobility  Bed Mobility Overal bed mobility: Modified Independent             General bed mobility comments: Pt able to get up to sitting EOB w/o assist  Transfers Overall transfer level: Modified independent Equipment used: Rolling walker (2 wheeled)             General transfer comment: Pt able to rise to standing with only slight (visual) cuing for safety,   Ambulation/Gait Ambulation/Gait assistance: Supervision Ambulation Distance (Feet): 200 Feet Assistive device: Rolling walker (2 wheeled)       General Gait Details: Pt able to walk with relatively safe, consistent and appropriate cadence.  Showed good overall confidence, mostly directional cuing.  Stairs            Wheelchair Mobility    Modified Rankin (Stroke Patients Only)       Balance Overall balance assessment: Independent                                            Pertinent Vitals/Pain Pain Assessment: No/denies pain(unrated, mild R LE soreness from swelling)    Home Living Family/patient expects to be discharged to:: Private residence Living Arrangements: Alone Available Help at Discharge: Family(niece and sister-in-law assist (QD?)) Type of Home: (unable to gather much living situation info 2/2 [facility])                Prior Function           Comments: difficult to gather much info, pt states he has a walker (unclear if he typically uses it?)     Hand Dominance        Extremity/Trunk Assessment   Upper Extremity Assessment Upper Extremity Assessment: Generalized weakness;Overall WFL for tasks assessed(difficult to assess due to hearing difficulty)    Lower Extremity Assessment Lower Extremity Assessment: Overall WFL for tasks assessed;Generalized weakness(difficult to assess due to hearing difficulty)       Communication   Communication: HOH(severe hearing limitations, )  Cognition Arousal/Alertness: Awake/alert Behavior During Therapy: Restless Overall Cognitive Status: Within Functional Limits for tasks assessed(difficult to assess 2/2 hearing, appears age appropriate)  General Comments      Exercises     Assessment/Plan    PT Assessment Patent does not need any further PT services  PT Problem List         PT Treatment Interventions      PT Goals (Current goals can be found in the Care Plan section)  Acute Rehab PT Goals Patient Stated Goal: go home today PT Goal Formulation: All assessment and education complete, DC therapy    Frequency     Barriers to discharge        Co-evaluation               AM-PAC PT "6 Clicks" Daily Activity  Outcome Measure Difficulty turning over in bed (including adjusting bedclothes, sheets and blankets)?: None Difficulty moving from lying on back to sitting on the side of the bed? :  None Difficulty sitting down on and standing up from a chair with arms (e.g., wheelchair, bedside commode, etc,.)?: None Help needed moving to and from a bed to chair (including a wheelchair)?: None Help needed walking in hospital room?: None Help needed climbing 3-5 steps with a railing? : A Little 6 Click Score: 23    End of Session Equipment Utilized During Treatment: Gait belt Activity Tolerance: Patient tolerated treatment well Patient left: with chair alarm set;with call bell/phone within reach Nurse Communication: Mobility status PT Visit Diagnosis: Muscle weakness (generalized) (M62.81);Difficulty in walking, not elsewhere classified (R26.2)    Time: 3500-9381 PT Time Calculation (min) (ACUTE ONLY): 24 min   Charges:   PT Evaluation $PT Eval Low Complexity: 1 Low PT Treatments $Gait Training: 8-22 mins        Kreg Shropshire, DPT 08/26/2017, 9:34 AM

## 2017-08-27 ENCOUNTER — Telehealth: Payer: Self-pay | Admitting: *Deleted

## 2017-08-27 NOTE — Telephone Encounter (Signed)
Venous US set up for Monday June 3rd at 2:30pm at [facility], gave info to Daniel patients niece.

## 2017-08-27 NOTE — Telephone Encounter (Signed)
Lm requesting return call to complete TCM and confirm hosp f/u appt  

## 2017-08-27 NOTE — Discharge Summary (Signed)
Charles Rodgers [facility], is a 84 y.o. male  DOB [birth year]  MRN [id number].  Admission date:  08/24/2017  Admitting Physician  Epifanio Lesches, MD  Discharge Date:  08/26/2017   Primary MD  [name], MD  Recommendations for primary care physician for things to follow:  Follow-up with PCP in 3 to 4 days   Admission Diagnosis  Acute on chronic renal insufficiency [N28.9, N18.9] Cellulitis and abscess of right lower extremity [L03.115, L02.415]   Discharge Diagnosis  Acute on chronic renal insufficiency [N28.9, N18.9] Cellulitis and abscess of right lower extremity [L03.115, L02.415]   Active Problems:   * No active hospital problems. *      Past Medical History:  Diagnosis Date  . Abscess of axilla, left 02/07/2015  . Arthritis   . BPH (benign prostatic hypertrophy)    with elevated PSA, s/p benign biopsy, followed yearly by [facility] (last seen 01/16/2013)  . CKD (chronic kidney disease) stage 3, GFR 30-59 ml/min ([place])    [place] -> 2017 requested PCP start following  . COPD (chronic obstructive pulmonary disease) ([place]) 2016   by xray  . Diabetes mellitus type 2, controlled ([place])   . Glaucoma   . Hearing loss   . HLD (hyperlipidemia)   . HTN (hypertension)   . IgM monoclonal gammopathy of uncertain significance 08/27/2014   07/2014 suggest rpt 6 mo.  . Malignant melanoma of left upper arm ([place]) 11/14/2014   s/p excision complicated by infected axillary seroma (Dr Carmin Muskrat)  . Melanoma ([place]) 2014   nose and left hand  . Secondary hyperparathyroidism ([place])   . Subclavian artery stenosis, right ([place]) 2014   found on carotid US, seen [facility], recommended recheck 1 yr at vascular lab with Dr. Delana Meyer  . Thyroid nodule 02/2013   right upper lobe - s/p biopsy - benign follicular nodule  . Urine incontinence      Past Surgical History:  Procedure Laterality Date  . APPENDECTOMY  1938  . arm surgery     Right after trauma  . BIOPSY THYROID Right 58/0998   benign follicular nodule  . carotid US  07/2007   WNL  . 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 US ECHOCARDIOGRAPHY  06/2007   EF 55%, mod dil LA, mild LVH,mild pulm HTN       History of present illness and  Hospital Course:     Kindly see H&P for history of present illness and admission details, please review complete Labs, Consult reports and Test reports for all details in brief  HPI  from the history and physical done on the day of admission 84 year old male patient with severe deafness admitted for worsening right leg cellulitis despite antibiotics.   Hospital Course  Right leg cellulitis ; hospital, started on IV Ancef failed outpatient antibiotic therapy, patient admitted to, patient received IV Ancef for 3 days, discharged home to continue Keflex 500 mg twice daily for 10 days.  Right leg swelling, redness improved. 2.  Isolated peroneal DVT, patient is seen by vascular surgeon did not recommend IVC filter, initially he was on heparin drip but after vascular surgery consult started back on Eliquis, patient to have repeat ultrasound in 3 to 5 days to check on propagation of DVT.  This has been conveyed to his PCP, patient is scheduled to have repeat ultrasound of right leg for evaluation of the DVT to check if it is propagating more than usual (same.  this discussed with patient's niece, sister-in-law who are mainly involved his care. 3.  Deconditioning, fall: Patient had a fall yesterday, x-rays of both hips are normal, CT head unremarkable, discharged home with home health physical therapy, home health aide.  Patient very hard of hearing and he lives alone.  #4 essential hypertension: Controlled. 5.  History of chronic A. fib, rate controlled, continue apixaban 2.5 mg p.o. twice  daily. #6 history of iron deficiency anemia, anemia of chronic kidney disease, patient follows up with Dr. Mike Gip for EPO injections. 7.  Chronic diastolic heart failure, EF 65% by echo in April 2019. 7.  CKD stage III: Stable.  Discharge Condition:stable Discussed the findings of his blood work and also follow-ups and the need for repeat ultrasound of the right leg with patient's niece and also sister-in-law who takes care of him.  Follow UP  Follow-up Information    [name], MD Follow up.   Specialty:  Family Medicine Why:  08/31/2017 please infor family to go  for ultrasound on 08/30/2017/ already scheduled Contact information: [place] [address] [phone]             Discharge Instructions  and  Discharge Medications     Allergies as of 08/26/2017   No Known Allergies     Medication List    STOP taking these medications   docusate sodium 100 MG capsule Commonly known as:  COLACE   doxycycline 100 MG capsule Commonly known as:  VIBRAMYCIN     TAKE these medications   amLODipine 10 MG tablet Commonly known as:  NORVASC Take 1 tablet (10 mg total) by mouth daily.   apixaban 2.5 MG Tabs tablet Commonly known as:  ELIQUIS Take 1 tablet (2.5 mg total) by mouth 2 (two) times daily.   Azelastine HCl 137 MCG/SPRAY Soln Place 1-2 sprays into both nostrils at bedtime.   cephALEXin 500 MG capsule Commonly known as:  KEFLEX Take 1 capsule (500 mg total) by mouth 2 (two) times daily for 10 days.   dorzolamide-timolol 22.3-6.8 MG/ML ophthalmic solution Commonly known as:  COSOPT Place 1 drop into both eyes 2 (two) times daily.   doxazosin 8 MG tablet Commonly known as:  CARDURA TAKE 1 TABLET BY MOUTH AT BEDTIME   ferrous sulfate 325 (65 FE) MG tablet TAKE 1 TABLET BY MOUTH ONCE A DAY WITH BREAKFAST What changed:  See the new instructions.   fluticasone 50 MCG/ACT nasal spray Commonly known as:  FLONASE Place 2 sprays  into both nostrils daily.   furosemide 40 MG tablet Commonly known as:  LASIX Take 1 tablet (40 mg total) by mouth 2 (two) times daily.   glucose blood test strip Commonly known as:  ONE TOUCH ULTRA TEST USE TO CHECK BLOOD SUGAR 3 TIMES DAILY Dx: E11.42   latanoprost 0.005 % ophthalmic solution Commonly known as:  XALATAN Place 1 drop into both eyes at bedtime.   lisinopril 20 MG tablet Commonly known as:  PRINIVIL,ZESTRIL Take 1 tablet (20 mg total) by mouth 2 (two) times daily.   loratadine 10 MG tablet Commonly known as:  CLARITIN Take 1 tablet (10 mg total) by mouth daily.   ondansetron 4 MG tablet Commonly known as:  ZOFRAN Take 1 tablet (4 mg total) by mouth every 6 (six) hours as needed for nausea.   pravastatin 40 MG tablet Commonly known as:  PRAVACHOL TAKE 1 TABLET BY MOUTH AT BEDTIME   psyllium 58.6 % packet Commonly known  as:  METAMUCIL Take 1 packet by mouth at bedtime.   spironolactone 25 MG tablet Commonly known as:  ALDACTONE Take 0.5 tablets (12.5 mg total) by mouth every other day.   triamcinolone cream 0.1 % Commonly known as:  KENALOG Apply 1 application topically 2 (two) times daily.   Vitamin D 2000 units Caps Take 1 capsule (2,000 Units total) by mouth daily.         Diet and Activity recommendation: See Discharge Instructions above   Consults obtained - PT   Major procedures and Radiology Reports - PLEASE review detailed and final reports for all details, in brief -      Ct Head Wo Contrast  Result Date: 08/26/2017 CLINICAL DATA:  Headache following fall EXAM: CT HEAD WITHOUT CONTRAST TECHNIQUE: Contiguous axial images were obtained from the base of the skull through the vertex without intravenous contrast. COMPARISON:  March 01, 2015 FINDINGS: Brain: There is mild diffuse atrophy. There is invagination of CSF into the sella. There is no intracranial mass, hemorrhage, extra-axial fluid collection, or midline shift. There is  slight small vessel disease in the centra semiovale bilaterally. No acute infarct evident. Vascular: No hyperdense vessel. There is calcification in each carotid siphon region as well as in the distal right vertebral artery. Skull: The bony calvarium appears intact. Sinuses/Orbits: There is mucosal thickening in several ethmoid air cells. There is also slight mucosal thickening in the anterior sphenoid sinuses. Other visualized paranasal sinuses are clear. Frontal sinuses are aplastic. Orbits appear symmetric bilaterally. Other: There is opacification of virtually all mastoid air cells bilaterally. IMPRESSION: Atrophy with mild periventricular small vessel disease. No acute infarct. No mass or hemorrhage. There is persistent empty sella. Significance of this finding uncertain. Extensive mastoid air cell disease bilaterally. Mucosal thickening noted in several ethmoid air cells. Multiple foci of arterial vascular calcification noted. Electronically Signed   By: Lowella Grip III M.D.   On: 08/26/2017 13:17   US Venous Img Lower Unilateral Right  Result Date: 08/24/2017 CLINICAL DATA:  Right lower extremity edema and pain. EXAM: RIGHT LOWER EXTREMITY VENOUS DOPPLER ULTRASOUND TECHNIQUE: Gray-scale sonography with graded compression, as well as color Doppler and duplex ultrasound were performed to evaluate the lower extremity deep venous systems from the level of the common femoral vein and including the common femoral, femoral, profunda femoral, popliteal and calf veins including the posterior tibial, peroneal and gastrocnemius veins when visible. The superficial great saphenous vein was also interrogated. Spectral Doppler was utilized to evaluate flow at rest and with distal augmentation maneuvers in the common femoral, femoral and popliteal veins. COMPARISON:  None. FINDINGS: Contralateral Common Femoral Vein: Respiratory phasicity is normal and symmetric with the symptomatic side. No evidence of thrombus.  Normal compressibility. Common Femoral Vein: No evidence of thrombus. Normal compressibility, respiratory phasicity and response to augmentation. Saphenofemoral Junction: No evidence of thrombus. Normal compressibility and flow on color Doppler imaging. Profunda Femoral Vein: No evidence of thrombus. Normal compressibility and flow on color Doppler imaging. Femoral Vein: No evidence of thrombus. Normal compressibility, respiratory phasicity and response to augmentation. Popliteal Vein: No evidence of thrombus. Normal compressibility, respiratory phasicity and response to augmentation. Calf Veins: There appears to be some thrombus in the right peroneal vein of the calf. Other visualized calf veins are normally patent. Superficial Great Saphenous Vein: No evidence of thrombus. Normal compressibility. Venous Reflux:  None. Other Findings: No evidence of superficial thrombophlebitis or abnormal fluid collection. IMPRESSION: Right lower extremity DVT isolated to the peroneal vein  of the calf. Electronically Signed   By: Aletta Edouard M.D.   On: 08/24/2017 16:39   Dg Hip Unilat With Pelvis 2-3 Views Left  Result Date: 08/26/2017 CLINICAL DATA:  Acute bilateral hip pain after fall today. EXAM: DG HIP (WITH OR WITHOUT PELVIS) 2-3V LEFT COMPARISON:  None. FINDINGS: There is no evidence of hip fracture or dislocation. There is no evidence of arthropathy or other focal bone abnormality. IMPRESSION: Normal left hip. Electronically Signed   By: Marijo Conception, M.D.   On: 08/26/2017 13:30   Dg Hip Unilat With Pelvis 2-3 Views Right  Result Date: 08/26/2017 CLINICAL DATA:  Acute bilateral hip pain after fall today. EXAM: DG HIP (WITH OR WITHOUT PELVIS) 2-3V RIGHT COMPARISON:  None. FINDINGS: There is no evidence of hip fracture or dislocation. There is no evidence of arthropathy or other focal bone abnormality. IMPRESSION: Normal right hip. Electronically Signed   By: Marijo Conception, M.D.   On: 08/26/2017 13:31     Micro Results     No results found for this or any previous visit (from the past 240 hour(s)).     Today   Subjective:   Xadrian Grimme today has no headache,no chest abdominal pain,no new weakness tingling or numbness, feels much better wants to go home today.  Objective:   Blood pressure (!) 151/70, pulse 74, temperature 98 F (36.7 C), temperature source Oral, resp. rate 20, height 5\' 7"  (1.702 m), weight 85.1 kg (187 lb 9.8 oz), SpO2 94 %.  No intake or output data in the 24 hours ending 08/27/17 1238  Exam Awake Alert, Oriented x 3, No new F.N deficits, Normal affect [place].AT,PERRAL Supple Neck,No JVD, No cervical lymphadenopathy appriciated.  Symmetrical Chest wall movement, Good air movement bilaterally, CTAB RRR,No Gallops,Rubs or new Murmurs, No Parasternal Heave +ve B.Sounds, Abd Soft, Non tender, No organomegaly appriciated, No rebound -guarding or rigidity. No Cyanosis, Clubbing or edema, No new Rash or bruise  Data Review   CBC w Diff:  Lab Results  Component Value Date   WBC 5.2 08/25/2017   HGB 7.2 (L) 08/25/2017   HGB 9.3 12/21/2014   HCT 22.4 (L) 08/25/2017   HCT 29.7 (L) 06/26/2011   PLT 117 (L) 08/25/2017   PLT 78 (L) 06/26/2011   LYMPHOPCT 7 08/24/2017   LYMPHOPCT 13.5 06/26/2011   MONOPCT 10 08/24/2017   MONOPCT 10.6 06/26/2011   EOSPCT 1 08/24/2017   EOSPCT 1.6 06/26/2011   BASOPCT 1 08/24/2017   BASOPCT 0.3 06/26/2011    CMP:  Lab Results  Component Value Date   NA 138 08/25/2017   NA 140 06/15/2014   NA 136 06/26/2011   K 4.3 08/25/2017   K 3.9 12/21/2014   CL 111 08/25/2017   CL 107 06/26/2011   CO2 20 (L) 08/25/2017   CO2 17 (L) 06/26/2011   BUN 88 (H) 08/25/2017   BUN 43 (A) 06/15/2014   BUN 33 (H) 06/26/2011   CREATININE 1.92 (H) 08/25/2017   CREATININE 1.61 12/21/2014   GLU 87 07/13/2011   PROT 6.5 08/24/2017   PROT 6.9 07/13/2011   PROT 6.7 06/24/2011   ALBUMIN 3.0 (L) 08/24/2017   ALBUMIN 4.1 06/06/2012    ALBUMIN 3.1 (L) 06/24/2011   BILITOT 0.5 08/24/2017   BILITOT 0.4 07/13/2011   ALKPHOS 60 08/24/2017   ALKPHOS 65 07/13/2011   AST 19 08/24/2017   AST 18 07/13/2011   ALT 15 (L) 08/24/2017   ALT 34 06/24/2011  .  Total Time in preparing paper work, data evaluation and todays exam - 19 minutes  Epifanio Lesches M.D on 5/302019 at 12:38 PM    Note: This dictation was prepared with Dragon dictation along with smaller phrase technology. Any transcriptional errors that result from this process are unintentional.

## 2017-08-30 ENCOUNTER — Ambulatory Visit
Admission: RE | Admit: 2017-08-30 | Discharge: 2017-08-30 | Disposition: A | Discharge status: Home / Self Care | Payer: Medicare Other | Source: Ambulatory Visit | Attending: Family Medicine | Admitting: Family Medicine

## 2017-08-30 DIAGNOSIS — I82451 Acute embolism and thrombosis of right peroneal vein: Secondary | ICD-10-CM

## 2017-08-30 DIAGNOSIS — I82491 Acute embolism and thrombosis of other specified deep vein of right lower extremity: Secondary | ICD-10-CM | POA: Diagnosis not present

## 2017-08-30 DIAGNOSIS — R600 Localized edema: Secondary | ICD-10-CM | POA: Diagnosis not present

## 2017-08-30 NOTE — Telephone Encounter (Signed)
Spoke to pt's niece Oliver Pila who states pt is hard of hearing and unable to complete TCM call via phone. Deborah not on DPR to complete. Lm on pt's son's vm requesting a call back

## 2017-08-31 ENCOUNTER — Ambulatory Visit (INDEPENDENT_AMBULATORY_CARE_PROVIDER_SITE_OTHER): Payer: Medicare Other | Admitting: Family Medicine

## 2017-08-31 ENCOUNTER — Encounter: Payer: Self-pay | Admitting: Family Medicine

## 2017-08-31 VITALS — BP 138/66 | HR 54 | Temp 98.3°F | Ht 67.0 in | Wt 189.0 lb

## 2017-08-31 DIAGNOSIS — D631 Anemia in chronic kidney disease: Secondary | ICD-10-CM

## 2017-08-31 DIAGNOSIS — I4891 Unspecified atrial fibrillation: Secondary | ICD-10-CM

## 2017-08-31 DIAGNOSIS — E1121 Type 2 diabetes mellitus with diabetic nephropathy: Secondary | ICD-10-CM

## 2017-08-31 DIAGNOSIS — H9193 Unspecified hearing loss, bilateral: Secondary | ICD-10-CM | POA: Diagnosis not present

## 2017-08-31 DIAGNOSIS — I82451 Acute embolism and thrombosis of right peroneal vein: Secondary | ICD-10-CM

## 2017-08-31 DIAGNOSIS — N184 Chronic kidney disease, stage 4 (severe): Secondary | ICD-10-CM

## 2017-08-31 DIAGNOSIS — E1122 Type 2 diabetes mellitus with diabetic chronic kidney disease: Secondary | ICD-10-CM | POA: Diagnosis not present

## 2017-08-31 DIAGNOSIS — R600 Localized edema: Secondary | ICD-10-CM | POA: Diagnosis not present

## 2017-08-31 DIAGNOSIS — I82491 Acute embolism and thrombosis of other specified deep vein of right lower extremity: Secondary | ICD-10-CM | POA: Diagnosis not present

## 2017-08-31 DIAGNOSIS — L03115 Cellulitis of right lower limb: Secondary | ICD-10-CM

## 2017-08-31 DIAGNOSIS — Z7189 Other specified counseling: Secondary | ICD-10-CM

## 2017-08-31 NOTE — Progress Notes (Signed)
BP 138/66 (BP Location: Left Arm, Patient Position: Sitting, Cuff Size: Normal)   Pulse (!) 54   Temp 98.3 F (36.8 C) (Oral)   Ht 5\' 7"  (1.702 m)   Wt 189 lb (85.7 kg)   SpO2 96%   BMI 29.60 kg/m    CC: hosp f/u visit Subjective:    Patient ID: Charles Rodgers, male    DOB: [birth year], 84 y.o.   MRN: [id number]  HPI: Charles Rodgers is a 84 y.o. male presenting on 08/31/2017 for Hospitalization Follow-up (Ad[facility] to [facility] on 08/24/17, primary dx cellulitis and abscess of lower right LE. Pt accompanied by niece, [name] and sister-in-law, Narda Rutherford.)   See prior note for details. Hospitalized late last month after failed outpatient management of RLE cellulitis  with oral doxy/keflex. Placed on IV ancef. Found to have isolated peroneal DVT. He did receive IV heparin. He had fall during hospitalization, xrays and head CT were stable. Discharged with [facility] PT, aide. discharged with another keflex course 500mg  bid. Rpt [place] yesterday did not show propagation of DVT but rather clearing.   Lives at home alone. Widower. Daughter visited last week, lives in [place]. Niece and sister in law act as his caregivers. Pt refuses further assistance at home but wants to stay at home.   Admission date:  08/24/2017  Discharge Date:  08/26/2017  TCM hosp f/u phone call attempted 08/27/2017 and again 08/30/2017, pt unable to complete as hard of hearing.  Recommendations for primary care physician for things to follow:  Follow-up with PCP in 3 to 4 days  Admission Diagnosis  Acute on chronic renal insufficiency [N28.9, N18.9] Cellulitis and abscess of right lower extremity [L03.115, L02.415]   Discharge Diagnosis  Acute on chronic renal insufficiency [N28.9, N18.9] Cellulitis and abscess of right lower extremity [L03.115, L02.415]   Relevant past medical, surgical, family and social history reviewed and updated as indicated. Interim medical history since our last visit reviewed. Allergies and  medications reviewed and updated. Outpatient Medications Prior to Visit  Medication Sig Dispense Refill  . amLODipine (NORVASC) 10 MG tablet Take 1 tablet (10 mg total) by mouth daily. 90 tablet 3  . apixaban (ELIQUIS) 2.5 MG TABS tablet Take 1 tablet (2.5 mg total) by mouth 2 (two) times daily. 60 tablet 6  . Azelastine HCl 137 MCG/SPRAY SOLN Place 1-2 sprays into both nostrils at bedtime.     . cephALEXin (KEFLEX) 500 MG capsule Take 1 capsule (500 mg total) by mouth 2 (two) times daily for 10 days. 20 capsule 0  . Cholecalciferol (VITAMIN D) 2000 units CAPS Take 1 capsule (2,000 Units total) by mouth daily. 30 capsule   . dorzolamide-timolol (COSOPT) 22.3-6.8 MG/ML ophthalmic solution Place 1 drop into both eyes 2 (two) times daily. 10 mL   . doxazosin (CARDURA) 8 MG tablet TAKE 1 TABLET BY MOUTH AT BEDTIME 90 tablet 1  . ferrous sulfate 325 (65 FE) MG tablet TAKE 1 TABLET BY MOUTH ONCE A DAY WITH BREAKFAST (Patient taking differently: TAKE 2 TABLETS BY MOUTH ONCE A DAY WITH BREAKFAST) 90 tablet 0  . fluticasone (FLONASE) 50 MCG/ACT nasal spray Place 2 sprays into both nostrils daily. 16 g 3  . furosemide (LASIX) 40 MG tablet Take 1 tablet (40 mg total) by mouth 2 (two) times daily. 180 tablet 3  . glucose blood (ONE TOUCH ULTRA TEST) test strip USE TO CHECK BLOOD SUGAR 3 TIMES DAILY Dx: E11.42 300 each 2  . latanoprost (XALATAN) 0.005 % ophthalmic  solution Place 1 drop into both eyes at bedtime. 2.5 mL   . lisinopril (PRINIVIL,ZESTRIL) 20 MG tablet Take 1 tablet (20 mg total) by mouth 2 (two) times daily. 180 tablet 1  . loratadine (CLARITIN) 10 MG tablet Take 1 tablet (10 mg total) by mouth daily. 30 tablet 3  . ondansetron (ZOFRAN) 4 MG tablet Take 1 tablet (4 mg total) by mouth every 6 (six) hours as needed for nausea. 20 tablet 0  . pravastatin (PRAVACHOL) 40 MG tablet TAKE 1 TABLET BY MOUTH AT BEDTIME 90 tablet 1  . psyllium (METAMUCIL) 58.6 % packet Take 1 packet by mouth at bedtime.      Marland Kitchen spironolactone (ALDACTONE) 25 MG tablet Take 0.5 tablets (12.5 mg total) by mouth every other day. 15 tablet 3  . triamcinolone cream (KENALOG) 0.1 % Apply 1 application topically 2 (two) times daily. 454 g 0   No facility-administered medications prior to visit.      Per HPI unless specifically indicated in ROS section below Review of Systems     Objective:    BP 138/66 (BP Location: Left Arm, Patient Position: Sitting, Cuff Size: Normal)   Pulse (!) 54   Temp 98.3 F (36.8 C) (Oral)   Ht 5\' 7"  (1.702 m)   Wt 189 lb (85.7 kg)   SpO2 96%   BMI 29.60 kg/m   Wt Readings from Last 3 Encounters:  08/31/17 189 lb (85.7 kg)  08/26/17 187 lb 9.8 oz (85.1 kg)  08/24/17 187 lb 8 oz (85 kg)    Physical Exam  Constitutional: He appears well-developed and well-nourished. No distress.  HENT:  Mouth/Throat: Oropharynx is clear and moist.  Cardiovascular: Normal rate, regular rhythm and normal heart sounds.  No murmur heard. Pulmonary/Chest: Effort normal and breath sounds normal. No respiratory distress. He has no wheezes. He has no rales.  Musculoskeletal: He exhibits edema (3+ pitting to knees bilaterally).  Improving RLE erythema  Nursing note and vitals reviewed.  Lab Results  Component Value Date   CREATININE 1.92 (H) 08/25/2017   BUN 88 (H) 08/25/2017   NA 138 08/25/2017   K 4.3 08/25/2017   CL 111 08/25/2017   CO2 20 (L) 08/25/2017    Lab Results  Component Value Date   WBC 5.2 08/25/2017   HGB 7.2 (L) 08/25/2017   HCT 22.4 (L) 08/25/2017   MCV 89.6 08/25/2017   PLT 117 (L) 08/25/2017    Lab Results  Component Value Date   HGBA1C 6.3 03/25/2017       Assessment & Plan:  Next appt is at end of month for physical.  Problem List Items Addressed This Visit    Anemia in chronic kidney disease    Marked anemia despite procrit. Has next procrit injection this Friday.       Atrial fibrillation with slow ventricular response (HCC)   Cellulitis of right anterior  lower leg - Primary    Continues improving after IV ancef and continued keflex course. Recent peroneal DVT may have complicated healing, now seems resolved. Chronic severe pedal edema also complicates healing.       CKD stage 4 due to type 2 diabetes mellitus (HCC)    Latest GFR 29, stable over the past year.       Controlled type 2 diabetes mellitus with diabetic nephropathy, without long-term current use of insulin (HCC)    Diet controlled.       Goals of care, counseling/discussion    Deafness complicates communication. Per  family, refuses leaving home, but also refuses help to come out to home. Currently HH involved - hopeful for social work to evaluate situation. He has already declined nurse aide. Spoke with family about personal care services, [place] senior resources sheet provided as well as [employer]. Advised patient he needs to look into med alert button.       HOH (hard of hearing)    This complicates communication. He also has poor vision making it difficult for him to read communication attempts.      Pedal edema    Severe - double lasix for 3 days, if ineffective, consider changing to different loop diuretic. Did not receive IV diuresis during recent hospitalization.      Peroneal DVT (deep venous thrombosis), right ([place])    Rpt LE US showed clearing of small peroneal DVT. He did receive short course of IV heparin while hospitalized, now back on eliquis.           No orders of the defined types were placed in this encounter.  No orders of the defined types were placed in this encounter.   Follow up plan: No follow-ups on file.  [name], MD

## 2017-08-31 NOTE — Patient Instructions (Signed)
I recommend at minimum life alert system - brochure provided today. Consider personal care services for help at home.  [place] senior resources.  Finish keflex antibiotic. Double up on lasix 40mg  to 2 pills twice daily for 3 days.

## 2017-09-01 NOTE — Assessment & Plan Note (Signed)
Severe - double lasix for 3 days, if ineffective, consider changing to different loop diuretic. Did not receive IV diuresis during recent hospitalization.

## 2017-09-01 NOTE — Assessment & Plan Note (Signed)
Latest GFR 29, stable over the past year.

## 2017-09-01 NOTE — Assessment & Plan Note (Signed)
Marked anemia despite procrit. Has next procrit injection this Friday.

## 2017-09-01 NOTE — Assessment & Plan Note (Signed)
Continues improving after IV ancef and continued keflex course. Recent peroneal DVT may have complicated healing, now seems resolved. Chronic severe pedal edema also complicates healing.

## 2017-09-01 NOTE — Assessment & Plan Note (Signed)
Deafness complicates communication. Per family, refuses leaving home, but also refuses help to come out to home. Currently HH involved - hopeful for social work to evaluate situation. He has already declined nurse aide. Spoke with family about personal care services,  senior resources sheet provided as well as [employer]. Advised patient he needs to look into med alert button.

## 2017-09-01 NOTE — Assessment & Plan Note (Addendum)
This complicates communication. He also has poor vision making it difficult for him to read communication attempts.

## 2017-09-01 NOTE — Assessment & Plan Note (Signed)
Diet controlled.  

## 2017-09-01 NOTE — Assessment & Plan Note (Signed)
Rpt LE US showed clearing of small peroneal DVT. He did receive short course of IV heparin while hospitalized, now back on eliquis.

## 2017-09-03 ENCOUNTER — Inpatient Hospital Stay: Payer: Medicare Other | Attending: Hematology and Oncology

## 2017-09-03 ENCOUNTER — Other Ambulatory Visit: Payer: Self-pay | Admitting: Urgent Care

## 2017-09-03 ENCOUNTER — Inpatient Hospital Stay: Payer: Medicare Other

## 2017-09-03 VITALS — BP 144/54

## 2017-09-03 DIAGNOSIS — N184 Chronic kidney disease, stage 4 (severe): Secondary | ICD-10-CM

## 2017-09-03 DIAGNOSIS — N183 Chronic kidney disease, stage 3 (moderate): Secondary | ICD-10-CM | POA: Insufficient documentation

## 2017-09-03 DIAGNOSIS — I129 Hypertensive chronic kidney disease with stage 1 through stage 4 chronic kidney disease, or unspecified chronic kidney disease: Secondary | ICD-10-CM | POA: Insufficient documentation

## 2017-09-03 DIAGNOSIS — N189 Chronic kidney disease, unspecified: Principal | ICD-10-CM

## 2017-09-03 DIAGNOSIS — D631 Anemia in chronic kidney disease: Secondary | ICD-10-CM

## 2017-09-03 LAB — SAMPLE TO BLOOD BANK

## 2017-09-03 LAB — HEMOGLOBIN: Hemoglobin: 6.9 g/dL — ABNORMAL LOW (ref 13.0–18.0)

## 2017-09-03 MED ORDER — EPOETIN ALFA 10000 UNIT/ML IJ SOLN
10000.0000 [IU] | Freq: Once | INTRAMUSCULAR | Status: AC
  Administered 2017-09-03: 10000 [IU] via SUBCUTANEOUS

## 2017-09-03 MED ORDER — ACETAMINOPHEN 325 MG PO TABS
650.0000 mg | ORAL_TABLET | Freq: Once | ORAL | Status: AC
  Administered 2017-09-03: 650 mg via ORAL
  Filled 2017-09-03: qty 2

## 2017-09-03 MED ORDER — SODIUM CHLORIDE 0.9 % IV SOLN
250.0000 mL | Freq: Once | INTRAVENOUS | Status: AC
  Administered 2017-09-03: 250 mL via INTRAVENOUS
  Filled 2017-09-03: qty 250

## 2017-09-03 MED ORDER — DIPHENHYDRAMINE HCL 25 MG PO CAPS
25.0000 mg | ORAL_CAPSULE | Freq: Once | ORAL | Status: AC
  Administered 2017-09-03: 25 mg via ORAL
  Filled 2017-09-03: qty 1

## 2017-09-03 NOTE — Progress Notes (Signed)
1200 - I was notified by lab that the patient had a low hemoglobin of 6.9. I spoke with infusion and SDS to see if we could get patient in for a transfusion today. Blood orders entered at 1231. We later learned that, despite the patient advising [place] that he had received blood transfusion before, he had indeed not. ABO/Rh was added by lab staff.   Blood bank notified infusion of a ABO/Rh incompatibility. We were advised that "front type and screen and back type and screen were not matching". Blood bank advising that this is sometimes seen in patients of advanced age. Additional lab testing had to be performed prior to blood units being available for allocation and transfusion. Patient upset and wanting to leave. Infusion staff did a wonderful job at redirecting the patient and making him comfortable to the point where he was amenable to waiting.   Several hours passed and we had not heard about blood. Blood back contacted again. We learned that they were still having trouble securing the ordered blood products for this patient. As of 1650, ABO/Rh testing still processing due to the aforementioned incompatibility. I spoke with attending oncology Mike Gip). Patient is not symptomatic above and beyond his normal fatigue. No increased shortness of breath or chest pain reported by patient. Vital signs reviewed as stable; blood pressure 144/54.  Will administer planned Procrit 10,000 unit dose. Patient will be scheduled for blood transfusion on Monday. Strict return precautions reviewed with the patient. He was advised to return a call to the center's on call provider, or present immediately to the ED for further evaluation, should he develop chest pain, SOB, palpitations, or any other concerning symptoms.    Honor Loh, MSN, APRN, FNP-C, CEN Oncology/Hematology Nurse Practitioner  [facility] 09/03/17 4:50 PM

## 2017-09-06 ENCOUNTER — Inpatient Hospital Stay: Payer: Medicare Other

## 2017-09-06 DIAGNOSIS — N189 Chronic kidney disease, unspecified: Principal | ICD-10-CM

## 2017-09-06 DIAGNOSIS — D631 Anemia in chronic kidney disease: Secondary | ICD-10-CM

## 2017-09-06 DIAGNOSIS — N183 Chronic kidney disease, stage 3 (moderate): Secondary | ICD-10-CM | POA: Diagnosis not present

## 2017-09-06 DIAGNOSIS — I129 Hypertensive chronic kidney disease with stage 1 through stage 4 chronic kidney disease, or unspecified chronic kidney disease: Secondary | ICD-10-CM | POA: Diagnosis not present

## 2017-09-06 LAB — CBC WITH DIFFERENTIAL/PLATELET
BASOS PCT: 1 %
Basophils Absolute: 0 10*3/uL (ref 0–0.1)
EOS PCT: 1 %
Eosinophils Absolute: 0 10*3/uL (ref 0–0.7)
HEMATOCRIT: 21.8 % — AB (ref 40.0–52.0)
Hemoglobin: 7 g/dL — ABNORMAL LOW (ref 13.0–18.0)
Lymphocytes Relative: 9 %
Lymphs Abs: 0.4 10*3/uL — ABNORMAL LOW (ref 1.0–3.6)
MCH: 28.9 pg (ref 26.0–34.0)
MCHC: 32.2 g/dL (ref 32.0–36.0)
MCV: 89.6 fL (ref 80.0–100.0)
MONOS PCT: 13 %
Monocytes Absolute: 0.6 10*3/uL (ref 0.2–1.0)
NEUTROS PCT: 76 %
Neutro Abs: 3.3 10*3/uL (ref 1.4–6.5)
Platelets: 81 10*3/uL — ABNORMAL LOW (ref 150–440)
RBC: 2.44 MIL/uL — ABNORMAL LOW (ref 4.40–5.90)
RDW: 16.1 % — ABNORMAL HIGH (ref 11.5–14.5)
WBC: 4.4 10*3/uL (ref 3.8–10.6)

## 2017-09-06 LAB — BASIC METABOLIC PANEL
Anion gap: 8 (ref 5–15)
BUN: 82 mg/dL — ABNORMAL HIGH (ref 6–20)
CALCIUM: 8.4 mg/dL — AB (ref 8.9–10.3)
CO2: 21 mmol/L — AB (ref 22–32)
CREATININE: 2.01 mg/dL — AB (ref 0.61–1.24)
Chloride: 107 mmol/L (ref 101–111)
GFR calc non Af Amer: 28 mL/min — ABNORMAL LOW (ref >60)
GFR, EST AFRICAN AMERICAN: 32 mL/min — AB (ref >60)
Glucose, Bld: 161 mg/dL — ABNORMAL HIGH (ref 65–99)
Potassium: 4.4 mmol/L (ref 3.5–5.1)
SODIUM: 136 mmol/L (ref 135–145)

## 2017-09-06 LAB — PREPARE RBC (CROSSMATCH)

## 2017-09-06 LAB — ABO/RH: ABO/RH(D): A POS

## 2017-09-06 MED ORDER — SODIUM CHLORIDE 0.9 % IV SOLN
INTRAVENOUS | Status: DC
  Administered 2017-09-06: 13:00:00 via INTRAVENOUS
  Filled 2017-09-06: qty 1000

## 2017-09-06 MED ORDER — ACETAMINOPHEN 325 MG PO TABS
650.0000 mg | ORAL_TABLET | Freq: Once | ORAL | Status: AC
  Administered 2017-09-06: 650 mg via ORAL
  Filled 2017-09-06: qty 2

## 2017-09-06 MED ORDER — DIPHENHYDRAMINE HCL 25 MG PO CAPS
25.0000 mg | ORAL_CAPSULE | Freq: Once | ORAL | Status: AC
  Administered 2017-09-06: 25 mg via ORAL
  Filled 2017-09-06: qty 1

## 2017-09-07 LAB — TYPE AND SCREEN
ABO/RH(D): A POS
Antibody Screen: NEGATIVE
Unit division: 0

## 2017-09-07 LAB — BPAM RBC
Blood Product Expiration Date: 201906212359
ISSUE DATE / TIME: 201906101310
Unit Type and Rh: 6200

## 2017-09-08 ENCOUNTER — Telehealth: Payer: Self-pay | Admitting: *Deleted

## 2017-09-08 ENCOUNTER — Encounter: Payer: Self-pay | Admitting: Urgent Care

## 2017-09-08 ENCOUNTER — Other Ambulatory Visit: Payer: Self-pay | Admitting: Urgent Care

## 2017-09-08 DIAGNOSIS — H906 Mixed conductive and sensorineural hearing loss, bilateral: Secondary | ICD-10-CM | POA: Diagnosis not present

## 2017-09-08 DIAGNOSIS — H6982 Other specified disorders of Eustachian tube, left ear: Secondary | ICD-10-CM | POA: Diagnosis not present

## 2017-09-08 DIAGNOSIS — D631 Anemia in chronic kidney disease: Secondary | ICD-10-CM

## 2017-09-08 DIAGNOSIS — D591 Other autoimmune hemolytic anemias: Secondary | ICD-10-CM

## 2017-09-08 DIAGNOSIS — H6522 Chronic serous otitis media, left ear: Secondary | ICD-10-CM | POA: Diagnosis not present

## 2017-09-08 DIAGNOSIS — N189 Chronic kidney disease, unspecified: Principal | ICD-10-CM

## 2017-09-08 DIAGNOSIS — H902 Conductive hearing loss, unspecified: Secondary | ICD-10-CM | POA: Diagnosis not present

## 2017-09-08 DIAGNOSIS — D5912 Cold autoimmune hemolytic anemia: Secondary | ICD-10-CM

## 2017-09-08 NOTE — Telephone Encounter (Signed)
This encounter was created in error - please disregard.

## 2017-09-08 NOTE — Telephone Encounter (Signed)
Per Gaspar Bidding 09/08/17 staff message to schedule lab this week.  Patient is scheduled on 6//14/19 @ 2:30 I called and left a message on his brother's (Richard's) vmail. To make him aware

## 2017-09-09 ENCOUNTER — Telehealth: Payer: Self-pay | Admitting: Family Medicine

## 2017-09-09 DIAGNOSIS — N183 Chronic kidney disease, stage 3 (moderate): Secondary | ICD-10-CM | POA: Diagnosis not present

## 2017-09-09 DIAGNOSIS — J449 Chronic obstructive pulmonary disease, unspecified: Secondary | ICD-10-CM | POA: Diagnosis not present

## 2017-09-09 DIAGNOSIS — R531 Weakness: Secondary | ICD-10-CM | POA: Diagnosis not present

## 2017-09-09 DIAGNOSIS — Z87891 Personal history of nicotine dependence: Secondary | ICD-10-CM | POA: Diagnosis not present

## 2017-09-09 DIAGNOSIS — M1990 Unspecified osteoarthritis, unspecified site: Secondary | ICD-10-CM | POA: Diagnosis not present

## 2017-09-09 DIAGNOSIS — Z7901 Long term (current) use of anticoagulants: Secondary | ICD-10-CM | POA: Diagnosis not present

## 2017-09-09 DIAGNOSIS — N400 Enlarged prostate without lower urinary tract symptoms: Secondary | ICD-10-CM | POA: Diagnosis not present

## 2017-09-09 DIAGNOSIS — I82591 Chronic embolism and thrombosis of other specified deep vein of right lower extremity: Secondary | ICD-10-CM | POA: Diagnosis not present

## 2017-09-09 DIAGNOSIS — E785 Hyperlipidemia, unspecified: Secondary | ICD-10-CM | POA: Diagnosis not present

## 2017-09-09 DIAGNOSIS — I129 Hypertensive chronic kidney disease with stage 1 through stage 4 chronic kidney disease, or unspecified chronic kidney disease: Secondary | ICD-10-CM | POA: Diagnosis not present

## 2017-09-09 DIAGNOSIS — H409 Unspecified glaucoma: Secondary | ICD-10-CM | POA: Diagnosis not present

## 2017-09-09 DIAGNOSIS — Z85828 Personal history of other malignant neoplasm of skin: Secondary | ICD-10-CM | POA: Diagnosis not present

## 2017-09-09 DIAGNOSIS — E1122 Type 2 diabetes mellitus with diabetic chronic kidney disease: Secondary | ICD-10-CM | POA: Diagnosis not present

## 2017-09-09 DIAGNOSIS — Z9181 History of falling: Secondary | ICD-10-CM | POA: Diagnosis not present

## 2017-09-09 NOTE — Telephone Encounter (Signed)
Pt last seen 08/31/17.

## 2017-09-09 NOTE — Telephone Encounter (Signed)
Copied from [place] [id number]. Topic: General - Other >> Sep 09, 2017  3:17 PM Margot Ables wrote: Reason for CRM: requesting VO for PT to work with pt 2x week for 2 weeks, 1x week for 1 week, total 3 weeks. Pt was seen today. His legs have +4 pitting edema. Recent DX cellulitis. Legs are very red. He is urinating quite often with the fluid pills he is on (lasix 2/day and spironolactone 1 every other day). R leg is worse than left leg. Edema is up to pts thighs.

## 2017-09-10 ENCOUNTER — Inpatient Hospital Stay: Payer: Medicare Other

## 2017-09-10 DIAGNOSIS — D631 Anemia in chronic kidney disease: Secondary | ICD-10-CM

## 2017-09-10 DIAGNOSIS — N189 Chronic kidney disease, unspecified: Principal | ICD-10-CM

## 2017-09-10 DIAGNOSIS — I129 Hypertensive chronic kidney disease with stage 1 through stage 4 chronic kidney disease, or unspecified chronic kidney disease: Secondary | ICD-10-CM | POA: Diagnosis not present

## 2017-09-10 DIAGNOSIS — D591 Other autoimmune hemolytic anemias: Secondary | ICD-10-CM

## 2017-09-10 DIAGNOSIS — D5912 Cold autoimmune hemolytic anemia: Secondary | ICD-10-CM

## 2017-09-10 DIAGNOSIS — N183 Chronic kidney disease, stage 3 (moderate): Secondary | ICD-10-CM | POA: Diagnosis not present

## 2017-09-10 LAB — CBC WITH DIFFERENTIAL/PLATELET
Basophils Absolute: 0 10*3/uL (ref 0–0.1)
Basophils Relative: 0 %
EOS ABS: 0 10*3/uL (ref 0–0.7)
EOS PCT: 1 %
HCT: 24.4 % — ABNORMAL LOW (ref 40.0–52.0)
Hemoglobin: 7.9 g/dL — ABNORMAL LOW (ref 13.0–18.0)
LYMPHS ABS: 0.3 10*3/uL — AB (ref 1.0–3.6)
LYMPHS PCT: 8 %
MCH: 28.7 pg (ref 26.0–34.0)
MCHC: 32.2 g/dL (ref 32.0–36.0)
MCV: 89.1 fL (ref 80.0–100.0)
MONO ABS: 0.5 10*3/uL (ref 0.2–1.0)
MONOS PCT: 12 %
Neutro Abs: 3.5 10*3/uL (ref 1.4–6.5)
Neutrophils Relative %: 79 %
PLATELETS: 92 10*3/uL — AB (ref 150–440)
RBC: 2.74 MIL/uL — AB (ref 4.40–5.90)
RDW: 16 % — AB (ref 11.5–14.5)
WBC: 4.4 10*3/uL (ref 3.8–10.6)

## 2017-09-10 LAB — BASIC METABOLIC PANEL
Anion gap: 9 (ref 5–15)
BUN: 78 mg/dL — AB (ref 6–20)
CO2: 20 mmol/L — ABNORMAL LOW (ref 22–32)
CREATININE: 1.91 mg/dL — AB (ref 0.61–1.24)
Calcium: 8.6 mg/dL — ABNORMAL LOW (ref 8.9–10.3)
Chloride: 108 mmol/L (ref 101–111)
GFR calc Af Amer: 34 mL/min — ABNORMAL LOW (ref >60)
GFR, EST NON AFRICAN AMERICAN: 29 mL/min — AB (ref >60)
GLUCOSE: 173 mg/dL — AB (ref 65–99)
POTASSIUM: 4.4 mmol/L (ref 3.5–5.1)
SODIUM: 137 mmol/L (ref 135–145)

## 2017-09-10 LAB — DAT, POLYSPECIFIC AHG (ARMC ONLY): Polyspecific AHG test: NEGATIVE

## 2017-09-10 MED ORDER — TORSEMIDE 20 MG PO TABS: 20.0000 mg | ORAL_TABLET | Freq: Every day | ORAL | 0 refills | Status: DC

## 2017-09-10 NOTE — Telephone Encounter (Signed)
Agree with verbal orders. Let's stop lasix, start torsemide 20mg  once daily. Take for 3 days and then call with update on leg swelling, weight, effect. New medicine sent to pharmacy.  If not already involved, can we get skilled nursing to come to house to help with new medicine? plz touch base with pt's family as pt cannot hear.

## 2017-09-10 NOTE — Telephone Encounter (Signed)
Spoke with Charles Rodgers informing her Dr. Darnell Level agrees with verbal orders for PT.  Also, relayed message concerning meds and skilled nursing. Says she will contact the family about both.

## 2017-09-10 NOTE — Telephone Encounter (Signed)
Spoke with Anastasiya.

## 2017-09-10 NOTE — Telephone Encounter (Signed)
Dr Danise Mina, can you please put an order in for skilled nursing so I can get this set up for the patient and I will call the family about this too. Thank Edrick Kins, RMA

## 2017-09-13 DIAGNOSIS — I129 Hypertensive chronic kidney disease with stage 1 through stage 4 chronic kidney disease, or unspecified chronic kidney disease: Secondary | ICD-10-CM | POA: Diagnosis not present

## 2017-09-13 DIAGNOSIS — E1122 Type 2 diabetes mellitus with diabetic chronic kidney disease: Secondary | ICD-10-CM | POA: Diagnosis not present

## 2017-09-13 DIAGNOSIS — I82591 Chronic embolism and thrombosis of other specified deep vein of right lower extremity: Secondary | ICD-10-CM | POA: Diagnosis not present

## 2017-09-13 DIAGNOSIS — M1990 Unspecified osteoarthritis, unspecified site: Secondary | ICD-10-CM | POA: Diagnosis not present

## 2017-09-13 DIAGNOSIS — E785 Hyperlipidemia, unspecified: Secondary | ICD-10-CM | POA: Diagnosis not present

## 2017-09-13 DIAGNOSIS — Z7901 Long term (current) use of anticoagulants: Secondary | ICD-10-CM | POA: Diagnosis not present

## 2017-09-13 DIAGNOSIS — R531 Weakness: Secondary | ICD-10-CM | POA: Diagnosis not present

## 2017-09-13 DIAGNOSIS — J449 Chronic obstructive pulmonary disease, unspecified: Secondary | ICD-10-CM | POA: Diagnosis not present

## 2017-09-13 DIAGNOSIS — N183 Chronic kidney disease, stage 3 (moderate): Secondary | ICD-10-CM | POA: Diagnosis not present

## 2017-09-13 DIAGNOSIS — Z85828 Personal history of other malignant neoplasm of skin: Secondary | ICD-10-CM | POA: Diagnosis not present

## 2017-09-13 DIAGNOSIS — N400 Enlarged prostate without lower urinary tract symptoms: Secondary | ICD-10-CM | POA: Diagnosis not present

## 2017-09-13 DIAGNOSIS — Z9181 History of falling: Secondary | ICD-10-CM | POA: Diagnosis not present

## 2017-09-13 DIAGNOSIS — H409 Unspecified glaucoma: Secondary | ICD-10-CM | POA: Diagnosis not present

## 2017-09-13 DIAGNOSIS — Z87891 Personal history of nicotine dependence: Secondary | ICD-10-CM | POA: Diagnosis not present

## 2017-09-13 LAB — COLD AGGLUTININ TITER

## 2017-09-14 ENCOUNTER — Telehealth: Payer: Self-pay | Admitting: Family Medicine

## 2017-09-14 DIAGNOSIS — E1122 Type 2 diabetes mellitus with diabetic chronic kidney disease: Secondary | ICD-10-CM | POA: Diagnosis not present

## 2017-09-14 DIAGNOSIS — E785 Hyperlipidemia, unspecified: Secondary | ICD-10-CM | POA: Diagnosis not present

## 2017-09-14 DIAGNOSIS — I129 Hypertensive chronic kidney disease with stage 1 through stage 4 chronic kidney disease, or unspecified chronic kidney disease: Secondary | ICD-10-CM | POA: Diagnosis not present

## 2017-09-14 DIAGNOSIS — J449 Chronic obstructive pulmonary disease, unspecified: Secondary | ICD-10-CM | POA: Diagnosis not present

## 2017-09-14 DIAGNOSIS — R531 Weakness: Secondary | ICD-10-CM | POA: Diagnosis not present

## 2017-09-14 DIAGNOSIS — I82591 Chronic embolism and thrombosis of other specified deep vein of right lower extremity: Secondary | ICD-10-CM | POA: Diagnosis not present

## 2017-09-14 DIAGNOSIS — H409 Unspecified glaucoma: Secondary | ICD-10-CM | POA: Diagnosis not present

## 2017-09-14 DIAGNOSIS — Z9181 History of falling: Secondary | ICD-10-CM | POA: Diagnosis not present

## 2017-09-14 DIAGNOSIS — Z87891 Personal history of nicotine dependence: Secondary | ICD-10-CM | POA: Diagnosis not present

## 2017-09-14 DIAGNOSIS — M1990 Unspecified osteoarthritis, unspecified site: Secondary | ICD-10-CM | POA: Diagnosis not present

## 2017-09-14 DIAGNOSIS — N400 Enlarged prostate without lower urinary tract symptoms: Secondary | ICD-10-CM | POA: Diagnosis not present

## 2017-09-14 DIAGNOSIS — Z7901 Long term (current) use of anticoagulants: Secondary | ICD-10-CM | POA: Diagnosis not present

## 2017-09-14 DIAGNOSIS — Z85828 Personal history of other malignant neoplasm of skin: Secondary | ICD-10-CM | POA: Diagnosis not present

## 2017-09-14 DIAGNOSIS — N183 Chronic kidney disease, stage 3 (moderate): Secondary | ICD-10-CM | POA: Diagnosis not present

## 2017-09-14 NOTE — Telephone Encounter (Signed)
Copied from [place] [id number]. Topic: General - Other >> Sep 14, 2017  1:15 PM Carolyn Stare wrote:  Charles Rodgers with [facility] call to get verbal orders for plan of care ,  forsamide and there has been no change in his edema  , bilat lower extrem is red with heat in them and asking if DR G would like to start another antibiotic   Nurse plan of care 2 x 1  1 x 3  1 every other for 4    [phone]

## 2017-09-15 LAB — CRYOGLOBULIN

## 2017-09-15 MED ORDER — CEPHALEXIN 500 MG PO CAPS: 500.0000 mg | ORAL_CAPSULE | Freq: Two times a day (BID) | ORAL | 0 refills | Status: DC

## 2017-09-15 NOTE — Telephone Encounter (Signed)
How is urine output on torsemide 20mg  daily? I'd like them to start checking daily weights.  For possible cellulitis let's restart keflex 50mg  bid x7 days.  [place] for nurse plan of care.

## 2017-09-15 NOTE — Telephone Encounter (Signed)
Spoke with Davy Pique of [place] her Dr. Darnell Level is ok with the nurse plan of care.  She states, per the family, pt's urine output has not changed on med.  Says she will relay Dr. Darnell Level instructions and message about the wt checks and starting keflex.

## 2017-09-16 DIAGNOSIS — Z9181 History of falling: Secondary | ICD-10-CM | POA: Diagnosis not present

## 2017-09-16 DIAGNOSIS — R531 Weakness: Secondary | ICD-10-CM | POA: Diagnosis not present

## 2017-09-16 DIAGNOSIS — J449 Chronic obstructive pulmonary disease, unspecified: Secondary | ICD-10-CM | POA: Diagnosis not present

## 2017-09-16 DIAGNOSIS — N183 Chronic kidney disease, stage 3 (moderate): Secondary | ICD-10-CM | POA: Diagnosis not present

## 2017-09-16 DIAGNOSIS — E785 Hyperlipidemia, unspecified: Secondary | ICD-10-CM | POA: Diagnosis not present

## 2017-09-16 DIAGNOSIS — Z85828 Personal history of other malignant neoplasm of skin: Secondary | ICD-10-CM | POA: Diagnosis not present

## 2017-09-16 DIAGNOSIS — I82591 Chronic embolism and thrombosis of other specified deep vein of right lower extremity: Secondary | ICD-10-CM | POA: Diagnosis not present

## 2017-09-16 DIAGNOSIS — I129 Hypertensive chronic kidney disease with stage 1 through stage 4 chronic kidney disease, or unspecified chronic kidney disease: Secondary | ICD-10-CM | POA: Diagnosis not present

## 2017-09-16 DIAGNOSIS — H409 Unspecified glaucoma: Secondary | ICD-10-CM | POA: Diagnosis not present

## 2017-09-16 DIAGNOSIS — N400 Enlarged prostate without lower urinary tract symptoms: Secondary | ICD-10-CM | POA: Diagnosis not present

## 2017-09-16 DIAGNOSIS — E1122 Type 2 diabetes mellitus with diabetic chronic kidney disease: Secondary | ICD-10-CM | POA: Diagnosis not present

## 2017-09-16 DIAGNOSIS — M1990 Unspecified osteoarthritis, unspecified site: Secondary | ICD-10-CM | POA: Diagnosis not present

## 2017-09-16 DIAGNOSIS — Z87891 Personal history of nicotine dependence: Secondary | ICD-10-CM | POA: Diagnosis not present

## 2017-09-16 DIAGNOSIS — Z7901 Long term (current) use of anticoagulants: Secondary | ICD-10-CM | POA: Diagnosis not present

## 2017-09-17 ENCOUNTER — Ambulatory Visit: Payer: Medicare Other

## 2017-09-17 ENCOUNTER — Inpatient Hospital Stay: Payer: Medicare Other

## 2017-09-17 ENCOUNTER — Other Ambulatory Visit: Payer: Self-pay | Admitting: Urgent Care

## 2017-09-17 VITALS — BP 148/65 | HR 62

## 2017-09-17 DIAGNOSIS — D631 Anemia in chronic kidney disease: Secondary | ICD-10-CM

## 2017-09-17 DIAGNOSIS — N184 Chronic kidney disease, stage 4 (severe): Principal | ICD-10-CM

## 2017-09-17 DIAGNOSIS — I129 Hypertensive chronic kidney disease with stage 1 through stage 4 chronic kidney disease, or unspecified chronic kidney disease: Secondary | ICD-10-CM | POA: Diagnosis not present

## 2017-09-17 DIAGNOSIS — N189 Chronic kidney disease, unspecified: Secondary | ICD-10-CM

## 2017-09-17 DIAGNOSIS — N183 Chronic kidney disease, stage 3 (moderate): Secondary | ICD-10-CM | POA: Diagnosis not present

## 2017-09-17 LAB — SAMPLE TO BLOOD BANK

## 2017-09-17 LAB — HEMOGLOBIN: Hemoglobin: 7.5 g/dL — ABNORMAL LOW (ref 13.0–18.0)

## 2017-09-17 LAB — PREPARE RBC (CROSSMATCH)

## 2017-09-17 MED ORDER — EPOETIN ALFA 10000 UNIT/ML IJ SOLN
10000.0000 [IU] | Freq: Once | INTRAMUSCULAR | Status: AC
  Administered 2017-09-17: 10000 [IU] via SUBCUTANEOUS

## 2017-09-17 NOTE — Telephone Encounter (Signed)
Is torsemide effectively helping him go to the bathroom? If not may need increased dose. Check daily weights and keep log, update [place] next week.

## 2017-09-17 NOTE — Telephone Encounter (Signed)
Left message to call back.  Need to relay Dr. Synthia Innocent message.

## 2017-09-18 LAB — PREPARE RBC (CROSSMATCH)

## 2017-09-20 ENCOUNTER — Inpatient Hospital Stay: Payer: Medicare Other

## 2017-09-20 DIAGNOSIS — D631 Anemia in chronic kidney disease: Secondary | ICD-10-CM | POA: Diagnosis not present

## 2017-09-20 DIAGNOSIS — N189 Chronic kidney disease, unspecified: Principal | ICD-10-CM

## 2017-09-20 DIAGNOSIS — I129 Hypertensive chronic kidney disease with stage 1 through stage 4 chronic kidney disease, or unspecified chronic kidney disease: Secondary | ICD-10-CM | POA: Diagnosis not present

## 2017-09-20 DIAGNOSIS — N183 Chronic kidney disease, stage 3 (moderate): Secondary | ICD-10-CM | POA: Diagnosis not present

## 2017-09-20 LAB — CBC
HCT: 27.9 % — ABNORMAL LOW (ref 40.0–52.0)
HEMOGLOBIN: 9.1 g/dL — AB (ref 13.0–18.0)
MCH: 29 pg (ref 26.0–34.0)
MCHC: 32.5 g/dL (ref 32.0–36.0)
MCV: 89.3 fL (ref 80.0–100.0)
Platelets: 87 10*3/uL — ABNORMAL LOW (ref 150–440)
RBC: 3.13 MIL/uL — AB (ref 4.40–5.90)
RDW: 16.4 % — AB (ref 11.5–14.5)
WBC: 5.2 10*3/uL (ref 3.8–10.6)

## 2017-09-20 MED ORDER — DIPHENHYDRAMINE HCL 25 MG PO CAPS
25.0000 mg | ORAL_CAPSULE | Freq: Once | ORAL | Status: AC
  Administered 2017-09-20: 25 mg via ORAL
  Filled 2017-09-20: qty 1

## 2017-09-20 MED ORDER — ACETAMINOPHEN 325 MG PO TABS
650.0000 mg | ORAL_TABLET | Freq: Once | ORAL | Status: AC
  Administered 2017-09-20: 650 mg via ORAL
  Filled 2017-09-20: qty 2

## 2017-09-20 MED ORDER — SODIUM CHLORIDE 0.9 % IV SOLN
250.0000 mL | Freq: Once | INTRAVENOUS | Status: AC
  Administered 2017-09-20: 250 mL via INTRAVENOUS
  Filled 2017-09-20: qty 250

## 2017-09-20 NOTE — Progress Notes (Signed)
Post blood transfusion labs reviewed with Honor Loh NP. Per Gaspar Bidding NP okay to d/c pt home, NNO at this time.

## 2017-09-20 NOTE — Telephone Encounter (Signed)
Left message to call back.  Need to relay Dr. Synthia Innocent message.

## 2017-09-21 ENCOUNTER — Ambulatory Visit: Payer: Self-pay

## 2017-09-21 LAB — TYPE AND SCREEN
ABO/RH(D): A POS
Antibody Screen: POSITIVE
UNIT DIVISION: 0
Unit division: 0

## 2017-09-21 LAB — BPAM RBC
BLOOD PRODUCT EXPIRATION DATE: 201906252359
Blood Product Expiration Date: 201907112359
ISSUE DATE / TIME: 201906241209
Unit Type and Rh: 600
Unit Type and Rh: 6200

## 2017-09-21 NOTE — Telephone Encounter (Signed)
Spoke with [employer] and instructions per Dr. Darnell Level.  She verbalizes understanding.

## 2017-09-21 NOTE — Telephone Encounter (Addendum)
Spoke with pt's niece, [name] (on dpr), asking for update on pt's urine output.  States it is no better.  Also, discussed them getting a daily wt on pt.  [name] says the [facility] nurse mentioned that to them [the family] and said it should be done every morning. So [name] states they have not been getting a wt because the pt lives alone and would need help doing this and they do not get to his home until about 2:00 PM.  [name] gives permission to lvm at [phone] with any response from Dr. Darnell Level.

## 2017-09-21 NOTE — Telephone Encounter (Signed)
Ok to check daily weights when they arrive to help him at 2pm. As long as checking about same time each day should be ok.  Try 2 torsemide daily for next 2 days to see if any improvement in urine output and update [place] on Thursday.   Lab Results  Component Value Date   CREATININE 1.91 (H) 09/10/2017   BUN 78 (H) 09/10/2017   NA 137 09/10/2017   K 4.4 09/10/2017   CL 108 09/10/2017   CO2 20 (L) 09/10/2017

## 2017-09-21 NOTE — Telephone Encounter (Signed)
Today we should have increased torsemide to 2 tablets daily for 2 days - then they are to call us Thursday with an update on swelling and weights.  For blood in underwear - if pt willing would have him come in for evaluation of this.

## 2017-09-21 NOTE — Telephone Encounter (Signed)
Pt.'s brother reports pt. Has had increased swelling to his thighs and scrotum. States "He won't really let us look at his scrotum - we noticed blood on his underwear." "Taking his fluid pills like he's supposed to." Had blood transfusion yesterday. Pt. Has an appointment Friday. Want to know what doctor thinks.

## 2017-09-21 NOTE — Telephone Encounter (Signed)
Please see 09/14/17 phone note.

## 2017-09-22 DIAGNOSIS — N400 Enlarged prostate without lower urinary tract symptoms: Secondary | ICD-10-CM | POA: Diagnosis not present

## 2017-09-22 DIAGNOSIS — I129 Hypertensive chronic kidney disease with stage 1 through stage 4 chronic kidney disease, or unspecified chronic kidney disease: Secondary | ICD-10-CM | POA: Diagnosis not present

## 2017-09-22 DIAGNOSIS — J449 Chronic obstructive pulmonary disease, unspecified: Secondary | ICD-10-CM | POA: Diagnosis not present

## 2017-09-22 DIAGNOSIS — I82591 Chronic embolism and thrombosis of other specified deep vein of right lower extremity: Secondary | ICD-10-CM | POA: Diagnosis not present

## 2017-09-22 DIAGNOSIS — H409 Unspecified glaucoma: Secondary | ICD-10-CM | POA: Diagnosis not present

## 2017-09-22 DIAGNOSIS — E785 Hyperlipidemia, unspecified: Secondary | ICD-10-CM | POA: Diagnosis not present

## 2017-09-22 DIAGNOSIS — Z85828 Personal history of other malignant neoplasm of skin: Secondary | ICD-10-CM | POA: Diagnosis not present

## 2017-09-22 DIAGNOSIS — M1990 Unspecified osteoarthritis, unspecified site: Secondary | ICD-10-CM | POA: Diagnosis not present

## 2017-09-22 DIAGNOSIS — R531 Weakness: Secondary | ICD-10-CM | POA: Diagnosis not present

## 2017-09-22 DIAGNOSIS — E1122 Type 2 diabetes mellitus with diabetic chronic kidney disease: Secondary | ICD-10-CM | POA: Diagnosis not present

## 2017-09-22 DIAGNOSIS — N183 Chronic kidney disease, stage 3 (moderate): Secondary | ICD-10-CM | POA: Diagnosis not present

## 2017-09-22 DIAGNOSIS — Z7901 Long term (current) use of anticoagulants: Secondary | ICD-10-CM | POA: Diagnosis not present

## 2017-09-22 DIAGNOSIS — Z87891 Personal history of nicotine dependence: Secondary | ICD-10-CM | POA: Diagnosis not present

## 2017-09-22 DIAGNOSIS — Z9181 History of falling: Secondary | ICD-10-CM | POA: Diagnosis not present

## 2017-09-22 NOTE — Telephone Encounter (Signed)
Spoke pt's niece Debbie relaying Dr. Synthia Innocent message.  Verbalizes understanding. Says the blood in the underwear was found in one pair in the laundry so it is not recent and not sure what it is from.  Says right now pt wants to wait at least until tomorrow to see if taking the extra meds will help with the swelling in scrotum area. Per pt, area is not painful.

## 2017-09-23 ENCOUNTER — Encounter: Payer: Self-pay | Admitting: Family Medicine

## 2017-09-23 DIAGNOSIS — R531 Weakness: Secondary | ICD-10-CM | POA: Diagnosis not present

## 2017-09-23 DIAGNOSIS — N183 Chronic kidney disease, stage 3 (moderate): Secondary | ICD-10-CM | POA: Diagnosis not present

## 2017-09-23 DIAGNOSIS — I82591 Chronic embolism and thrombosis of other specified deep vein of right lower extremity: Secondary | ICD-10-CM | POA: Diagnosis not present

## 2017-09-23 DIAGNOSIS — E785 Hyperlipidemia, unspecified: Secondary | ICD-10-CM | POA: Diagnosis not present

## 2017-09-23 DIAGNOSIS — H409 Unspecified glaucoma: Secondary | ICD-10-CM | POA: Diagnosis not present

## 2017-09-23 DIAGNOSIS — J449 Chronic obstructive pulmonary disease, unspecified: Secondary | ICD-10-CM | POA: Diagnosis not present

## 2017-09-23 DIAGNOSIS — Z7901 Long term (current) use of anticoagulants: Secondary | ICD-10-CM | POA: Diagnosis not present

## 2017-09-23 DIAGNOSIS — N400 Enlarged prostate without lower urinary tract symptoms: Secondary | ICD-10-CM | POA: Diagnosis not present

## 2017-09-23 DIAGNOSIS — Z85828 Personal history of other malignant neoplasm of skin: Secondary | ICD-10-CM | POA: Diagnosis not present

## 2017-09-23 DIAGNOSIS — Z87891 Personal history of nicotine dependence: Secondary | ICD-10-CM | POA: Diagnosis not present

## 2017-09-23 DIAGNOSIS — M1990 Unspecified osteoarthritis, unspecified site: Secondary | ICD-10-CM | POA: Diagnosis not present

## 2017-09-23 DIAGNOSIS — E1122 Type 2 diabetes mellitus with diabetic chronic kidney disease: Secondary | ICD-10-CM | POA: Diagnosis not present

## 2017-09-23 DIAGNOSIS — Z9181 History of falling: Secondary | ICD-10-CM | POA: Diagnosis not present

## 2017-09-23 DIAGNOSIS — I129 Hypertensive chronic kidney disease with stage 1 through stage 4 chronic kidney disease, or unspecified chronic kidney disease: Secondary | ICD-10-CM | POA: Diagnosis not present

## 2017-09-23 NOTE — Telephone Encounter (Signed)
Spoke with Sonja form [employer]. She stated that the pt's weight is up to 198 lbs (was 189 on 08/31/17). His scrotum is the size of a grapefruit. Becky Sax noted he looked worse today. She stated he is not elevating his legs and has 4+ edema to lower extremities and that his face looks "sunken." Becky Sax stated that he is staring out into space. Pt  Is edematous from the waist down. Sonja noted that the pt is sitting outside in the shade in this hot weather. Skyped Rena at Dr Gutierrez's office to update. Per Mearl Latin, asked to have pt keep his appointment for tomorrow but if he worsens to go to the ED. Called pt's niece [name] [phone]) and informed her of message from Dr Danise Mina.

## 2017-09-23 NOTE — Telephone Encounter (Signed)
Pt already had CPX scheduled on 09/24/17 at 11:30.

## 2017-09-23 NOTE — Telephone Encounter (Signed)
This encounter was created in error - please disregard.

## 2017-09-24 ENCOUNTER — Encounter: Payer: Self-pay | Admitting: Family Medicine

## 2017-09-24 ENCOUNTER — Ambulatory Visit (INDEPENDENT_AMBULATORY_CARE_PROVIDER_SITE_OTHER): Payer: Medicare Other | Admitting: Family Medicine

## 2017-09-24 ENCOUNTER — Telehealth: Payer: Self-pay | Admitting: Family Medicine

## 2017-09-24 VITALS — BP 140/70 | HR 69 | Temp 97.7°F | Ht 66.25 in | Wt 201.8 lb

## 2017-09-24 DIAGNOSIS — I771 Stricture of artery: Secondary | ICD-10-CM | POA: Diagnosis not present

## 2017-09-24 DIAGNOSIS — H9193 Unspecified hearing loss, bilateral: Secondary | ICD-10-CM

## 2017-09-24 DIAGNOSIS — N2581 Secondary hyperparathyroidism of renal origin: Secondary | ICD-10-CM

## 2017-09-24 DIAGNOSIS — E1122 Type 2 diabetes mellitus with diabetic chronic kidney disease: Secondary | ICD-10-CM

## 2017-09-24 DIAGNOSIS — D696 Thrombocytopenia, unspecified: Secondary | ICD-10-CM

## 2017-09-24 DIAGNOSIS — D4621 Refractory anemia with excess of blasts 1: Secondary | ICD-10-CM

## 2017-09-24 DIAGNOSIS — E1121 Type 2 diabetes mellitus with diabetic nephropathy: Secondary | ICD-10-CM | POA: Diagnosis not present

## 2017-09-24 DIAGNOSIS — R600 Localized edema: Secondary | ICD-10-CM

## 2017-09-24 DIAGNOSIS — D472 Monoclonal gammopathy: Secondary | ICD-10-CM

## 2017-09-24 DIAGNOSIS — I420 Dilated cardiomyopathy: Secondary | ICD-10-CM | POA: Diagnosis not present

## 2017-09-24 DIAGNOSIS — Z7189 Other specified counseling: Secondary | ICD-10-CM

## 2017-09-24 DIAGNOSIS — Z0000 Encounter for general adult medical examination without abnormal findings: Secondary | ICD-10-CM

## 2017-09-24 DIAGNOSIS — R627 Adult failure to thrive: Secondary | ICD-10-CM | POA: Insufficient documentation

## 2017-09-24 DIAGNOSIS — D631 Anemia in chronic kidney disease: Secondary | ICD-10-CM

## 2017-09-24 DIAGNOSIS — I4891 Unspecified atrial fibrillation: Secondary | ICD-10-CM | POA: Diagnosis not present

## 2017-09-24 DIAGNOSIS — I272 Pulmonary hypertension, unspecified: Secondary | ICD-10-CM

## 2017-09-24 DIAGNOSIS — N189 Chronic kidney disease, unspecified: Secondary | ICD-10-CM | POA: Diagnosis not present

## 2017-09-24 DIAGNOSIS — N184 Chronic kidney disease, stage 4 (severe): Secondary | ICD-10-CM | POA: Diagnosis not present

## 2017-09-24 LAB — RENAL FUNCTION PANEL
ALBUMIN: 3.5 g/dL (ref 3.5–5.2)
BUN: 87 mg/dL (ref 6–23)
CALCIUM: 8.8 mg/dL (ref 8.4–10.5)
CO2: 23 mEq/L (ref 19–32)
CREATININE: 2.28 mg/dL — AB (ref 0.40–1.50)
Chloride: 105 mEq/L (ref 96–112)
GFR: 28.76 mL/min — ABNORMAL LOW (ref >60.00)
GLUCOSE: 133 mg/dL — AB (ref 70–99)
Phosphorus: 4.7 mg/dL — ABNORMAL HIGH (ref 2.3–4.6)
Potassium: 5 mEq/L (ref 3.5–5.1)
Sodium: 138 mEq/L (ref 135–145)

## 2017-09-24 LAB — TSH: TSH: 2.43 u[IU]/mL (ref 0.35–4.50)

## 2017-09-24 MED ORDER — FUROSEMIDE 40 MG PO TABS: 40.0000 mg | ORAL_TABLET | Freq: Two times a day (BID) | ORAL | 1 refills | Status: AC

## 2017-09-24 MED ORDER — METOLAZONE 2.5 MG PO TABS: 2.5000 mg | ORAL_TABLET | Freq: Every day | ORAL | 0 refills | Status: AC | PRN

## 2017-09-24 NOTE — Progress Notes (Signed)
BP 140/70 (BP Location: Left Arm, Patient Position: Sitting, Cuff Size: Normal)   Pulse 69   Temp 97.7 F (36.5 C) (Oral)   Ht 5' 6.25" (1.683 m)   Wt 201 lb 12 oz (91.5 kg)   SpO2 95%   BMI 32.32 kg/m    CC: AMW  Subjective:    Patient ID: Charles Rodgers, male    DOB: [birth year], 84 y.o.   MRN: [id number]  HPI: Charles Rodgers is a 84 y.o. male presenting on 09/24/2017 for Medicare Wellness (Per pt's niece, pt's uring output is not any better, possibly less after increasing torsemide.  And pt still has bilateral LE swelling. Pt accompanied by niece, Debbie and sister-in-law, Narda Rutherford. ) and Groin Swelling (C/o swelling in scrotum. Family was made aware 09/21/17. )   History limited due to hard of hearing. Most history obtained from sister in law [place] and niece [name].   Hearing Screening Comments: Pt wears hearing aid in one ear but still has difficulty hearing. Vision Screening Comments: Last eye exam within the yr  High fall risk Depression - PHQ9 = 10. Likely ongoing trouble due to functional limitations.  Enjoys sitting outdoors - actually stays seated outdoors most of the day. Widower, lives alone. Family come visit but he doesn't let them help much.   Increased fatigue, increased swelling in legs and into groin. Had blood transfusion on Monday.   Preventative: Colon cancer screening - aged out. Denies bm changes or blood in stool. Prostate cancer screening - discussed, will stop. Stopped seeing urologist (Dr Bernardo Heater). Prior followed for BPH.  Flu shot yearly  Td 12/2011 Pneumovax 09/2011, prevnar 2013-07-08 Shingles shot - declines.  Advanced directives - has not set up yet. Would want brother [name] to help make medical Declines prolonged life support. Wouldn't want to be kept alive if <50% chance would survive. Packet provided previously.  Seat belt use discussed Sunscreen use discussed Non smoker Alcohol - none  Widower - wife passed away 07/09/15.  Lives  alone (grown children) Daughter and grandson live nearby  Brother [name]), sister in law Narda Rutherford) and niece [name]) are acting as main caregivers.  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 Interim medical history since our last visit reviewed. Allergies and medications reviewed and updated. Outpatient Medications Prior to Visit  Medication Sig Dispense Refill  . amLODipine (NORVASC) 10 MG tablet Take 1 tablet (10 mg total) by mouth daily. 90 tablet 3  . apixaban (ELIQUIS) 2.5 MG TABS tablet Take 1 tablet (2.5 mg total) by mouth 2 (two) times daily. 60 tablet 6  . Azelastine HCl 137 MCG/SPRAY SOLN Place 1-2 sprays into both nostrils at bedtime.     . dorzolamide-timolol (COSOPT) 22.3-6.8 MG/ML ophthalmic solution Place 1 drop into both eyes 2 (two) times daily. 10 mL   . doxazosin (CARDURA) 8 MG tablet TAKE 1 TABLET BY MOUTH AT BEDTIME 90 tablet 1  . ferrous sulfate 325 (65 FE) MG tablet TAKE 1 TABLET BY MOUTH ONCE A DAY WITH BREAKFAST (Patient taking differently: TAKE 2 TABLETS BY MOUTH ONCE A DAY WITH BREAKFAST) 90 tablet 0  . fluticasone (FLONASE) 50 MCG/ACT nasal spray Place 2 sprays into both nostrils daily. 16 g 3  . latanoprost (XALATAN) 0.005 % ophthalmic solution Place 1 drop into both eyes at bedtime. 2.5 mL   . lisinopril (  PRINIVIL,ZESTRIL) 20 MG tablet Take 1 tablet (20 mg total) by mouth 2 (two) times daily. 180 tablet 1  . ondansetron (ZOFRAN) 4 MG tablet Take 1 tablet (4 mg total) by mouth every 6 (six) hours as needed for nausea. 20 tablet 0  . pravastatin (PRAVACHOL) 40 MG tablet TAKE 1 TABLET BY MOUTH AT BEDTIME 90 tablet 1  . psyllium (METAMUCIL) 58.6 % packet Take 1 packet by mouth at bedtime.    . triamcinolone cream (KENALOG) 0.1 % Apply 1 application topically 2 (two) times  daily. 454 g 0  . spironolactone (ALDACTONE) 25 MG tablet Take 0.5 tablets (12.5 mg total) by mouth every other day. 15 tablet 3  . torsemide (DEMADEX) 20 MG tablet Take 1 tablet (20 mg total) by mouth daily. (Patient taking differently: Take 20 mg by mouth daily. Taking 2 tablets 2 times a day) 30 tablet 0  . cephALEXin (KEFLEX) 500 MG capsule Take 1 capsule (500 mg total) by mouth 2 (two) times daily. 14 capsule 0  . Cholecalciferol (VITAMIN D) 2000 units CAPS Take 1 capsule (2,000 Units total) by mouth daily. 30 capsule   . glucose blood (ONE TOUCH ULTRA TEST) test strip USE TO CHECK BLOOD SUGAR 3 TIMES DAILY Dx: E11.42 300 each 2  . loratadine (CLARITIN) 10 MG tablet Take 1 tablet (10 mg total) by mouth daily. 30 tablet 3   No facility-administered medications prior to visit.      Per HPI unless specifically indicated in ROS section below Review of Systems     Objective:    BP 140/70 (BP Location: Left Arm, Patient Position: Sitting, Cuff Size: Normal)   Pulse 69   Temp 97.7 F (36.5 C) (Oral)   Ht 5' 6.25" (1.683 m)   Wt 201 lb 12 oz (91.5 kg)   SpO2 95%   BMI 32.32 kg/m   Wt Readings from Last 3 Encounters:  09/24/17 201 lb 12 oz (91.5 kg)  08/31/17 189 lb (85.7 kg)  08/26/17 187 lb 9.8 oz (85.1 kg)    Physical Exam  Constitutional: He appears well-developed and well-nourished. No distress.  HENT:  Right Ear: Decreased hearing is noted.  Left Ear: Decreased hearing is noted.  SEVERELY hard of hearing Wears hearing aid L ear canal, not effective  Cardiovascular: Normal rate, regular rhythm and normal heart sounds.  No murmur heard. Pulmonary/Chest: Effort normal and breath sounds normal. No respiratory distress. He has no wheezes. He has no rales.  Bibasilar crackles  Abdominal: Hernia confirmed negative in the right inguinal area and confirmed negative in the left inguinal area.  Genitourinary: Right testis shows no mass. Left testis shows no mass.  Genitourinary  Comments: Severe scrotal swelling without mass  Musculoskeletal: He exhibits edema (3+ pitting bilaterally through groin into scrotum).  Severe edema into scrotum R lateral ankle with scabbed erosion No significant erythema  Nursing note and vitals reviewed.  Results for orders placed or performed in visit on 09/24/17  Renal function panel  Result Value Ref Range   Sodium 138 135 - 145 mEq/L   Potassium 5.0 3.5 - 5.1 mEq/L   Chloride 105 96 - 112 mEq/L   CO2 23 19 - 32 mEq/L   Calcium 8.8 8.4 - 10.5 mg/dL   Albumin 3.5 3.5 - 5.2 g/dL   BUN 87 (HH) 6 - 23 mg/dL   Creatinine, Ser 2.28 (H) 0.40 - 1.50 mg/dL   Glucose, Bld 133 (H) 70 - 99 mg/dL   Phosphorus 4.7 (H)  2.3 - 4.6 mg/dL   GFR 28.76 (L) >60.00 mL/min  TSH  Result Value Ref Range   TSH 2.43 0.35 - 4.50 uIU/mL   Lab Results  Component Value Date   WBC 5.2 09/20/2017   HGB 8.8 (L) 10/01/2017   HCT 27.9 (L) 09/20/2017   MCV 89.3 09/20/2017   PLT 87 (L) 09/20/2017    Lab Results  Component Value Date   HGBA1C 6.3 03/25/2017       Assessment & Plan:   Problem List Items Addressed This Visit    Thrombocytopenia (HCC)    Chronic, sees heme. ?MDS. Receives procrit, has received blood transfusions.       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       Relevant Medications   furosemide (LASIX) 40 MG tablet   metolazone (ZAROXOLYN) 2.5 MG tablet   Pedal edema    Main concern today - with edema extending to groin. Prior on lasix 40mg  bid. Marked deterioration on torsemide - will transition back to lasix 40mg  BID and add metolazone 2.5mg  twice weekly, discontinue spironolactone. Close f/u  over the next few days with effect.       Relevant Orders   Renal function panel (Completed)   TSH (Completed)   Amb Referral to Palliative Care   Monoclonal gammopathy of unknown significance ([facility])   Medicare annual wellness visit, subsequent - Primary    I have personally reviewed the Medicare Annual Wellness questionnaire and have noted 1. The patient's medical and social history 2. Their use of alcohol, tobacco or illicit drugs 3. Their current medications and supplements 4. The patient's functional ability including ADL's, fall risks, home safety risks and hearing or visual impairment. Cognitive function has been assessed and addressed as indicated.  5. Diet and physical activity 6. Evidence for depression or mood disorders The patients weight, height, BMI have been recorded in the chart. I have made referrals, counseling and provided education to the patient based on review of the above and I have provided the pt with a written personalized care plan for preventive services. Provider list updated.. See scanned questionairre as needed for further documentation. Reviewed preventative protocols and updated unless pt declined.       HOH (hard of hearing)    Poor vision and hearing complicate care.       Goals of care, counseling/discussion    HH currently involved - PT and SN, pt declined nurse aide. Continues struggling at home. Family agree with palliative care evaluation at home - referral placed today. I previously recommended med alert button.       Dilated cardiomyopathy (HCC)    Elevated PA pressures. Previously declined further evaluation. Return to lasix, add metolazone.  Will again review cards referral.  Trouble with weights due to unsteadiness when standing on scale - I asked them to look into larger scale.       Relevant Medications   furosemide (LASIX) 40 MG tablet   metolazone (ZAROXOLYN) 2.5 MG tablet   Other Relevant Orders   Amb Referral to Palliative  Care   Controlled type 2 diabetes mellitus with diabetic nephropathy,  without long-term current use of insulin (HCC)    Stable period off antihyperglycemics      CKD stage 4 due to type 2 diabetes mellitus ([place])    Complicates diuresis - will need to closely monitor.       Relevant Orders   Renal function panel (Completed)   TSH (Completed)   Amb Referral to Palliative Care   Atrial fibrillation with slow ventricular response (HCC)    Continue low dose eliquis (started 2017).       Relevant Medications   furosemide (LASIX) 40 MG tablet   metolazone (ZAROXOLYN) 2.5 MG tablet   Anemia in chronic kidney disease    Marked despite procrit - ?bone marrow dysplasia. Continue heme f/u.      Relevant Orders   Amb Referral to Palliative Care   Advanced care planning/counseling discussion    Advanced directives - has not set up yet. Would want brother [name] to help make medical Declines prolonged life support. Wouldn't want to be kept alive if <50% chance would survive. Packet provided previously.           Meds ordered this encounter  Medications  . furosemide (LASIX) 40 MG tablet    Sig: Take 1 tablet (40 mg total) by mouth 2 (two) times daily.    Dispense:  180 tablet    Refill:  1    In place of torsemide  . metolazone (ZAROXOLYN) 2.5 MG tablet    Sig: Take 1 tablet (2.5 mg total) by mouth daily as needed (leg swelling). Take today, then again on Monday.    Dispense:  30 tablet    Refill:  0   Orders Placed This Encounter  Procedures  . Renal function panel  . TSH  . Amb Referral to Palliative Care    Referral Priority:   Routine    Referral Type:   Consultation    Number of Visits Requested:   1    Follow up plan: No follow-ups on file.  [name], MD

## 2017-09-24 NOTE — Telephone Encounter (Signed)
[facility] called with critical results. BUN 87 Results given to Dr.Gutierrez at 3:30pm. Lendon Collar, RTR

## 2017-09-24 NOTE — Patient Instructions (Addendum)
Change back to lasix 40mg  2 in the morning and 1 at night for next 3 days, then return to prior dose of 40mg  twice daily. Call me Monday  Stop spironolactone. Start new medicine zaroxolyn 1 tablet today - and again on Monday.  Labs today.  I will refer you back to Dr Juleen China.  I would like to ask pallitative care docs to come out to the house for evaluation.  Check weight daily - ask about larger scale.

## 2017-09-24 NOTE — Telephone Encounter (Signed)
Noted  

## 2017-09-26 ENCOUNTER — Encounter: Payer: Self-pay | Admitting: Family Medicine

## 2017-09-27 ENCOUNTER — Telehealth: Payer: Self-pay | Admitting: Family Medicine

## 2017-09-27 NOTE — Telephone Encounter (Signed)
Copied from [place] [id number]. Topic: Quick Communication - See Telephone Encounter >> Sep 27, 2017  1:35 PM Percell Belt A wrote: CRM for notification. See Telephone encounter for: 09/27/17.  Debbie called in and would like to let Dr Darnell Level know that the meds is helping, he is less swollen and going to the restroom a little bit more then he had been.

## 2017-09-28 NOTE — Telephone Encounter (Signed)
Continue lasix 40mg  twice daily, take metolazone twice weekly. Update [place] over next 2-3 days with how he's doing.

## 2017-09-28 NOTE — Telephone Encounter (Signed)
Spoke with pt's niece, [name] (on dpr), relaying instructions per Dr. Henriette Combs understanding.

## 2017-09-29 DIAGNOSIS — Z85828 Personal history of other malignant neoplasm of skin: Secondary | ICD-10-CM

## 2017-09-29 DIAGNOSIS — N183 Chronic kidney disease, stage 3 (moderate): Secondary | ICD-10-CM

## 2017-09-29 DIAGNOSIS — E785 Hyperlipidemia, unspecified: Secondary | ICD-10-CM

## 2017-09-29 DIAGNOSIS — J449 Chronic obstructive pulmonary disease, unspecified: Secondary | ICD-10-CM | POA: Diagnosis not present

## 2017-09-29 DIAGNOSIS — N400 Enlarged prostate without lower urinary tract symptoms: Secondary | ICD-10-CM

## 2017-09-29 DIAGNOSIS — I129 Hypertensive chronic kidney disease with stage 1 through stage 4 chronic kidney disease, or unspecified chronic kidney disease: Secondary | ICD-10-CM | POA: Diagnosis not present

## 2017-09-29 DIAGNOSIS — M1990 Unspecified osteoarthritis, unspecified site: Secondary | ICD-10-CM | POA: Diagnosis not present

## 2017-09-29 DIAGNOSIS — H409 Unspecified glaucoma: Secondary | ICD-10-CM

## 2017-09-29 DIAGNOSIS — E1122 Type 2 diabetes mellitus with diabetic chronic kidney disease: Secondary | ICD-10-CM

## 2017-09-29 DIAGNOSIS — R531 Weakness: Secondary | ICD-10-CM | POA: Diagnosis not present

## 2017-09-29 DIAGNOSIS — Z7901 Long term (current) use of anticoagulants: Secondary | ICD-10-CM | POA: Diagnosis not present

## 2017-09-29 DIAGNOSIS — I82591 Chronic embolism and thrombosis of other specified deep vein of right lower extremity: Secondary | ICD-10-CM | POA: Diagnosis not present

## 2017-09-29 DIAGNOSIS — Z87891 Personal history of nicotine dependence: Secondary | ICD-10-CM | POA: Diagnosis not present

## 2017-09-29 DIAGNOSIS — Z9181 History of falling: Secondary | ICD-10-CM

## 2017-09-30 DIAGNOSIS — E785 Hyperlipidemia, unspecified: Secondary | ICD-10-CM | POA: Diagnosis not present

## 2017-09-30 DIAGNOSIS — N183 Chronic kidney disease, stage 3 (moderate): Secondary | ICD-10-CM | POA: Diagnosis not present

## 2017-09-30 DIAGNOSIS — Z7901 Long term (current) use of anticoagulants: Secondary | ICD-10-CM | POA: Diagnosis not present

## 2017-09-30 DIAGNOSIS — Z85828 Personal history of other malignant neoplasm of skin: Secondary | ICD-10-CM | POA: Diagnosis not present

## 2017-09-30 DIAGNOSIS — R531 Weakness: Secondary | ICD-10-CM | POA: Diagnosis not present

## 2017-09-30 DIAGNOSIS — H409 Unspecified glaucoma: Secondary | ICD-10-CM | POA: Diagnosis not present

## 2017-09-30 DIAGNOSIS — N400 Enlarged prostate without lower urinary tract symptoms: Secondary | ICD-10-CM | POA: Diagnosis not present

## 2017-09-30 DIAGNOSIS — I129 Hypertensive chronic kidney disease with stage 1 through stage 4 chronic kidney disease, or unspecified chronic kidney disease: Secondary | ICD-10-CM | POA: Diagnosis not present

## 2017-09-30 DIAGNOSIS — I82591 Chronic embolism and thrombosis of other specified deep vein of right lower extremity: Secondary | ICD-10-CM | POA: Diagnosis not present

## 2017-09-30 DIAGNOSIS — E1122 Type 2 diabetes mellitus with diabetic chronic kidney disease: Secondary | ICD-10-CM | POA: Diagnosis not present

## 2017-09-30 DIAGNOSIS — Z9181 History of falling: Secondary | ICD-10-CM | POA: Diagnosis not present

## 2017-09-30 DIAGNOSIS — M1990 Unspecified osteoarthritis, unspecified site: Secondary | ICD-10-CM | POA: Diagnosis not present

## 2017-09-30 DIAGNOSIS — J449 Chronic obstructive pulmonary disease, unspecified: Secondary | ICD-10-CM | POA: Diagnosis not present

## 2017-09-30 DIAGNOSIS — Z87891 Personal history of nicotine dependence: Secondary | ICD-10-CM | POA: Diagnosis not present

## 2017-10-01 ENCOUNTER — Inpatient Hospital Stay: Payer: Medicare Other

## 2017-10-01 ENCOUNTER — Telehealth: Payer: Self-pay | Admitting: Family Medicine

## 2017-10-01 ENCOUNTER — Inpatient Hospital Stay: Payer: Medicare Other | Attending: Hematology and Oncology

## 2017-10-01 VITALS — BP 154/69 | HR 60

## 2017-10-01 DIAGNOSIS — D631 Anemia in chronic kidney disease: Secondary | ICD-10-CM | POA: Diagnosis not present

## 2017-10-01 DIAGNOSIS — N183 Chronic kidney disease, stage 3 (moderate): Secondary | ICD-10-CM | POA: Insufficient documentation

## 2017-10-01 DIAGNOSIS — I129 Hypertensive chronic kidney disease with stage 1 through stage 4 chronic kidney disease, or unspecified chronic kidney disease: Secondary | ICD-10-CM | POA: Diagnosis not present

## 2017-10-01 DIAGNOSIS — N184 Chronic kidney disease, stage 4 (severe): Secondary | ICD-10-CM

## 2017-10-01 LAB — HEMOGLOBIN: Hemoglobin: 8.8 g/dL — ABNORMAL LOW (ref 13.0–18.0)

## 2017-10-01 MED ORDER — EPOETIN ALFA 10000 UNIT/ML IJ SOLN
10000.0000 [IU] | Freq: Once | INTRAMUSCULAR | Status: AC
  Administered 2017-10-01: 10000 [IU] via SUBCUTANEOUS

## 2017-10-01 NOTE — Telephone Encounter (Signed)
Recommend continue current treatment of lasix twice daily, and take metolazone (2nd water pill) twice weekly - should have taken one on Monday, did he take a second dose this week?

## 2017-10-01 NOTE — Telephone Encounter (Signed)
Copied from [place] [id number]. Topic: Quick Communication - See Telephone Encounter >> Oct 01, 2017  1:50 PM Burchel, Abbi R wrote: See Telephone encounter for: 10/01/17.  Pt's brother states that the swelling around ankles is a little better, but the swelling his groin area is worsening.  Should they continue with the Lasix or does Dr Darnell Level have other recommendations.  Pt's Brother [name]) [phone]

## 2017-10-01 NOTE — Telephone Encounter (Signed)
Spoke with pt's brother, [name] (on dpr), relaying instructions per Dr. Henriette Combs understanding.

## 2017-10-02 ENCOUNTER — Encounter: Payer: Self-pay | Admitting: Family Medicine

## 2017-10-02 DIAGNOSIS — I272 Pulmonary hypertension, unspecified: Secondary | ICD-10-CM | POA: Insufficient documentation

## 2017-10-02 DIAGNOSIS — D4621 Refractory anemia with excess of blasts 1: Secondary | ICD-10-CM | POA: Insufficient documentation

## 2017-10-02 DIAGNOSIS — N2581 Secondary hyperparathyroidism of renal origin: Secondary | ICD-10-CM | POA: Insufficient documentation

## 2017-10-02 NOTE — Assessment & Plan Note (Signed)
Marked despite procrit - ?bone marrow dysplasia. Continue heme f/u.

## 2017-10-02 NOTE — Assessment & Plan Note (Signed)

## 2017-10-02 NOTE — Assessment & Plan Note (Signed)
Stable period off antihyperglycemics

## 2017-10-02 NOTE — Assessment & Plan Note (Signed)
Advanced directives - has not set up yet. Would want brother [name] to help make medical Declines prolonged life support. Wouldn't want to be kept alive if <50% chance would survive. Packet provided previously.

## 2017-10-02 NOTE — Assessment & Plan Note (Signed)
Poor vision and hearing complicate care.

## 2017-10-02 NOTE — Assessment & Plan Note (Signed)
By echo - PA pressures peaked at >60 mmHg.

## 2017-10-02 NOTE — Assessment & Plan Note (Addendum)
Chronic, sees heme. ?MDS. Receives procrit, has received blood transfusions.

## 2017-10-02 NOTE — Assessment & Plan Note (Addendum)
Main concern today - with edema extending to groin. Prior on lasix 40mg  bid. Marked deterioration on torsemide - will transition back to lasix 40mg  BID and add metolazone 2.5mg  twice weekly, discontinue spironolactone. Close f/u over the next few days with effect.

## 2017-10-02 NOTE — Assessment & Plan Note (Signed)
Declined further imaging last discussion (2017)

## 2017-10-02 NOTE — Assessment & Plan Note (Signed)
Continue low dose eliquis (started 2017).

## 2017-10-02 NOTE — Assessment & Plan Note (Signed)
HH currently involved - PT and SN, pt declined nurse aide. Continues struggling at home. Family agree with palliative care evaluation at home - referral placed today. I previously recommended med alert button.

## 2017-10-02 NOTE — Assessment & Plan Note (Signed)
Complicates diuresis - will need to closely monitor.

## 2017-10-02 NOTE — Assessment & Plan Note (Addendum)
Elevated PA pressures. Previously declined further evaluation. Return to lasix, add metolazone.  Will again review cards referral.  Trouble with weights due to unsteadiness when standing on scale - I asked them to look into larger scale.

## 2017-10-04 ENCOUNTER — Ambulatory Visit: Payer: Self-pay | Admitting: *Deleted

## 2017-10-04 NOTE — Telephone Encounter (Addendum)
plz call to f/u - I don't see he's been seen at ER yet.  If unable to void, needs ER evaluation.

## 2017-10-04 NOTE — Telephone Encounter (Signed)
Pt's brother calling, on DPR, pt present during call. Reports pt on Lasix 40 mg BID and Zaroxolyn PRN. States has taken PRN med twice last week. States pt "Not urinating much since last Thursday"' Reports voiding small amounts every 5 hours. Abdomen "At belt line" distended, urine amber. Pt with SOB x  2 days, "Has to sit up at night." Reports groin, scrotum swollen as previously, "May be worse, he won't let us look." Reports pt weak, more fatigued than usual. States pt will not answer when asked where he is hurting "but asks for Bufferin" several times a day. Edema bilateral legs remain, not worse. Pt directed to ED. Brother states will follow disposition.   Reason for Disposition . [1] Unable to urinate (or only a few drops) > 4 hours AND     [2] bladder feels very full (e.g., palpable bladder or strong urge to urinate)  Answer Assessment - Initial Assessment Questions 1. SYMPTOM: "What's the main symptom you're concerned about?" (e.g., frequency, incontinence)     Not voiding much, "Small amount every 5 hours since last Thursday." 2. ONSET: "When did the   start?"     Thursday 3. PAIN: "Is there any pain?" If so, ask: "How bad is it?" (Scale: 1-10; mild, moderate, severe)     Unsure, Pt will not comment. "Asks for Bufferin all the time." 4. CAUSE: "What do you think is causing the symptoms?"     Retention 5. OTHER SYMPTOMS: "Do you have any other symptoms?" (e.g., fever, flank pain, blood in urine, pain with urination)     Amber, "Scrotum swollen" as previously, "May be worse."  SOB last 2 nights, has to sit up, weak, more fatigued than usual.  Protocols used: URINARY [facility]

## 2017-10-05 ENCOUNTER — Telehealth: Payer: Self-pay | Admitting: Family Medicine

## 2017-10-05 NOTE — Telephone Encounter (Signed)
Spoke with pt's niece, [name] (on dpr) relaying Dr. Synthia Innocent message.  Says pt is refusing to go to ER.  States pt is urinating maybe once every 5 hrs but they [family] cannot get him to go to the ER.

## 2017-10-05 NOTE — Telephone Encounter (Signed)
Left message on vm per dpr asking Debbie to call back letting Dr. Darnell Level know if pt is urinating a good amount when he does go.  Also, if pt still has swelling.

## 2017-10-05 NOTE — Telephone Encounter (Signed)
Copied from [place] [phone]. Topic: Quick Communication - See Telephone Encounter >> Oct 05, 2017  4:21 PM Bea Graff, NT wrote: CRM for notification. See Telephone encounter for: 10/05/17. Unable to add to previous message due to message being signed. Debbie, pts niece calling back and states patient is only able to void every 5 hours or so and he is urinating a urinal jug but not even filling up to 171mls. She states the urine is amber in color. Also that patient is still swelling. Spoke with Anastasiya at the office who spoke with Lattie Haw due to this and still advising patient to go to the ER. Niece states he is refusing, but they will try again to get him to go. Niece will follow-up with the office.

## 2017-10-05 NOTE — Telephone Encounter (Signed)
Spoke with [employer] Agree with ER disposition as there's nothing I can do in office to help with diuresis, needs kidney function monitored with stronger diuresis.

## 2017-10-05 NOTE — Telephone Encounter (Signed)
I spoke with [name] (DPR) in last 24 hours Debbie said pt has voided x 3 and Each time pt urinates he does not urinate more than 100 ml. Pt not going to restroom due to SOB cannot walk that far so using urinal.Pt still refusing to go to ED.pt is swollen in groin and looks tight in abdomen at belt. [name] is going to talk with pt again about going to ED. Advised pt should go to ED now. Please advise.

## 2017-10-06 ENCOUNTER — Telehealth: Payer: Self-pay

## 2017-10-06 NOTE — Telephone Encounter (Signed)
Left message for [name], niece, to call back. Patient needed to get back with Dr Juleen China per Dr Synthia Innocent note from last office visit on 09/24/17. Appointment made to see Dr Juleen China on 12/01/17 at 11:30 am. [place] location. Their office will reach out to the patient if something sooner opens up.-Purnell Daigle V Derrick Tiegs, RMA

## 2017-10-07 ENCOUNTER — Telehealth: Payer: Self-pay | Admitting: Family Medicine

## 2017-10-07 DIAGNOSIS — N400 Enlarged prostate without lower urinary tract symptoms: Secondary | ICD-10-CM | POA: Diagnosis not present

## 2017-10-07 DIAGNOSIS — E1122 Type 2 diabetes mellitus with diabetic chronic kidney disease: Secondary | ICD-10-CM | POA: Diagnosis not present

## 2017-10-07 DIAGNOSIS — Z7901 Long term (current) use of anticoagulants: Secondary | ICD-10-CM | POA: Diagnosis not present

## 2017-10-07 DIAGNOSIS — M1990 Unspecified osteoarthritis, unspecified site: Secondary | ICD-10-CM | POA: Diagnosis not present

## 2017-10-07 DIAGNOSIS — Z87891 Personal history of nicotine dependence: Secondary | ICD-10-CM | POA: Diagnosis not present

## 2017-10-07 DIAGNOSIS — D4621 Refractory anemia with excess of blasts 1: Secondary | ICD-10-CM

## 2017-10-07 DIAGNOSIS — E785 Hyperlipidemia, unspecified: Secondary | ICD-10-CM | POA: Diagnosis not present

## 2017-10-07 DIAGNOSIS — R531 Weakness: Secondary | ICD-10-CM | POA: Diagnosis not present

## 2017-10-07 DIAGNOSIS — R627 Adult failure to thrive: Secondary | ICD-10-CM

## 2017-10-07 DIAGNOSIS — N184 Chronic kidney disease, stage 4 (severe): Secondary | ICD-10-CM

## 2017-10-07 DIAGNOSIS — J449 Chronic obstructive pulmonary disease, unspecified: Secondary | ICD-10-CM | POA: Diagnosis not present

## 2017-10-07 DIAGNOSIS — Z9181 History of falling: Secondary | ICD-10-CM | POA: Diagnosis not present

## 2017-10-07 DIAGNOSIS — I4891 Unspecified atrial fibrillation: Secondary | ICD-10-CM

## 2017-10-07 DIAGNOSIS — I82591 Chronic embolism and thrombosis of other specified deep vein of right lower extremity: Secondary | ICD-10-CM | POA: Diagnosis not present

## 2017-10-07 DIAGNOSIS — Z85828 Personal history of other malignant neoplasm of skin: Secondary | ICD-10-CM | POA: Diagnosis not present

## 2017-10-07 DIAGNOSIS — H409 Unspecified glaucoma: Secondary | ICD-10-CM | POA: Diagnosis not present

## 2017-10-07 DIAGNOSIS — I129 Hypertensive chronic kidney disease with stage 1 through stage 4 chronic kidney disease, or unspecified chronic kidney disease: Secondary | ICD-10-CM | POA: Diagnosis not present

## 2017-10-07 DIAGNOSIS — I420 Dilated cardiomyopathy: Secondary | ICD-10-CM

## 2017-10-07 DIAGNOSIS — R600 Localized edema: Secondary | ICD-10-CM

## 2017-10-07 DIAGNOSIS — N183 Chronic kidney disease, stage 3 (moderate): Secondary | ICD-10-CM | POA: Diagnosis not present

## 2017-10-07 NOTE — Addendum Note (Signed)
Addended by: [name] on: 10/07/2017 04:50 PM   Modules accepted: Orders

## 2017-10-07 NOTE — Telephone Encounter (Addendum)
Will place stat hospice referral to see if we can call them today. Will forward to [place].  We have to be careful with kidneys but given severity of swelling let's try zaroxolyn 2.5mg  daily for next 3 days.

## 2017-10-07 NOTE — Telephone Encounter (Signed)
Called Hospice at 4:58 pm today. Unfortunately they cant open the Hospice referral today but they can open him for tomorrow. Hospice form on Dr Gutierrez's desk to be filled out and signed and returned to [place] to fax over to Hospice.Called [name] and left message that referral can be started tomorrow, 10/09/17 .

## 2017-10-07 NOTE — Telephone Encounter (Signed)
rec'd call from [place], [place] from [place].  Reported the family is requesting a referral for Hospice Care.  Reported the pt. has steadily declined, over the past 2 weeks.  Stated he has generalized edema, increased weakness, and is incontinent of bowel and bladder.  Reported abdominal distension, and scrotal edema to the size of a cantalope.  VS: BP 128/56, P. 52 (irreg.), R. 18, T. 95.4,  And Pulse Ox 96 %.  Lung sounds are diminished bilaterally, in bases.  Unable to weigh pt. due to his weakness.  The pt. has refused to go to the ER.  The [facility] RN reported she called Palliative Care, to try to expedite this level of care, and was told that the only way to expedite this, would be to obtain a Hospice referral.  Stated the pt's family is getting worn down, and needs some back up support.  Stated the niece, [name], can be reached  [phone], if there are any questions.   Advised will make Dr. Danise Mina aware.

## 2017-10-08 ENCOUNTER — Inpatient Hospital Stay: Payer: Medicare Other

## 2017-10-08 NOTE — Telephone Encounter (Signed)
Left message on vm per dpr relaying Dr. Synthia Innocent instructions for pt to take metolazone (Zaroxolyn) 2.5 mg once daily for the next 3 days.

## 2017-10-11 NOTE — Telephone Encounter (Signed)
PLEASE NOTE: All timestamps contained within this report are represented as [place] Standard Time. CONFIDENTIALTY NOTICE: This fax transmission is intended only for the addressee. It contains information that is legally privileged, confidential or otherwise protected from use or disclosure. If you are not the intended recipient, you are strictly prohibited from reviewing, disclosing, copying using or disseminating any of this information or taking any action in reliance on or regarding this information. If you have received this fax in error, please notify us immediately by telephone so that we can arrange for its return to [place]. Phone: [phone], Toll-Free: [phone], Fax: [phone] Page: 1 of 1 Call Id: [id number] [place] Night - Client Nonclinical Telephone Record [place] Night - Client Client Site [place] Physician [name] - MD Contact Type Call Call [place] Page Now Who Is [facility] [place] Name [place] Name [facility] Number [phone] Patient Name Pantelis Elgersma Patient DOB Dec 14, 1924 Reason for Call Symptomatic Patient Initial Comment Terri with Hospice needs verbal for comfort med order on a patient that was admitted today. Additional Comment Paging DoctorName Phone DateTime Result/Outcome Message Type Notes Roma Schanz- MD [phone] 10/09/2017 4:04:44 PM Paged On Call Back to Call Center Doctor Paged This is Elmyra Ricks at the call center. We have a page for you. Please call [phone] for details. Roma Schanz- MD 10/09/2017 4:18:45 PM Spoke with On Call - General Message Result Call Closed By: Nolon Bussing Transaction Date/Time: 10/09/2017 3:47:59 PM (ET)

## 2017-10-11 NOTE — Telephone Encounter (Signed)
Spoke with Charles Rodgers, and she asked for Dr [facility] appointment to be cancelled at this time. Hospice came out on Saturday and they are dealing with this right now. Patient is not better from last week. I cancelled appointment with Dr Juleen China. And sending this as an FYI to Dr Rona Ravens, [place]

## 2017-10-12 ENCOUNTER — Telehealth: Payer: Self-pay | Admitting: Family Medicine

## 2017-10-12 MED ORDER — HYOSCYAMINE SULFATE 0.125 MG PO TABS: 0.1250 mg | ORAL_TABLET | Freq: Four times a day (QID) | ORAL | 1 refills | Status: AC | PRN

## 2017-10-13 ENCOUNTER — Telehealth: Payer: Self-pay

## 2017-10-13 NOTE — Telephone Encounter (Signed)
PLEASE NOTE: All timestamps contained within this report are represented as [place] Standard Time. CONFIDENTIALTY NOTICE: This fax transmission is intended only for the addressee. It contains information that is legally privileged, confidential or otherwise protected from use or disclosure. If you are not the intended recipient, you are strictly prohibited from reviewing, disclosing, copying using or disseminating any of this information or taking any action in reliance on or regarding this information. If you have received this fax in error, please notify us immediately by telephone so that we can arrange for its return to [place]. Phone: [phone], Toll-Free: [phone], Fax: [phone] Page: 1 of 1 Call Id: [id number] [place] Night - Client Nonclinical Telephone Record [place] Night - Client Client Site [place] Physician [name] - MD Contact Type Call Call [place] Page Now Who Is Aiken / [place] Name [place] Name [place] Number [phone] Patient Name Ballard Budney Patient DOB 01/29/1925 Reason for Call Death Notification Initial Comment Caller is [facility] [place]. Pt has passed away. Additional Comment Paging DoctorName Phone DateTime Result/Outcome Message Type Notes Simonne Martinet - MD [phone] 10/13/17 11:09:59 PM Called On Call Provider - Reached Doctor Paged Simonne Martinet - MD 10/13/2017 11:10:14 PM Spoke with On Call - General Message Result Call Closed By: Zannie Kehr Transaction Date/Time: 10/13/2017 11:01:14 PM (ET)

## 2017-10-14 NOTE — Telephone Encounter (Signed)
Spoke with brother, expressed my condolences Death certificate filled out.

## 2017-10-15 ENCOUNTER — Inpatient Hospital Stay: Payer: Medicare Other | Admitting: Urgent Care

## 2017-10-15 ENCOUNTER — Inpatient Hospital Stay: Payer: Medicare Other

## 2017-10-28 NOTE — Telephone Encounter (Signed)
Copied from [place] [id number]. Topic: General - Other >> 10-23-2017  1:42 PM Lennox Solders wrote: Reason for CRM: toni rn [facility] is calling and would like rx for secretion hyoscyamine . [facility]

## 2017-10-28 NOTE — Telephone Encounter (Signed)
Hyoscyamine sent to pharmacy.  plz notify Vivien Rota.

## 2017-10-28 NOTE — Telephone Encounter (Signed)
Left message on vm for [facility] with [facility] notifying her rx was sent to [facility].

## 2017-10-28 DEATH — deceased

## 2019-04-24 IMAGING — CR DG HIP (WITH OR WITHOUT PELVIS) 2-3V*R*
3 series · 3 of 3 positions shown · non-contrast
Comparison: None.

CLINICAL DATA: Acute bilateral hip pain after fall today.

EXAM:
DG HIP (WITH OR WITHOUT PELVIS) 2-3V RIGHT

[hip ap]
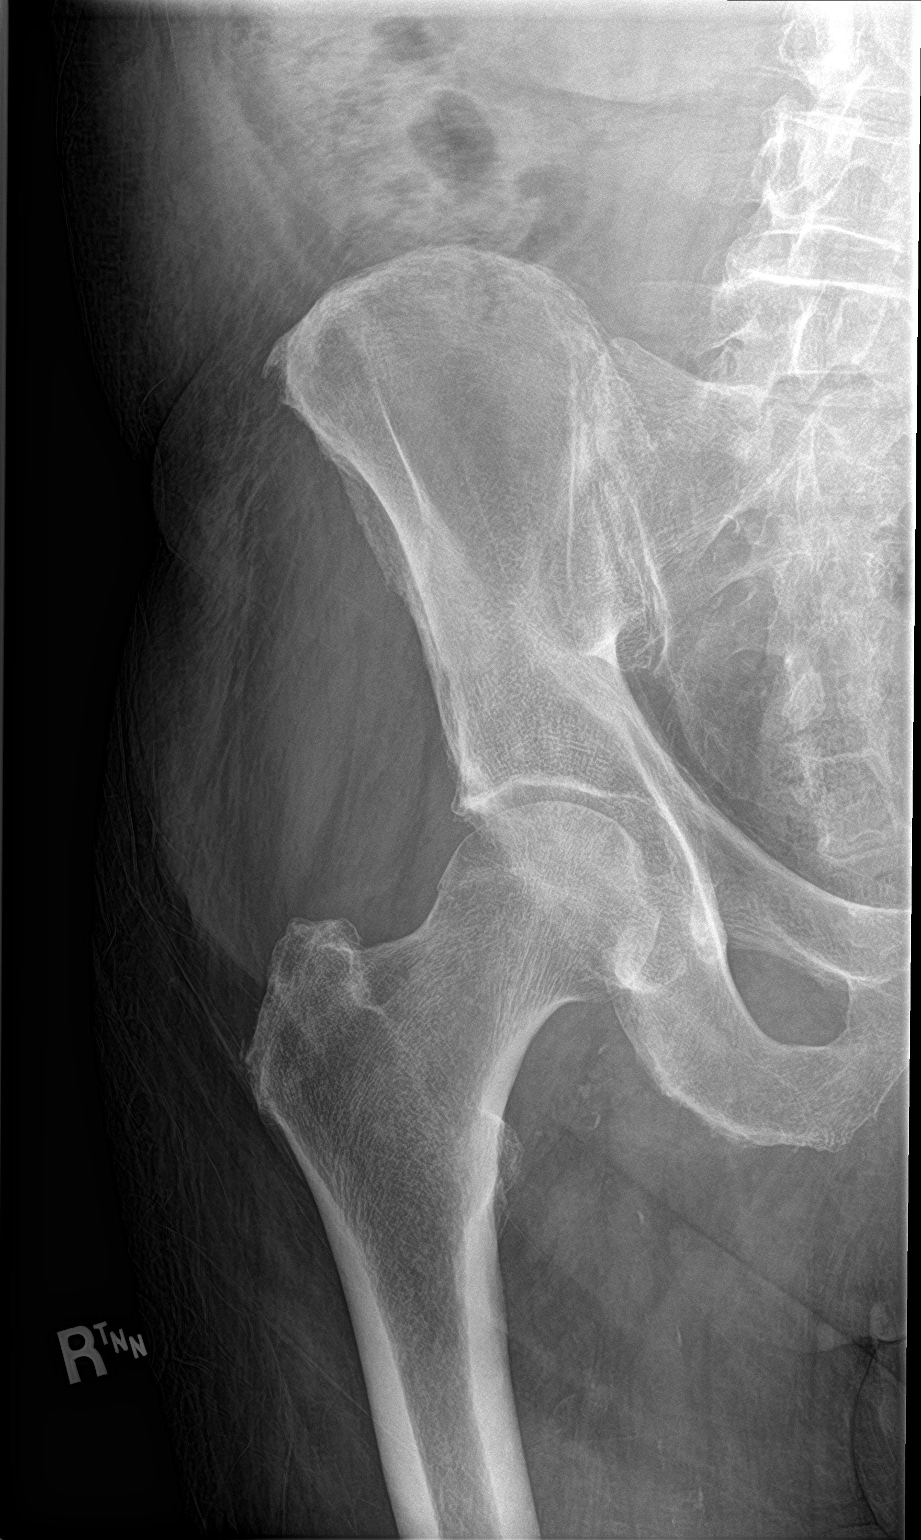

[pelvis ap]
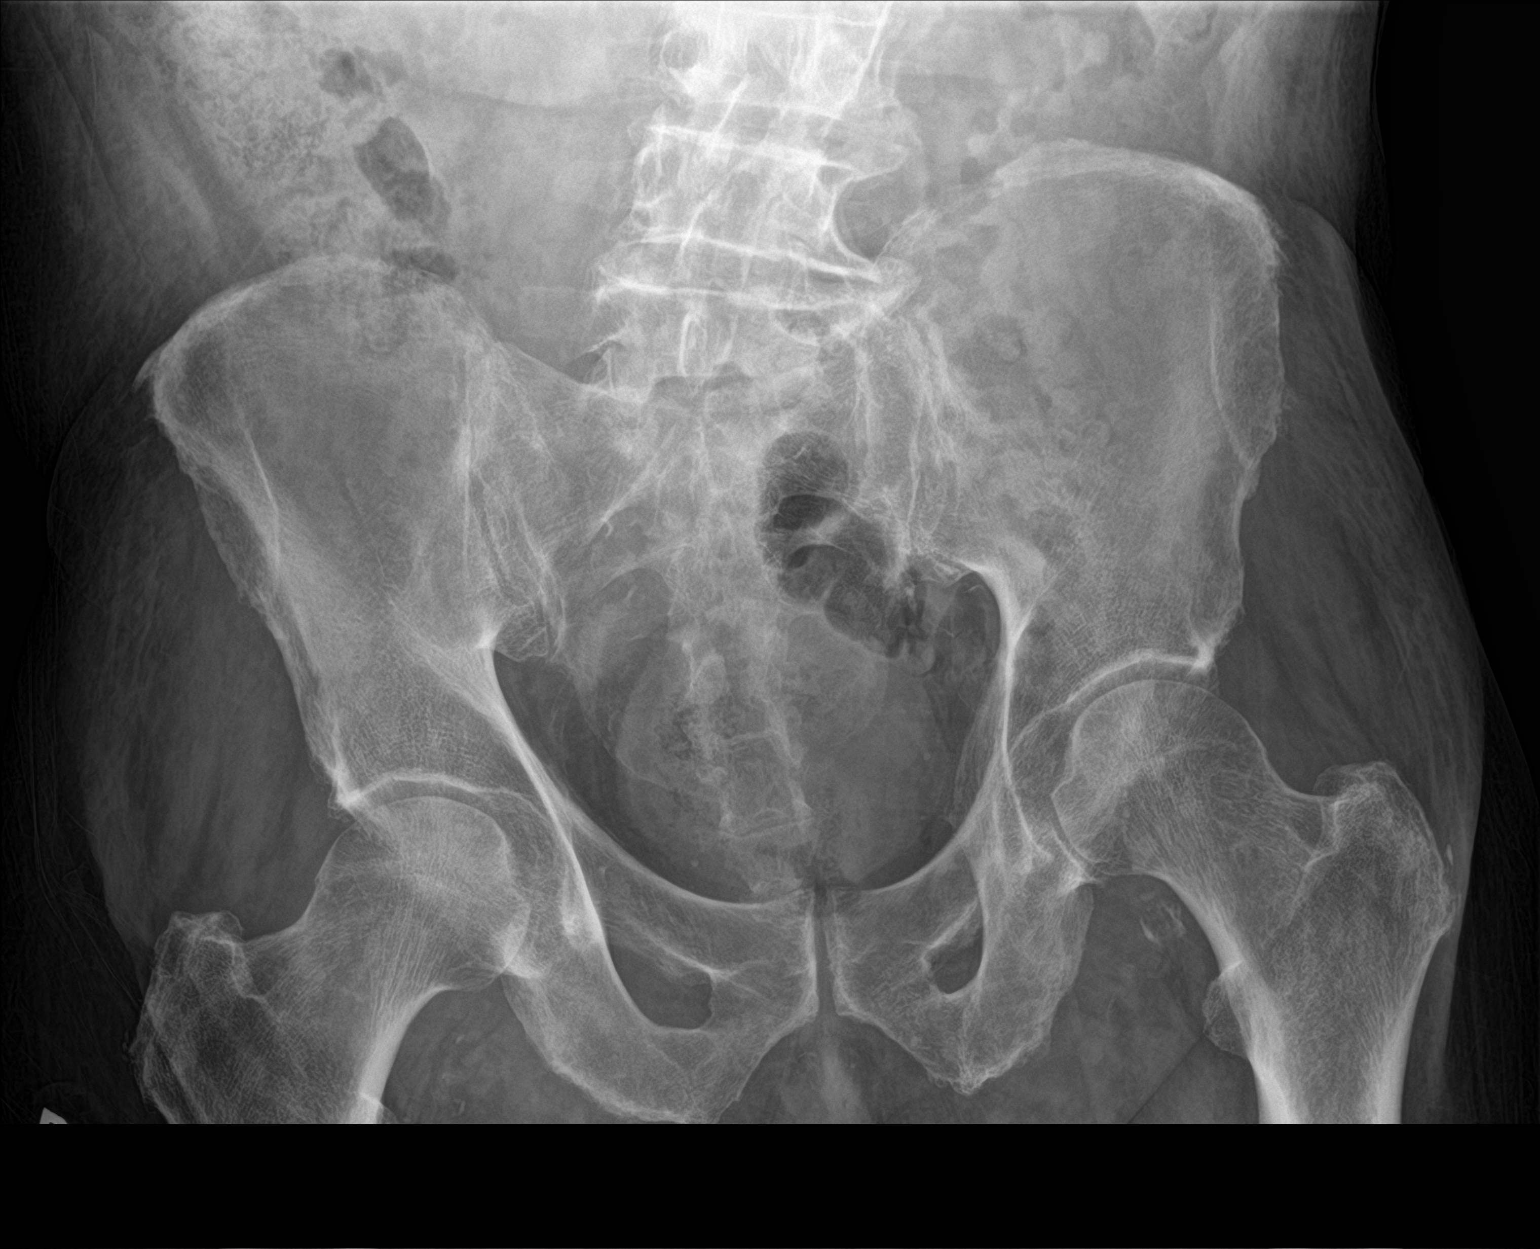

[hip lat]
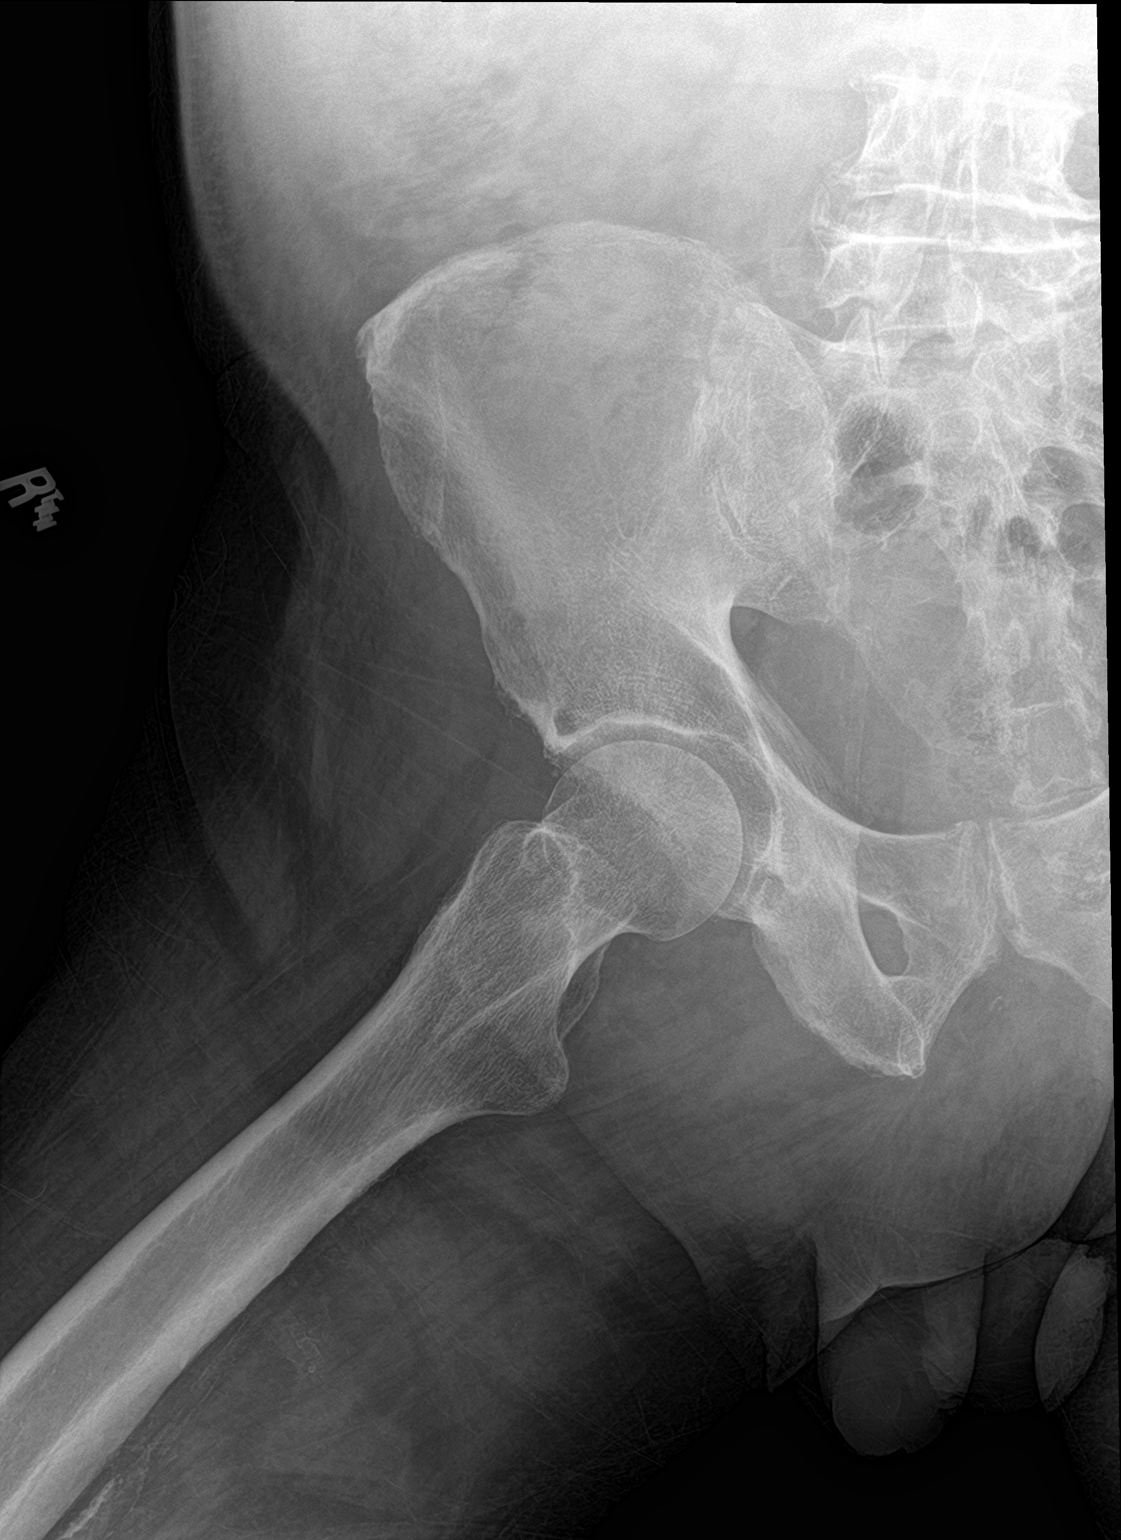

[3 of 3 positions shown; findings below may reference images not displayed]

FINDINGS: There is no evidence of hip fracture or dislocation. There is no
evidence of arthropathy or other focal bone abnormality.
IMPRESSION: Normal right hip.

## 2019-04-24 IMAGING — CR DG HIP (WITH OR WITHOUT PELVIS) 2-3V*L*
3 series · 3 of 3 positions shown · non-contrast
Comparison: None.

CLINICAL DATA: Acute bilateral hip pain after fall today.

EXAM:
DG HIP (WITH OR WITHOUT PELVIS) 2-3V LEFT

[pelvis ap]
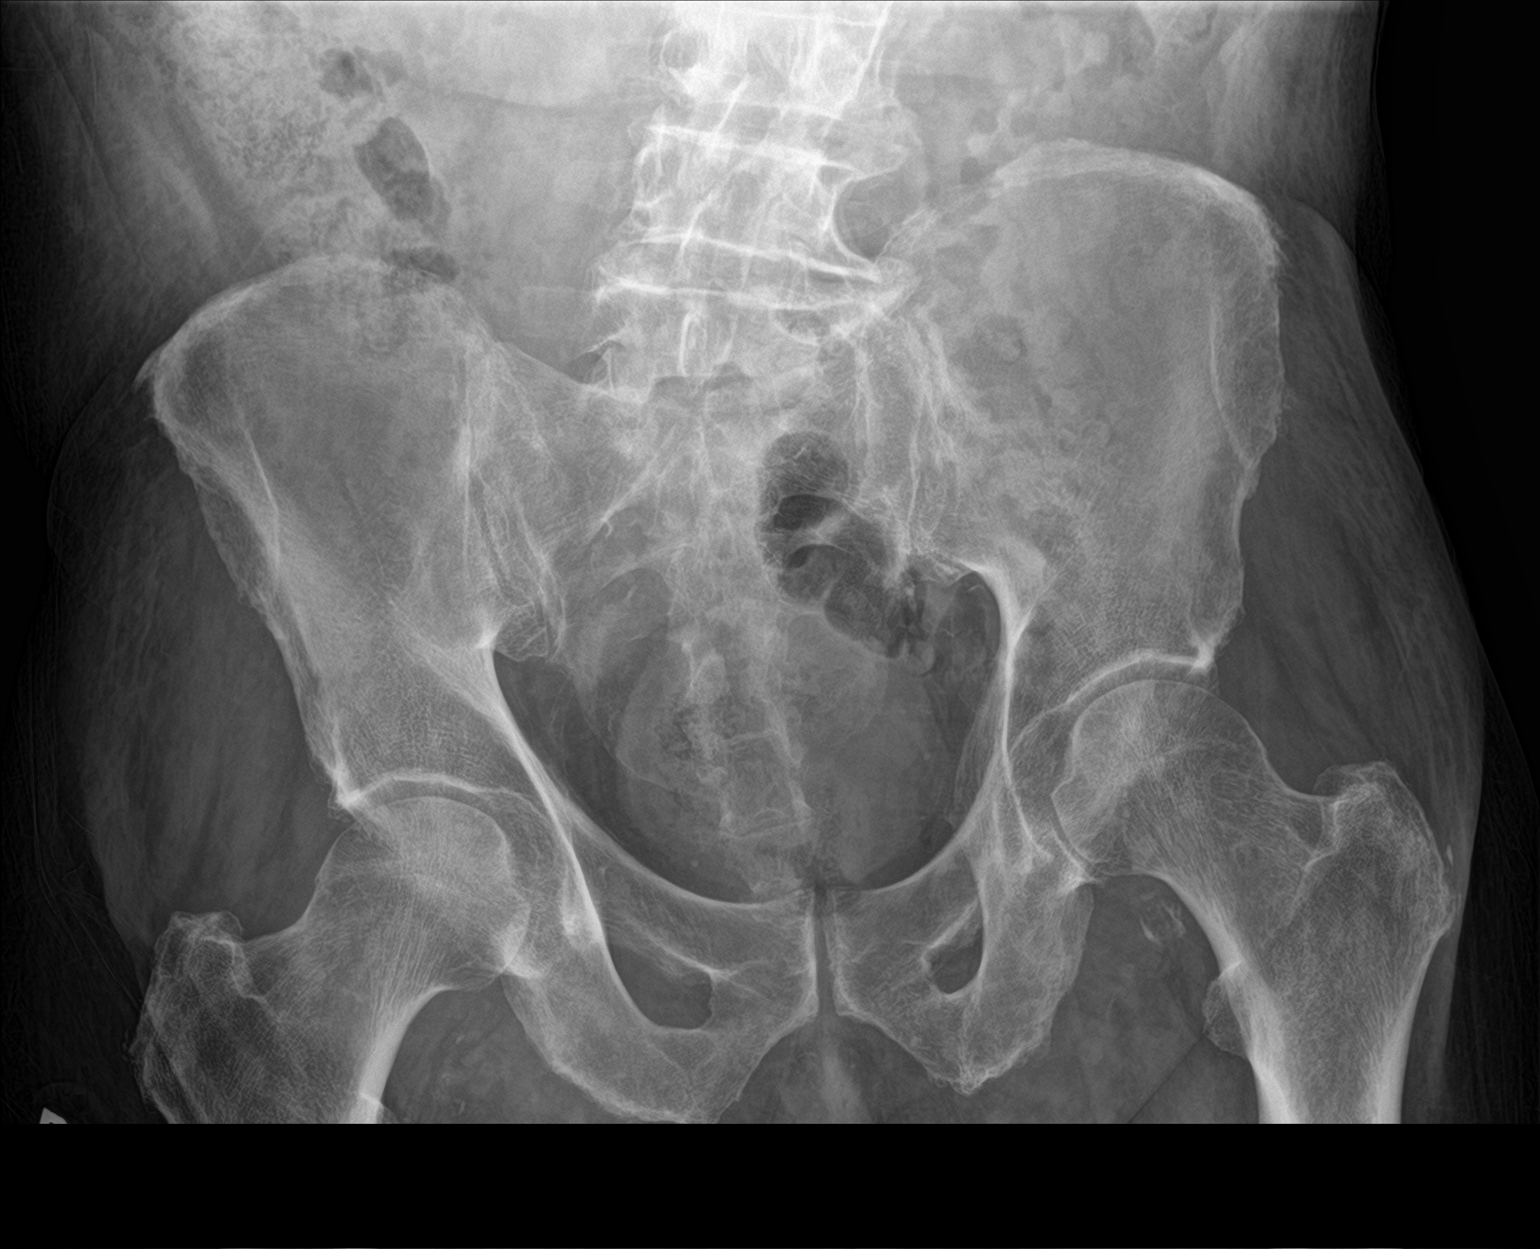

[hip ap]
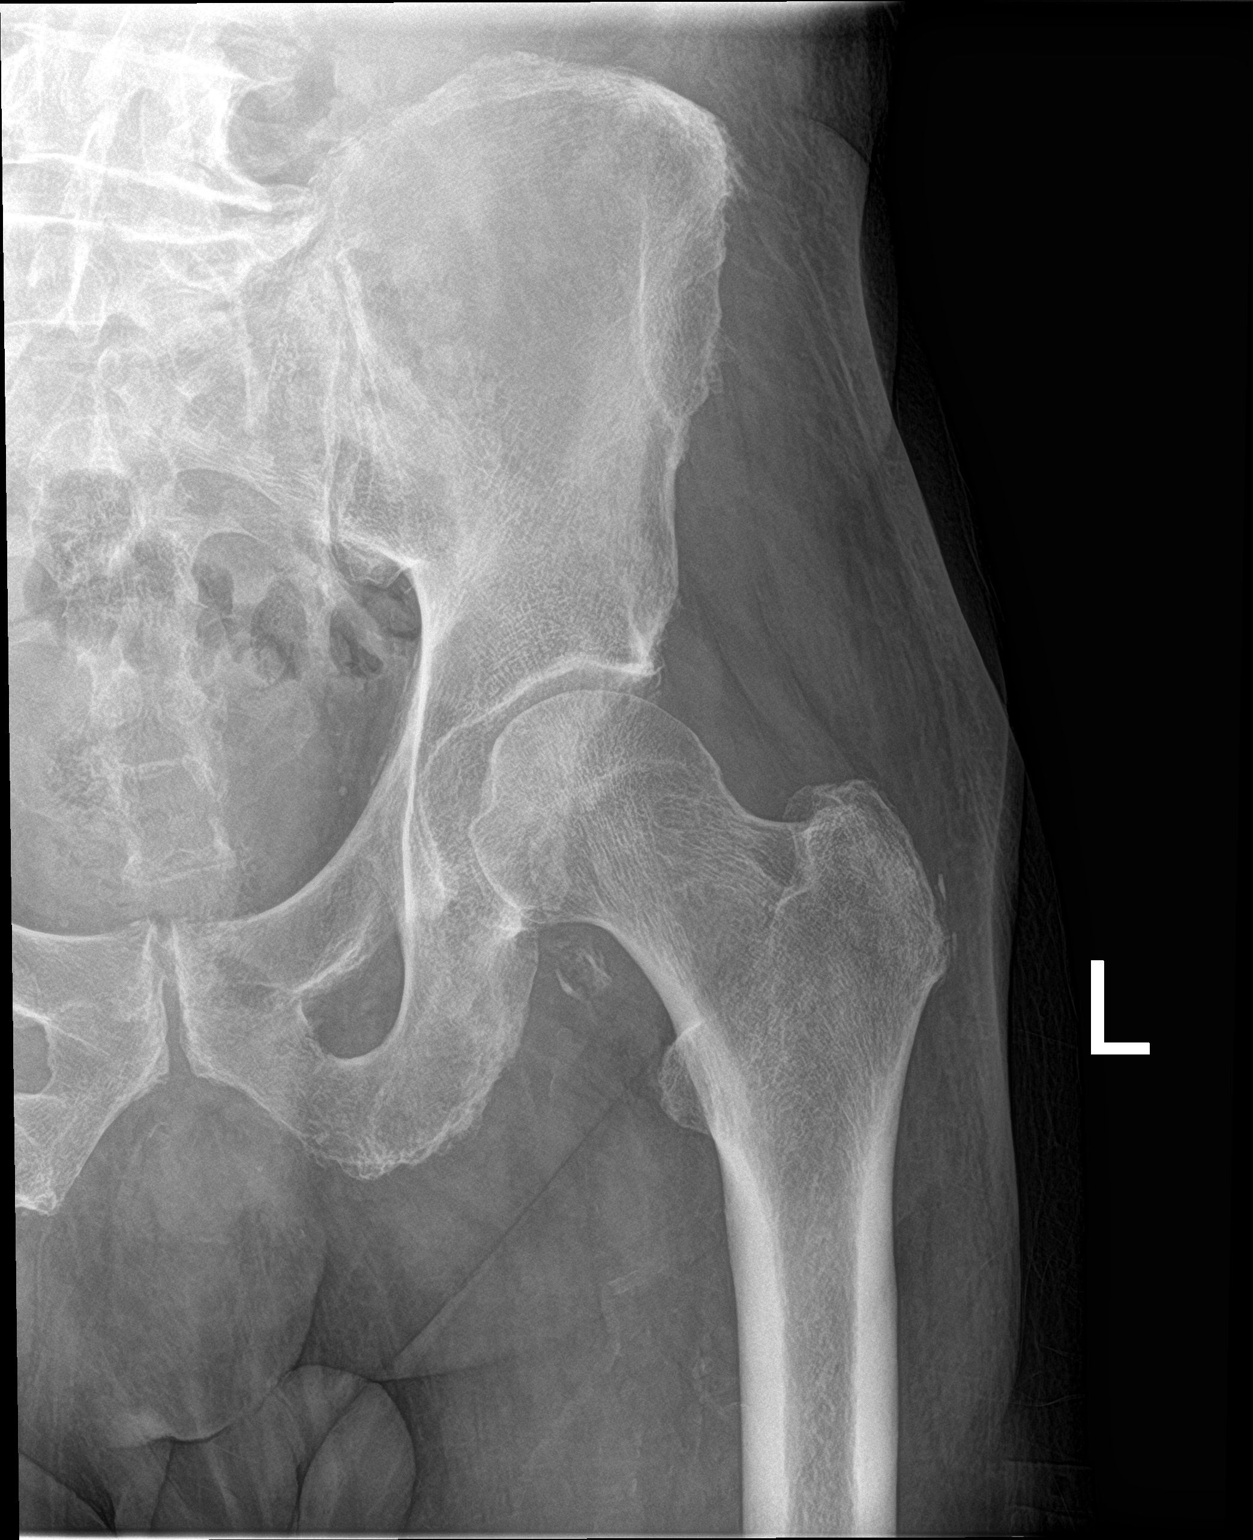

[hip lat]
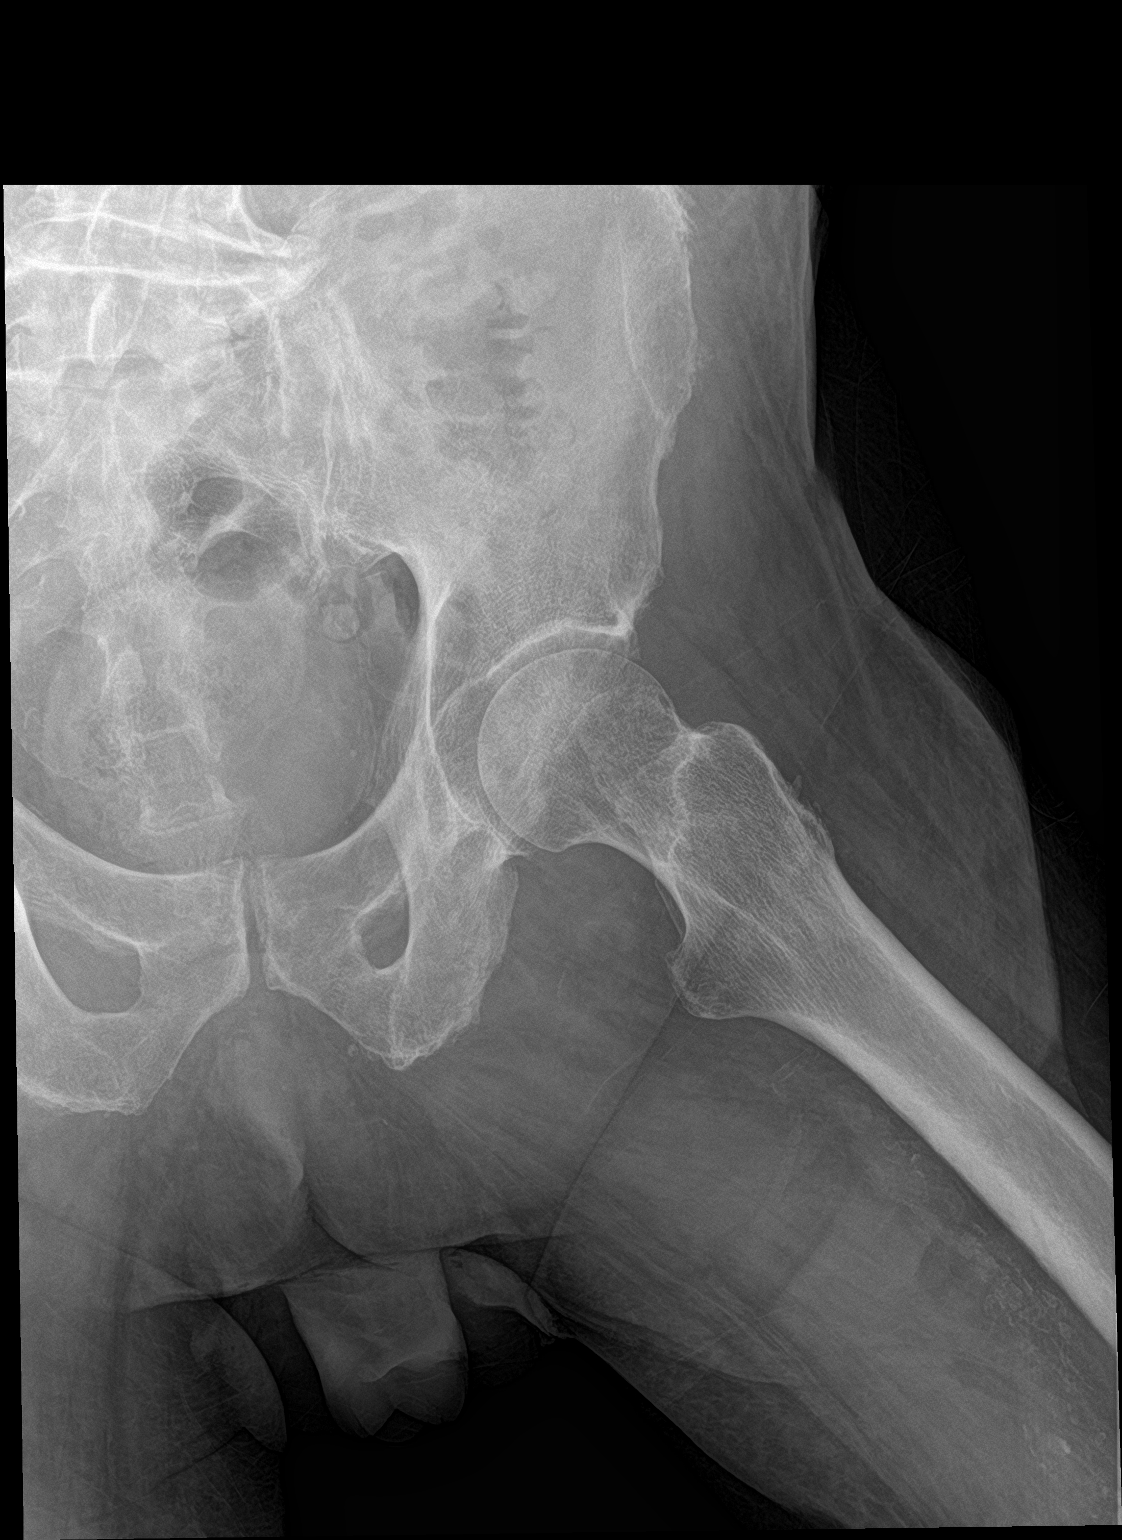

[3 of 3 positions shown; findings below may reference images not displayed]

FINDINGS: There is no evidence of hip fracture or dislocation. There is no
evidence of arthropathy or other focal bone abnormality.
IMPRESSION: Normal left hip.
